# Patient Record
Sex: Male | Born: 1945 | Race: Black or African American | Hispanic: No | Marital: Married
Health system: Southern US, Community
[De-identification: ages and names within clinical notes are randomized; demographics above are authoritative.]

## PROBLEM LIST (undated history)

## (undated) DIAGNOSIS — J9621 Acute and chronic respiratory failure with hypoxia: Secondary | ICD-10-CM

## (undated) DIAGNOSIS — N179 Acute kidney failure, unspecified: Secondary | ICD-10-CM

## (undated) DIAGNOSIS — N183 Chronic kidney disease, stage 3 unspecified: Secondary | ICD-10-CM

## (undated) DIAGNOSIS — I5043 Acute on chronic combined systolic (congestive) and diastolic (congestive) heart failure: Secondary | ICD-10-CM

## (undated) DIAGNOSIS — J189 Pneumonia, unspecified organism: Secondary | ICD-10-CM

## (undated) DIAGNOSIS — J449 Chronic obstructive pulmonary disease, unspecified: Secondary | ICD-10-CM

---

## 2019-02-15 ENCOUNTER — Inpatient Hospital Stay
Admission: RE | Admit: 2019-02-15 | Discharge: 2019-04-21 | Disposition: A | Discharge status: Skilled Nursing Facility | Payer: Medicare Other | Source: Other Acute Inpatient Hospital | Attending: Internal Medicine | Admitting: Internal Medicine

## 2019-02-15 ENCOUNTER — Other Ambulatory Visit (HOSPITAL_COMMUNITY): Payer: Medicare Other

## 2019-02-15 DIAGNOSIS — N179 Acute kidney failure, unspecified: Secondary | ICD-10-CM | POA: Diagnosis present

## 2019-02-15 DIAGNOSIS — J90 Pleural effusion, not elsewhere classified: Secondary | ICD-10-CM

## 2019-02-15 DIAGNOSIS — J189 Pneumonia, unspecified organism: Secondary | ICD-10-CM | POA: Diagnosis present

## 2019-02-15 DIAGNOSIS — I770 Arteriovenous fistula, acquired: Secondary | ICD-10-CM

## 2019-02-15 DIAGNOSIS — J449 Chronic obstructive pulmonary disease, unspecified: Secondary | ICD-10-CM | POA: Diagnosis present

## 2019-02-15 DIAGNOSIS — J811 Chronic pulmonary edema: Secondary | ICD-10-CM

## 2019-02-15 DIAGNOSIS — J939 Pneumothorax, unspecified: Secondary | ICD-10-CM

## 2019-02-15 DIAGNOSIS — Z992 Dependence on renal dialysis: Secondary | ICD-10-CM

## 2019-02-15 DIAGNOSIS — R509 Fever, unspecified: Secondary | ICD-10-CM

## 2019-02-15 DIAGNOSIS — I509 Heart failure, unspecified: Secondary | ICD-10-CM

## 2019-02-15 DIAGNOSIS — I5043 Acute on chronic combined systolic (congestive) and diastolic (congestive) heart failure: Secondary | ICD-10-CM | POA: Diagnosis present

## 2019-02-15 DIAGNOSIS — J9621 Acute and chronic respiratory failure with hypoxia: Secondary | ICD-10-CM | POA: Diagnosis present

## 2019-02-15 DIAGNOSIS — J9690 Respiratory failure, unspecified, unspecified whether with hypoxia or hypercapnia: Secondary | ICD-10-CM

## 2019-02-15 DIAGNOSIS — K269 Duodenal ulcer, unspecified as acute or chronic, without hemorrhage or perforation: Secondary | ICD-10-CM

## 2019-02-15 DIAGNOSIS — Z431 Encounter for attention to gastrostomy: Secondary | ICD-10-CM

## 2019-02-15 DIAGNOSIS — Z9911 Dependence on respirator [ventilator] status: Secondary | ICD-10-CM

## 2019-02-15 DIAGNOSIS — D500 Iron deficiency anemia secondary to blood loss (chronic): Secondary | ICD-10-CM

## 2019-02-15 DIAGNOSIS — Z9889 Other specified postprocedural states: Secondary | ICD-10-CM

## 2019-02-15 HISTORY — DX: Chronic obstructive pulmonary disease, unspecified: J44.9

## 2019-02-15 HISTORY — DX: Acute kidney failure, unspecified: N17.9

## 2019-02-15 HISTORY — DX: Acute and chronic respiratory failure with hypoxia: J96.21

## 2019-02-15 HISTORY — DX: Acute on chronic combined systolic (congestive) and diastolic (congestive) heart failure: I50.43

## 2019-02-15 HISTORY — DX: Chronic kidney disease, stage 3 unspecified: N18.30

## 2019-02-15 HISTORY — DX: Pneumonia, unspecified organism: J18.9

## 2019-02-15 LAB — BLOOD GAS, ARTERIAL
Acid-Base Excess: 3.3 mmol/L — ABNORMAL HIGH (ref 0.0–2.0)
Bicarbonate: 27.8 mmol/L (ref 20.0–28.0)
FIO2: 35
O2 Saturation: 81.7 %
PEEP: 8 cmH2O
Patient temperature: 98.6
RATE: 16 resp/min
pCO2 arterial: 46.3 mmHg (ref 32.0–48.0)
pH, Arterial: 7.396 (ref 7.350–7.450)
pO2, Arterial: 51.8 mmHg — ABNORMAL LOW (ref 83.0–108.0)

## 2019-02-15 MED ORDER — IOHEXOL 300 MG/ML  SOLN: 40.0000 mL | Freq: Once | INTRAMUSCULAR | Status: DC | PRN

## 2019-02-16 ENCOUNTER — Other Ambulatory Visit (HOSPITAL_COMMUNITY): Payer: Medicare Other

## 2019-02-16 DIAGNOSIS — J9621 Acute and chronic respiratory failure with hypoxia: Secondary | ICD-10-CM

## 2019-02-16 DIAGNOSIS — N179 Acute kidney failure, unspecified: Secondary | ICD-10-CM | POA: Diagnosis not present

## 2019-02-16 DIAGNOSIS — J189 Pneumonia, unspecified organism: Secondary | ICD-10-CM

## 2019-02-16 DIAGNOSIS — N183 Chronic kidney disease, stage 3 unspecified: Secondary | ICD-10-CM

## 2019-02-16 DIAGNOSIS — I5043 Acute on chronic combined systolic (congestive) and diastolic (congestive) heart failure: Secondary | ICD-10-CM | POA: Diagnosis not present

## 2019-02-16 DIAGNOSIS — J449 Chronic obstructive pulmonary disease, unspecified: Secondary | ICD-10-CM | POA: Diagnosis not present

## 2019-02-16 LAB — HEMOGLOBIN A1C
Hgb A1c MFr Bld: 6.7 % — ABNORMAL HIGH (ref 4.8–5.6)
Mean Plasma Glucose: 145.59 mg/dL

## 2019-02-16 LAB — COMPREHENSIVE METABOLIC PANEL
ALT: 15 U/L (ref 0–44)
AST: 21 U/L (ref 15–41)
Albumin: 1.9 g/dL — ABNORMAL LOW (ref 3.5–5.0)
Alkaline Phosphatase: 112 U/L (ref 38–126)
Anion gap: 15 (ref 5–15)
BUN: 152 mg/dL — ABNORMAL HIGH (ref 8–23)
CO2: 25 mmol/L (ref 22–32)
Calcium: 9 mg/dL (ref 8.9–10.3)
Chloride: 102 mmol/L (ref 98–111)
Creatinine, Ser: 4.54 mg/dL — ABNORMAL HIGH (ref 0.61–1.24)
GFR calc Af Amer: 14 mL/min — ABNORMAL LOW (ref >60)
GFR calc non Af Amer: 12 mL/min — ABNORMAL LOW (ref >60)
Glucose, Bld: 182 mg/dL — ABNORMAL HIGH (ref 70–99)
Potassium: 4.9 mmol/L (ref 3.5–5.1)
Sodium: 142 mmol/L (ref 135–145)
Total Bilirubin: 1.1 mg/dL (ref 0.3–1.2)
Total Protein: 6 g/dL — ABNORMAL LOW (ref 6.5–8.1)

## 2019-02-16 LAB — CBC WITH DIFFERENTIAL/PLATELET
Abs Immature Granulocytes: 0.04 10*3/uL (ref 0.00–0.07)
Basophils Absolute: 0 10*3/uL (ref 0.0–0.1)
Basophils Relative: 0 %
Eosinophils Absolute: 0 10*3/uL (ref 0.0–0.5)
Eosinophils Relative: 0 %
HCT: 27 % — ABNORMAL LOW (ref 39.0–52.0)
Hemoglobin: 8.6 g/dL — ABNORMAL LOW (ref 13.0–17.0)
Immature Granulocytes: 1 %
Lymphocytes Relative: 8 %
Lymphs Abs: 0.7 10*3/uL (ref 0.7–4.0)
MCH: 27.5 pg (ref 26.0–34.0)
MCHC: 31.9 g/dL (ref 30.0–36.0)
MCV: 86.3 fL (ref 80.0–100.0)
Monocytes Absolute: 0.5 10*3/uL (ref 0.1–1.0)
Monocytes Relative: 6 %
Neutro Abs: 7.4 10*3/uL (ref 1.7–7.7)
Neutrophils Relative %: 85 %
Platelets: 185 10*3/uL (ref 150–400)
RBC: 3.13 MIL/uL — ABNORMAL LOW (ref 4.22–5.81)
RDW: 15.6 % — ABNORMAL HIGH (ref 11.5–15.5)
WBC: 8.6 10*3/uL (ref 4.0–10.5)
nRBC: 0 % (ref 0.0–0.2)

## 2019-02-16 LAB — URINALYSIS, ROUTINE W REFLEX MICROSCOPIC
Bilirubin Urine: NEGATIVE
Glucose, UA: NEGATIVE mg/dL
Ketones, ur: NEGATIVE mg/dL
Leukocytes,Ua: NEGATIVE
Nitrite: NEGATIVE
Protein, ur: 100 mg/dL — AB
Specific Gravity, Urine: 1.014 (ref 1.005–1.030)
pH: 5 (ref 5.0–8.0)

## 2019-02-16 LAB — MAGNESIUM: Magnesium: 3.2 mg/dL — ABNORMAL HIGH (ref 1.7–2.4)

## 2019-02-16 LAB — PROTIME-INR
INR: 1.3 — ABNORMAL HIGH (ref 0.8–1.2)
Prothrombin Time: 15.7 seconds — ABNORMAL HIGH (ref 11.4–15.2)

## 2019-02-16 LAB — PHOSPHORUS: Phosphorus: 5.2 mg/dL — ABNORMAL HIGH (ref 2.5–4.6)

## 2019-02-16 LAB — TSH: TSH: 2.629 u[IU]/mL (ref 0.350–4.500)

## 2019-02-16 LAB — SARS CORONAVIRUS 2 BY RT PCR (HOSPITAL ORDER, PERFORMED IN ~~LOC~~ HOSPITAL LAB): SARS Coronavirus 2: NEGATIVE

## 2019-02-16 MED ORDER — GENERIC EXTERNAL MEDICATION
0.04 | Status: DC
Start: ? — End: 2019-02-16

## 2019-02-16 MED ORDER — ACETAMINOPHEN 325 MG PO TABS
650.00 | ORAL_TABLET | ORAL | Status: DC
Start: ? — End: 2019-02-16

## 2019-02-16 MED ORDER — INSULIN LISPRO 100 UNIT/ML ~~LOC~~ SOLN
1.00 | SUBCUTANEOUS | Status: DC
Start: ? — End: 2019-02-16

## 2019-02-16 MED ORDER — BISACODYL 10 MG RE SUPP
10.00 | RECTAL | Status: DC
Start: 2019-02-15 — End: 2019-02-16

## 2019-02-16 MED ORDER — SODIUM CHLORIDE 0.9 % IV SOLN
10.00 | INTRAVENOUS | Status: DC
Start: ? — End: 2019-02-16

## 2019-02-16 MED ORDER — INSULIN LISPRO 100 UNIT/ML ~~LOC~~ SOLN
1.00 | SUBCUTANEOUS | Status: DC
Start: 2019-02-15 — End: 2019-02-16

## 2019-02-16 MED ORDER — GENERIC EXTERNAL MEDICATION
Status: DC
Start: ? — End: 2019-02-16

## 2019-02-16 MED ORDER — IPRATROPIUM-ALBUTEROL 0.5-2.5 (3) MG/3ML IN SOLN
3.00 | RESPIRATORY_TRACT | Status: DC
Start: ? — End: 2019-02-16

## 2019-02-16 MED ORDER — METOPROLOL TARTRATE 25 MG PO TABS
12.50 | ORAL_TABLET | ORAL | Status: DC
Start: 2019-02-15 — End: 2019-02-16

## 2019-02-16 MED ORDER — HYDROCORTISONE NA SUCCINATE PF 100 MG IJ SOLR
50.00 | INTRAMUSCULAR | Status: DC
Start: 2019-02-16 — End: 2019-02-16

## 2019-02-16 MED ORDER — SODIUM CHLORIDE (PF) 0.9 % IJ SOLN
10.00 | INTRAMUSCULAR | Status: DC
Start: 2019-02-15 — End: 2019-02-16

## 2019-02-16 MED ORDER — FENTANYL CITRATE (PF) 2500 MCG/50ML IJ SOLN
50.00 | INTRAMUSCULAR | Status: DC
Start: ? — End: 2019-02-16

## 2019-02-16 MED ORDER — DONEPEZIL HCL 5 MG PO TABS
10.00 | ORAL_TABLET | ORAL | Status: DC
Start: 2019-02-15 — End: 2019-02-16

## 2019-02-16 MED ORDER — IPRATROPIUM-ALBUTEROL 0.5-2.5 (3) MG/3ML IN SOLN
3.00 | RESPIRATORY_TRACT | Status: DC
Start: 2019-02-15 — End: 2019-02-16

## 2019-02-16 MED ORDER — ACETAMINOPHEN 160 MG/5ML PO SUSP
650.00 | ORAL | Status: DC
Start: ? — End: 2019-02-16

## 2019-02-16 MED ORDER — FENTANYL CITRATE (PF) 2500 MCG/50ML IJ SOLN
100.00 | INTRAMUSCULAR | Status: DC
Start: ? — End: 2019-02-16

## 2019-02-16 MED ORDER — ENOXAPARIN SODIUM 30 MG/0.3ML ~~LOC~~ SOLN
30.00 | SUBCUTANEOUS | Status: DC
Start: 2019-02-15 — End: 2019-02-16

## 2019-02-16 MED ORDER — NYSTATIN 100000 UNIT/ML MT SUSP
500000.00 | OROMUCOSAL | Status: DC
Start: 2019-02-15 — End: 2019-02-16

## 2019-02-16 MED ORDER — ISOSORBIDE DINITRATE 20 MG PO TABS
20.00 | ORAL_TABLET | ORAL | Status: DC
Start: ? — End: 2019-02-16

## 2019-02-16 MED ORDER — DOCUSATE SODIUM 150 MG/15ML PO LIQD
100.00 | ORAL | Status: DC
Start: 2019-02-15 — End: 2019-02-16

## 2019-02-16 MED ORDER — GENERIC EXTERNAL MEDICATION
0.08 | Status: DC
Start: ? — End: 2019-02-16

## 2019-02-16 MED ORDER — PROPOFOL 200 MG/20ML IV EMUL
1.00 | INTRAVENOUS | Status: DC
Start: ? — End: 2019-02-16

## 2019-02-16 MED ORDER — LORAZEPAM 2 MG/ML PO CONC
1.00 | ORAL | Status: DC
Start: ? — End: 2019-02-16

## 2019-02-16 MED ORDER — INSULIN GLARGINE 100 UNIT/ML ~~LOC~~ SOLN
1.00 | SUBCUTANEOUS | Status: DC
Start: ? — End: 2019-02-16

## 2019-02-16 MED ORDER — FENTANYL CITRATE-NACL 2.5-0.9 MG/250ML-% IV SOLN
1.00 | INTRAVENOUS | Status: DC
Start: ? — End: 2019-02-16

## 2019-02-16 MED ORDER — FENTANYL CITRATE (PF) 2500 MCG/50ML IJ SOLN
25.00 | INTRAMUSCULAR | Status: DC
Start: ? — End: 2019-02-16

## 2019-02-16 MED ORDER — INSULIN GLARGINE 100 UNIT/ML ~~LOC~~ SOLN
1.00 | SUBCUTANEOUS | Status: DC
Start: 2019-02-15 — End: 2019-02-16

## 2019-02-16 MED ORDER — ACETAMINOPHEN 650 MG RE SUPP
650.00 | RECTAL | Status: DC
Start: ? — End: 2019-02-16

## 2019-02-16 MED ORDER — LABETALOL HCL 5 MG/ML IV SOLN
10.00 | INTRAVENOUS | Status: DC
Start: ? — End: 2019-02-16

## 2019-02-16 NOTE — Consult Note (Signed)
[facility]  Date of Service: 02/16/2019  PULMONARY CRITICAL CARE CONSULT   Paul Ball  [id number]  DOB: 1945/09/26   DOA: 02/15/2019  Referring Physician: [name], MD  HPI: Krew Amadio is a 73 y.o. male seen for follow up of Acute on Chronic Respiratory Failure.  Patient has multiple medical problems including systolic and diastolic heart failure history of stroke carotid artery stenosis COPD diabetes depression BPH has been having admissions to the hospital was recently admitted for respiratory failure and treated and discharged.  At that time chest x-ray had shown a right lower lobe pneumonia and effusion.  The patient was treated in the ICU for volume overload.  Patient was discharged August 23 at that time also had been found to have a stroke and heart failure.  Patient was admitted this time around into the ICU on the ventilator for respiratory failure.  Started on IV antibiotics steroids and bronchodilators.  Patient had spontaneous breathing trials which the patient failed and increased work of breathing shortness of breath and ended up having to go back on the ventilator.  Subsequently self extubated had increased work of breathing was placed on BiPAP however did not tolerate for prolonged period of time and is ended up back on the ventilator.  Subsequently patient was found to not be able to wean so therefore had a tracheostomy done and was transferred to our facility for further management.  At the time the patient is seen is on full support without any distress  Review of Systems:  ROS performed and is unremarkable other than noted above.  Past Medical History:  Diagnosis Date  . Abnormal EKG 10/26/2012  Questionable old anterioseptal infarct?  . Acid reflux disease 09/29/2005  With history of H. pylori  . Allergic rhinitis 09/30/2011  . Anemia 09/30/2011  . Anemia, unspecified 09/30/2011  . Benign  prostatic hyperplasia with weak urinary stream 09/30/2011  10/1 IMO update  . Benign prostatic hypertrophy (BPH) with weak urinary stream 09/30/2011  . BPH (benign prostatic hypertrophy) 09/30/2011  . Chronic nonseasonal allergic rhinitis due to pollen 09/30/2011  . Cognitive disorder 07/10/2016  . COPD (chronic obstructive pulmonary disease) (*) 09/30/2007  . COPD mixed type (*) 05/07/2018  . Diabetes mellitus (*) 09/30/2011  . 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 Diabetes type 2, controlled (*) 10/26/2012  . Essential hypertension with goal blood pressure less than 130/85 09/30/2011  . Gastroesophageal reflux disease without esophagitis 09/29/2005  With history of H. pylori  . Hemorrhoids 09/30/2011  . History of adenomatous polyp of colon 07/17/2016  Colonoscopy 2018 by Dr Dalbert Garnet with adenomas. Repeat colonoscopy 2023 for surveillance  . Hypertension 09/30/2011  . Hypertension associated with diabetes (*) 09/30/2011  . Medicare annual wellness visit, initial 10/26/2012  10-26-12  . Missing teeth, acquired  . Nicotine use disorder 09/30/2011  . Open-angle glaucoma 09/30/2011  . Seborrheic dermatitis 09/30/2011  . Tobacco use disorder 09/30/2011  . 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 Harl Bowie May 2016  . Type 2 diabetes mellitus with microalbuminuria (*) 04/17/2014  . Type 2 diabetes mellitus with microalbuminuria, with long-term current use of insulin (*) 04/17/2014  . Type 2 diabetes mellitus with mild nonproliferative retinopathy without macular edema, with long-term current use of insulin (*) 10/26/2012  Dr. Harl Bowie May 2016  Past Surgical History:  Procedure Laterality Date  . Colonoscopy  . Eye surgery  left cataract surgery in 2011, right cataract surgery Dr. Harl Bowie   Allergies  Allergen  Reactions  . Shellfish Allergy Anaphylaxis     Medications: Reviewed on Rounds  Physical Exam:  Vitals: Temperature 97.4 pulse 83 respiratory 22 blood pressure 160/55 saturations 95%  Ventilator Settings mode ventilation pressure assist control FiO2 35% tidal volume 314 PEEP 6  . General: Comfortable at this time . Eyes: Grossly normal lids, irises & conjunctiva . ENT: grossly tongue is normal . Neck: no obvious mass . Cardiovascular: S1-S2 normal no gallop or rub . Respiratory: No rhonchi no rales are noted . Abdomen: Soft nontender . Skin: no rash seen on limited exam . Musculoskeletal: not rigid . Psychiatric:unable to assess . Neurologic: no seizure no involuntary movements         Labs on Admission:  Basic Metabolic Panel: Recent Labs  Lab 02/16/19 0658  NA 142  K 4.9  CL 102  CO2 25  GLUCOSE 182*  BUN 152*  CREATININE 4.54*  CALCIUM 9.0  MG 3.2*  PHOS 5.2*    Recent Labs  Lab 02/15/19 1930  PHART 7.396  PCO2ART 46.3  PO2ART 51.8*  HCO3 27.8  O2SAT 81.7    Liver Function Tests: Recent Labs  Lab 02/16/19 0658  AST 21  ALT 15  ALKPHOS 112  BILITOT 1.1  PROT 6.0*  ALBUMIN 1.9*   No results for input(s): LIPASE, AMYLASE in the last 168 hours. No results for input(s): AMMONIA in the last 168 hours.  CBC: Recent Labs  Lab 02/16/19 0658  WBC 8.6  NEUTROABS 7.4  HGB 8.6*  HCT 27.0*  MCV 86.3  PLT 185    Cardiac Enzymes: No results for input(s): CKTOTAL, CKMB, CKMBINDEX, TROPONINI in the last 168 hours.  BNP (last 3 results) No results for input(s): BNP in the last 8760 hours.  ProBNP (last 3 results) No results for input(s): PROBNP in the last 8760 hours.   Radiological Exams on Admission: Dg Abd 1 View  Result Date: 02/15/2019 CLINICAL DATA:  73 year old male with respiratory failure. EXAM: PORTABLE CHEST 1 VIEW COMPARISON:  None. FINDINGS: Tracheostomy with tip approximately 6 cm above the carina. There are small to moderate  bilateral pleural effusions with associated bilateral lower lobe atelectasis versus infiltrate. Mild diffuse interstitial prominence may represent vascular congestion. There is no pneumothorax. There is mild cardiomegaly. Percutaneous gastrostomy with tip over the body of the stomach. Injected contrast is noted in the second portion of the duodenum as well as within the colon. The osseous structures and soft tissues are grossly unremarkable. There is silhouetting of the abdominal structures which may represent ascites. IMPRESSION: 1. Small to moderate bilateral pleural effusions with associated bilateral lower lobe atelectasis versus infiltrate. 2. Mild cardiomegaly with mild vascular congestion. 3. Percutaneous gastrostomy with tip over the body of the stomach. Electronically Signed   By: Anner Crete M.D.   On: 02/15/2019 19:59   Dg Chest Port 1 View  Result Date: 02/16/2019 CLINICAL DATA:  Pleural effusion. EXAM: PORTABLE CHEST 1 VIEW COMPARISON:  February 15, 2019. FINDINGS: Stable cardiomediastinal silhouette. Tracheostomy tube is unchanged in position. Stable bilateral lung opacities are noted concerning for edema or infiltrates with associated pleural effusions, right greater than left. Bony thorax is unremarkable. IMPRESSION: Stable bilateral lung opacities are noted concerning for edema or infiltrates with associated pleural effusions, right greater than left. Electronically Signed   By: Jeneen Rinks  Murlean Caller M.D.   On: 02/16/2019 12:53   Dg Chest Port 1 View  Result Date: 02/15/2019 CLINICAL DATA:  73 year old male with respiratory failure. EXAM: PORTABLE CHEST 1 VIEW COMPARISON:  None. FINDINGS: Tracheostomy with tip approximately 6 cm above the carina. There are small to moderate bilateral pleural effusions with associated bilateral lower lobe atelectasis versus infiltrate. Mild diffuse interstitial prominence may represent vascular congestion. There is no pneumothorax. There is mild cardiomegaly.  Percutaneous gastrostomy with tip over the body of the stomach. Injected contrast is noted in the second portion of the duodenum as well as within the colon. The osseous structures and soft tissues are grossly unremarkable. There is silhouetting of the abdominal structures which may represent ascites. IMPRESSION: 1. Small to moderate bilateral pleural effusions with associated bilateral lower lobe atelectasis versus infiltrate. 2. Mild cardiomegaly with mild vascular congestion. 3. Percutaneous gastrostomy with tip over the body of the stomach. Electronically Signed   By: Anner Crete M.D.   On: 02/15/2019 19:59    Assessment/Plan Active Problems:   Acute on chronic respiratory failure with hypoxia (HCC)   Acute on chronic systolic and diastolic heart failure, NYHA class 3 (HCC)   Acute renal failure superimposed on stage 3 chronic kidney disease (HCC)   Healthcare-associated pneumonia   COPD, severe ([place])   1. Acute on chronic respiratory failure with hypoxia we will continue with full support on pressure control mode patient currently is on 35% FiO2 respiratory therapy will assess the RSB I and mechanics. 2. Acute on chronic systolic and diastolic heart failure.  Need to monitor fluid status very closely.  Most recent chest x-ray showing moderate pleural effusions with some atelectasis.  Diuresis tolerated. 3. Acute on chronic kidney disease stage III we will continue with supportive care monitor fluid status closely as already mentioned. 4. Healthcare associated pneumonia patient was treated with Flagyl Zosyn cefepime and vancomycin follow radiologically. 5. Severe COPD we will continue with supportive care and nebulizers as deemed necessary  I have personally seen and evaluated the patient, evaluated laboratory and imaging results, formulated the assessment and plan and placed orders. The Patient requires high complexity decision making for assessment and support.  Case was discussed on  Rounds with the Respiratory Therapy Staff Time Spent 60minutes  Allyne Gee, MD [facility] Pulmonary Critical Care Medicine Sleep Medicine

## 2019-02-17 ENCOUNTER — Other Ambulatory Visit (HOSPITAL_COMMUNITY): Payer: Medicare Other

## 2019-02-17 ENCOUNTER — Encounter: Payer: Self-pay | Admitting: Internal Medicine

## 2019-02-17 DIAGNOSIS — N179 Acute kidney failure, unspecified: Secondary | ICD-10-CM | POA: Diagnosis present

## 2019-02-17 DIAGNOSIS — J449 Chronic obstructive pulmonary disease, unspecified: Secondary | ICD-10-CM | POA: Diagnosis not present

## 2019-02-17 DIAGNOSIS — J9621 Acute and chronic respiratory failure with hypoxia: Secondary | ICD-10-CM | POA: Diagnosis not present

## 2019-02-17 DIAGNOSIS — I5043 Acute on chronic combined systolic (congestive) and diastolic (congestive) heart failure: Secondary | ICD-10-CM | POA: Diagnosis present

## 2019-02-17 DIAGNOSIS — J189 Pneumonia, unspecified organism: Secondary | ICD-10-CM | POA: Diagnosis present

## 2019-02-17 DIAGNOSIS — N183 Chronic kidney disease, stage 3 unspecified: Secondary | ICD-10-CM | POA: Diagnosis present

## 2019-02-17 LAB — RENAL FUNCTION PANEL
Albumin: 1.8 g/dL — ABNORMAL LOW (ref 3.5–5.0)
Anion gap: 11 (ref 5–15)
BUN: 160 mg/dL — ABNORMAL HIGH (ref 8–23)
CO2: 28 mmol/L (ref 22–32)
Calcium: 9 mg/dL (ref 8.9–10.3)
Chloride: 103 mmol/L (ref 98–111)
Creatinine, Ser: 4.95 mg/dL — ABNORMAL HIGH (ref 0.61–1.24)
GFR calc Af Amer: 13 mL/min — ABNORMAL LOW (ref >60)
GFR calc non Af Amer: 11 mL/min — ABNORMAL LOW (ref >60)
Glucose, Bld: 200 mg/dL — ABNORMAL HIGH (ref 70–99)
Phosphorus: 6.6 mg/dL — ABNORMAL HIGH (ref 2.5–4.6)
Potassium: 5.1 mmol/L (ref 3.5–5.1)
Sodium: 142 mmol/L (ref 135–145)

## 2019-02-17 LAB — URINE CULTURE: Culture: NO GROWTH

## 2019-02-17 LAB — CBC
HCT: 28.4 % — ABNORMAL LOW (ref 39.0–52.0)
Hemoglobin: 8.6 g/dL — ABNORMAL LOW (ref 13.0–17.0)
MCH: 27.6 pg (ref 26.0–34.0)
MCHC: 30.3 g/dL (ref 30.0–36.0)
MCV: 91 fL (ref 80.0–100.0)
Platelets: 224 10*3/uL (ref 150–400)
RBC: 3.12 MIL/uL — ABNORMAL LOW (ref 4.22–5.81)
RDW: 15.4 % (ref 11.5–15.5)
WBC: 9.5 10*3/uL (ref 4.0–10.5)
nRBC: 0 % (ref 0.0–0.2)

## 2019-02-17 LAB — MAGNESIUM: Magnesium: 3.6 mg/dL — ABNORMAL HIGH (ref 1.7–2.4)

## 2019-02-17 MED ORDER — GENERIC EXTERNAL MEDICATION
Status: DC
Start: ? — End: 2019-02-17

## 2019-02-17 NOTE — Progress Notes (Signed)
2D Echocardiogram has been completed.  Arwyn Besaw, RCS 

## 2019-02-17 NOTE — Progress Notes (Signed)
Pulmonary Critical Care Medicine [place]   PULMONARY CRITICAL CARE SERVICE  PROGRESS NOTE  Date of Service: 02/17/2019  Paul Ball  [id number]  DOB: 01-24-1946   DOA: 02/15/2019  Referring Physician: [name], MD  HPI: Paul Ball is a 73 y.o. male seen for follow up of Acute on Chronic Respiratory Failure.  Patient currently is on full support on assist control mode has been on 35% FiO2 completed wean on pressure support this morning with good results  Medications: Reviewed on Rounds  Physical Exam:  Vitals: Temperature 97.3 pulse 66 respiratory 26 blood pressure 142/62 saturations 98%  Ventilator Settings mode ventilation assist control FiO2 35% tidal volume 466 PEEP 5  . General: Comfortable at this time . Eyes: Grossly normal lids, irises & conjunctiva . ENT: grossly tongue is normal . Neck: no obvious mass . Cardiovascular: S1 S2 normal no gallop . Respiratory: No rhonchi coarse breath sounds are noted at this time . Abdomen: soft . Skin: no rash seen on limited exam . Musculoskeletal: not rigid . Psychiatric:unable to assess . Neurologic: no seizure no involuntary movements         Lab Data:   Basic Metabolic Panel: Recent Labs  Lab 02/16/19 0658 02/17/19 0806  NA 142 142  K 4.9 5.1  CL 102 103  CO2 25 28  GLUCOSE 182* 200*  BUN 152* 160*  CREATININE 4.54* 4.95*  CALCIUM 9.0 9.0  MG 3.2* 3.6*  PHOS 5.2* 6.6*    ABG: Recent Labs  Lab 02/15/19 1930  PHART 7.396  PCO2ART 46.3  PO2ART 51.8*  HCO3 27.8  O2SAT 81.7    Liver Function Tests: Recent Labs  Lab 02/16/19 0658 02/17/19 0806  AST 21  --   ALT 15  --   ALKPHOS 112  --   BILITOT 1.1  --   PROT 6.0*  --   ALBUMIN 1.9* 1.8*   No results for input(s): LIPASE, AMYLASE in the last 168 hours. No results for input(s): AMMONIA in the last 168 hours.  CBC: Recent Labs  Lab 02/16/19 0658 02/17/19 0806  WBC 8.6 9.5  NEUTROABS 7.4  --   HGB 8.6* 8.6*   HCT 27.0* 28.4*  MCV 86.3 91.0  PLT 185 224    Cardiac Enzymes: No results for input(s): CKTOTAL, CKMB, CKMBINDEX, TROPONINI in the last 168 hours.  BNP (last 3 results) No results for input(s): BNP in the last 8760 hours.  ProBNP (last 3 results) No results for input(s): PROBNP in the last 8760 hours.  Radiological Exams: Dg Abd 1 View  Result Date: 02/15/2019 CLINICAL DATA:  73 year old male with respiratory failure. EXAM: PORTABLE CHEST 1 VIEW COMPARISON:  None. FINDINGS: Tracheostomy with tip approximately 6 cm above the carina. There are small to moderate bilateral pleural effusions with associated bilateral lower lobe atelectasis versus infiltrate. Mild diffuse interstitial prominence may represent vascular congestion. There is no pneumothorax. There is mild cardiomegaly. Percutaneous gastrostomy with tip over the body of the stomach. Injected contrast is noted in the second portion of the duodenum as well as within the colon. The osseous structures and soft tissues are grossly unremarkable. There is silhouetting of the abdominal structures which may represent ascites. IMPRESSION: 1. Small to moderate bilateral pleural effusions with associated bilateral lower lobe atelectasis versus infiltrate. 2. Mild cardiomegaly with mild vascular congestion. 3. Percutaneous gastrostomy with tip over the body of the stomach. Electronically Signed   By: Anner Crete M.D.   On: 02/15/2019 19:59  US Renal  Result Date: 02/17/2019 CLINICAL DATA:  Acute kidney injury. EXAM: RENAL / URINARY TRACT ULTRASOUND COMPLETE COMPARISON:  None. FINDINGS: Right Kidney: Renal measurements: 9.6 x 4.4 x 3.8 cm = volume: 85 mL. Increased echogenicity of renal parenchyma is noted. No mass or hydronephrosis visualized. Left Kidney: Renal measurements: 10.0 x 5.0 x 4.9 cm = volume: 127 mL. Increased echogenicity of renal parenchyma is noted. No mass or hydronephrosis visualized. Bladder: Not visualized as bladder is  decompressed secondary to catheter. Other: Mild ascites is noted. IMPRESSION: Increased echogenicity of renal parenchyma is noted bilaterally suggesting medical renal disease. No other renal abnormality is noted. Mild ascites. Electronically Signed   By: Marijo Conception M.D.   On: 02/17/2019 08:50   Dg Chest Port 1 View  Result Date: 02/16/2019 CLINICAL DATA:  Pleural effusion. EXAM: PORTABLE CHEST 1 VIEW COMPARISON:  February 15, 2019. FINDINGS: Stable cardiomediastinal silhouette. Tracheostomy tube is unchanged in position. Stable bilateral lung opacities are noted concerning for edema or infiltrates with associated pleural effusions, right greater than left. Bony thorax is unremarkable. IMPRESSION: Stable bilateral lung opacities are noted concerning for edema or infiltrates with associated pleural effusions, right greater than left. Electronically Signed   By: Marijo Conception M.D.   On: 02/16/2019 12:53   Dg Chest Port 1 View  Result Date: 02/15/2019 CLINICAL DATA:  73 year old male with respiratory failure. EXAM: PORTABLE CHEST 1 VIEW COMPARISON:  None. FINDINGS: Tracheostomy with tip approximately 6 cm above the carina. There are small to moderate bilateral pleural effusions with associated bilateral lower lobe atelectasis versus infiltrate. Mild diffuse interstitial prominence may represent vascular congestion. There is no pneumothorax. There is mild cardiomegaly. Percutaneous gastrostomy with tip over the body of the stomach. Injected contrast is noted in the second portion of the duodenum as well as within the colon. The osseous structures and soft tissues are grossly unremarkable. There is silhouetting of the abdominal structures which may represent ascites. IMPRESSION: 1. Small to moderate bilateral pleural effusions with associated bilateral lower lobe atelectasis versus infiltrate. 2. Mild cardiomegaly with mild vascular congestion. 3. Percutaneous gastrostomy with tip over the body of the  stomach. Electronically Signed   By: Anner Crete M.D.   On: 02/15/2019 19:59    Assessment/Plan Active Problems:   Acute on chronic respiratory failure with hypoxia (HCC)   Acute on chronic systolic and diastolic heart failure, NYHA class 3 (HCC)   Acute renal failure superimposed on stage 3 chronic kidney disease (HCC)   Healthcare-associated pneumonia   COPD, severe ([place])   1. Acute on chronic respiratory failure hypoxia continue to advance the wean as tolerated patient was able to complete today's goals without any issue 2. Acute on chronic systolic heart failure continue with supportive care diuretics as necessary 3. Acute on chronic kidney disease stage III we will continue with monitoring labs as well as monitoring fluid status 4. Healthcare associated pneumonia treated extensively as previously noted on the HPI 5. Severe COPD nebulizers as necessary   I have personally seen and evaluated the patient, evaluated laboratory and imaging results, formulated the assessment and plan and placed orders. The Patient requires high complexity decision making for assessment and support.  Case was discussed on Rounds with the Respiratory Therapy Staff  Allyne Gee, MD [facility] Pulmonary Critical Care Medicine Sleep Medicine

## 2019-02-17 NOTE — H&P (Signed)
Referring Physician:  Rakesh Shuford is an 73 y.o. male.                       Chief Complaint: Bradycardia  HPI: 73 years old black male with h/o of acute on chronic respiratory failure, stroke, COPD, CKD, V,  PVD, type 2 DM has episodes of sinus bradycardia without significant drop in blood pressure. Patient is bed ridden and non-communicative hence it is not possible to ascertain if he has any symptoms like dizziness. His echocardiogram done today shows preserved LV systolic function with mild to moderate AS, mild AI, mild MR, TR and trivial pericardial effusion but large bilateral pleural effusions.    Past Medical History:  Diagnosis Date  . Acute on chronic respiratory failure with hypoxia ([place])   . Acute on chronic systolic and diastolic heart failure, NYHA class 3 ([place])   . Acute renal failure superimposed on stage 3 chronic kidney disease ([place])   . COPD, severe ([place])   . Healthcare-associated pneumonia       The histories are not reviewed yet. Please review them in the "History" navigator section and refresh this [place].  No family history on file. Social History:  has history of tobacco use disorder, alcohol use. No drug.  Allergies: Shell fish, Anaphylaxis  No medications prior to admission.  See list on med file.  Results for orders placed or performed during the hospital encounter of 02/15/19 (from the past 48 hour(s))  Blood gas, arterial     Status: Abnormal   Collection Time: 02/15/19  7:30 PM  Result Value Ref Range   FIO2 35.00    Delivery systems VENTILATOR    Mode PRESSURE CONTROL    LHR 16 resp/min   Peep/cpap 8.0 cm H20   pH, Arterial 7.396 7.350 - 7.450   pCO2 arterial 46.3 32.0 - 48.0 mmHg   pO2, Arterial 51.8 (L) 83.0 - 108.0 mmHg   Bicarbonate 27.8 20.0 - 28.0 mmol/L   Acid-Base Excess 3.3 (H) 0.0 - 2.0 mmol/L   O2 Saturation 81.7 %   Patient temperature 98.6    Collection site LEFT RADIAL    Drawn by COLLECTED BY RT    Sample type ARTERIAL DRAW     Allens test (pass/fail) PASS PASS  Culture, respiratory (non-expectorated)     Status: None (Preliminary result)   Collection Time: 02/15/19 10:50 PM   Specimen: Tracheal Aspirate; Respiratory  Result Value Ref Range   Specimen Description TRACHEAL ASPIRATE    Special Requests NONE    Gram Stain NO WBC SEEN RARE GRAM POSITIVE COCCI IN PAIRS     Culture      CULTURE REINCUBATED FOR BETTER GROWTH Performed at [facility], [place] [address] [place], [place] [address]    Report Status PENDING   Magnesium     Status: Abnormal   Collection Time: 02/16/19  6:58 AM  Result Value Ref Range   Magnesium 3.2 (H) 1.7 - 2.4 mg/dL    Comment: Performed at [facility] [address], [place], [place] [address]  Phosphorus     Status: Abnormal   Collection Time: 02/16/19  6:58 AM  Result Value Ref Range   Phosphorus 5.2 (H) 2.5 - 4.6 mg/dL    Comment: Performed at [facility] [address], [place], [place] [address]  Hemoglobin A1c     Status: Abnormal   Collection Time: 02/16/19  6:58 AM  Result Value Ref Range  Hgb A1c MFr Bld 6.7 (H) 4.8 - 5.6 %    Comment: (NOTE) Pre diabetes:          5.7%-6.4% Diabetes:              >6.4% Glycemic control for   <7.0% adults with diabetes    Mean Plasma Glucose 145.59 mg/dL    Comment: Performed at [facility] [address], [place], [place] [address]  TSH     Status: None   Collection Time: 02/16/19  6:58 AM  Result Value Ref Range   TSH 2.629 0.350 - 4.500 uIU/mL    Comment: Performed by a 3rd Generation assay with a functional sensitivity of <=0.01 uIU/mL. Performed at [facility], [place] [address] [place], [place] [address]   Comprehensive metabolic panel     Status: Abnormal   Collection Time: 02/16/19  6:58 AM  Result Value Ref Range   Sodium 142 135 - 145 mmol/L   Potassium 4.9 3.5 - 5.1 mmol/L   Chloride 102 98 - 111 mmol/L   CO2 25 22 - 32 mmol/L   Glucose, Bld 182 (H) 70 - 99 mg/dL   BUN 152  (H) 8 - 23 mg/dL   Creatinine, Ser 4.54 (H) 0.61 - 1.24 mg/dL   Calcium 9.0 8.9 - 10.3 mg/dL   Total Protein 6.0 (L) 6.5 - 8.1 g/dL   Albumin 1.9 (L) 3.5 - 5.0 g/dL   AST 21 15 - 41 U/L   ALT 15 0 - 44 U/L   Alkaline Phosphatase 112 38 - 126 U/L   Total Bilirubin 1.1 0.3 - 1.2 mg/dL   GFR calc non Af Amer 12 (L) >60 mL/min   GFR calc Af Amer 14 (L) >60 mL/min   Anion gap 15 5 - 15    Comment: Performed at [facility], [place] [address], [place], [place] [address]  CBC with Differential/Platelet     Status: Abnormal   Collection Time: 02/16/19  6:58 AM  Result Value Ref Range   WBC 8.6 4.0 - 10.5 K/uL   RBC 3.13 (L) 4.22 - 5.81 MIL/uL   Hemoglobin 8.6 (L) 13.0 - 17.0 g/dL   HCT 27.0 (L) 39.0 - 52.0 %   MCV 86.3 80.0 - 100.0 fL   MCH 27.5 26.0 - 34.0 pg   MCHC 31.9 30.0 - 36.0 g/dL   RDW 15.6 (H) 11.5 - 15.5 %   Platelets 185 150 - 400 K/uL   nRBC 0.0 0.0 - 0.2 %   Neutrophils Relative % 85 %   Neutro Abs 7.4 1.7 - 7.7 K/uL   Lymphocytes Relative 8 %   Lymphs Abs 0.7 0.7 - 4.0 K/uL   Monocytes Relative 6 %   Monocytes Absolute 0.5 0.1 - 1.0 K/uL   Eosinophils Relative 0 %   Eosinophils Absolute 0.0 0.0 - 0.5 K/uL   Basophils Relative 0 %   Basophils Absolute 0.0 0.0 - 0.1 K/uL   Immature Granulocytes 1 %   Abs Immature Granulocytes 0.04 0.00 - 0.07 K/uL    Comment: Performed at [facility] [address], [place], [place] [address]  Protime-INR     Status: Abnormal   Collection Time: 02/16/19  6:58 AM  Result Value Ref Range   Prothrombin Time 15.7 (H) 11.4 - 15.2 seconds   INR 1.3 (H) 0.8 - 1.2    Comment: (NOTE) INR goal varies based on device and disease states. Performed at [facility], [place] [address], [place], [place] [address]   SARS Coronavirus 2 by RT PCR (hospital order, performed in [facility] hospital lab) Nasopharyngeal Nasopharyngeal Swab     Status: None   Collection Time: 02/16/19  2:20 PM   Specimen: Nasopharyngeal Swab  Result  Value Ref Range   SARS Coronavirus 2 NEGATIVE NEGATIVE    Comment: (NOTE) If result is NEGATIVE SARS-CoV-2 target nucleic acids are NOT DETECTED. The SARS-CoV-2 RNA is generally detectable in upper and lower  respiratory specimens during the acute phase of infection. The lowest  concentration of SARS-CoV-2 viral copies this assay can detect is 250  copies / mL. A negative result does not preclude SARS-CoV-2 infection  and should not be used as the sole basis for treatment or other  patient management decisions.  A negative result may occur with  improper specimen collection / handling, submission of specimen other  than nasopharyngeal swab, presence of viral mutation(s) within the  areas targeted by this assay, and inadequate number of viral copies  (<250 copies / mL). A negative result must be combined with clinical  observations, patient history, and epidemiological information. If result is POSITIVE SARS-CoV-2 target nucleic acids are DETECTED. The SARS-CoV-2 RNA is generally detectable in upper and lower  respiratory specimens dur ing the acute phase of infection.  Positive  results are indicative of active infection with SARS-CoV-2.  Clinical  correlation with patient history and other diagnostic information is  necessary to determine patient infection status.  Positive results do  not rule out bacterial infection or co-infection with other viruses. If result is PRESUMPTIVE POSTIVE SARS-CoV-2 nucleic acids MAY BE PRESENT.   A presumptive positive result was obtained on the submitted specimen  and confirmed on repeat testing.  While 2019 novel coronavirus  (SARS-CoV-2) nucleic acids may be present in the submitted sample  additional confirmatory testing may be necessary for epidemiological  and / or clinical management purposes  to differentiate between  SARS-CoV-2 and other Sarbecovirus currently known to infect humans.  If clinically indicated additional testing with an  alternate test  methodology [phone]) is advised. The SARS-CoV-2 RNA is generally  detectable in upper and lower respiratory sp ecimens during the acute  phase of infection. The expected result is Negative. Fact Sheet for Patients:  StrictlyIdeas.no Fact Sheet for Healthcare Providers: BankingDealers.co.za This test is not yet approved or cleared by the [place] FDA and has been authorized for detection and/or diagnosis of SARS-CoV-2 by FDA under an Emergency Use Authorization (EUA).  This EUA will remain in effect (meaning this test can be used) for the duration of the COVID-19 declaration under Section 564(b)(1) of the Act, 21 U.S.C. section 360bbb-3(b)(1), unless the authorization is terminated or revoked sooner. Performed at [facility], [place] [address] [place], [place] [address]   CBC     Status: Abnormal   Collection Time: 02/17/19  8:06 AM  Result Value Ref Range   WBC 9.5 4.0 - 10.5 K/uL   RBC 3.12 (L) 4.22 - 5.81 MIL/uL   Hemoglobin 8.6 (L) 13.0 - 17.0 g/dL   HCT 28.4 (L) 39.0 - 52.0 %   MCV 91.0 80.0 - 100.0 fL   MCH 27.6 26.0 - 34.0 pg   MCHC 30.3 30.0 - 36.0 g/dL   RDW 15.4 11.5 - 15.5 %   Platelets 224 150 - 400 K/uL   nRBC 0.0 0.0 - 0.2 %    Comment: Performed at [facility],  [address] [place], [place] [address]  Renal function panel     Status: Abnormal   Collection Time: 02/17/19  8:06 AM  Result Value Ref Range   Sodium 142 135 - 145 mmol/L   Potassium 5.1 3.5 - 5.1 mmol/L   Chloride 103 98 - 111 mmol/L   CO2 28 22 - 32 mmol/L   Glucose, Bld 200 (H) 70 - 99 mg/dL   BUN 160 (H) 8 - 23 mg/dL   Creatinine, Ser 4.95 (H) 0.61 - 1.24 mg/dL   Calcium 9.0 8.9 - 10.3 mg/dL   Phosphorus 6.6 (H) 2.5 - 4.6 mg/dL   Albumin 1.8 (L) 3.5 - 5.0 g/dL   GFR calc non Af Amer 11 (L) >60 mL/min   GFR calc Af Amer 13 (L) >60 mL/min   Anion gap 11 5 - 15    Comment: Performed at [facility] [address] [place], [place] [address]  Magnesium     Status: Abnormal   Collection Time: 02/17/19  8:06 AM  Result Value Ref Range   Magnesium 3.6 (H) 1.7 - 2.4 mg/dL    Comment: Performed at [facility] [address] [place], [place] [address]   Dg Abd 1 View  Result Date: 02/15/2019 CLINICAL DATA:  73 year old male with respiratory failure. EXAM: PORTABLE CHEST 1 VIEW COMPARISON:  None. FINDINGS: Tracheostomy with tip approximately 6 cm above the carina. There are small to moderate bilateral pleural effusions with associated bilateral lower lobe atelectasis versus infiltrate. Mild diffuse interstitial prominence may represent vascular congestion. There is no pneumothorax. There is mild cardiomegaly. Percutaneous gastrostomy with tip over the body of the stomach. Injected contrast is noted in the second portion of the duodenum as well as within the colon. The osseous structures and soft tissues are grossly unremarkable. There is silhouetting of the abdominal structures which may represent ascites. IMPRESSION: 1. Small to moderate bilateral pleural effusions with associated bilateral lower lobe atelectasis versus infiltrate. 2. Mild cardiomegaly with mild vascular congestion. 3. Percutaneous gastrostomy with tip over the body of the stomach. Electronically Signed   By: Anner Crete M.D.   On: 02/15/2019 19:59   US Renal  Result Date: 02/17/2019 CLINICAL DATA:  Acute kidney injury. EXAM: RENAL / URINARY TRACT ULTRASOUND COMPLETE COMPARISON:  None. FINDINGS: Right Kidney: Renal measurements: 9.6 x 4.4 x 3.8 cm = volume: 85 mL. Increased echogenicity of renal parenchyma is noted. No mass or hydronephrosis visualized. Left Kidney: Renal measurements: 10.0 x 5.0 x 4.9 cm = volume: 127 mL. Increased echogenicity of renal parenchyma is noted. No mass or hydronephrosis visualized. Bladder: Not visualized as bladder is decompressed secondary to catheter. Other: Mild ascites is noted. IMPRESSION:  Increased echogenicity of renal parenchyma is noted bilaterally suggesting medical renal disease. No other renal abnormality is noted. Mild ascites. Electronically Signed   By: Marijo Conception M.D.   On: 02/17/2019 08:50   Dg Chest Port 1 View  Result Date: 02/16/2019 CLINICAL DATA:  Pleural effusion. EXAM: PORTABLE CHEST 1 VIEW COMPARISON:  February 15, 2019. FINDINGS: Stable cardiomediastinal silhouette. Tracheostomy tube is unchanged in position. Stable bilateral lung opacities are noted concerning for edema or infiltrates with associated pleural effusions, right greater than left. Bony thorax is unremarkable. IMPRESSION: Stable bilateral lung opacities are noted concerning for edema or infiltrates with associated pleural effusions, right greater than left. Electronically Signed   By: Marijo Conception M.D.   On: 02/16/2019 12:53   Dg  Chest Port 1 View  Result Date: 02/15/2019 CLINICAL DATA:  73 year old male with respiratory failure. EXAM: PORTABLE CHEST 1 VIEW COMPARISON:  None. FINDINGS: Tracheostomy with tip approximately 6 cm above the carina. There are small to moderate bilateral pleural effusions with associated bilateral lower lobe atelectasis versus infiltrate. Mild diffuse interstitial prominence may represent vascular congestion. There is no pneumothorax. There is mild cardiomegaly. Percutaneous gastrostomy with tip over the body of the stomach. Injected contrast is noted in the second portion of the duodenum as well as within the colon. The osseous structures and soft tissues are grossly unremarkable. There is silhouetting of the abdominal structures which may represent ascites. IMPRESSION: 1. Small to moderate bilateral pleural effusions with associated bilateral lower lobe atelectasis versus infiltrate. 2. Mild cardiomegaly with mild vascular congestion. 3. Percutaneous gastrostomy with tip over the body of the stomach. Electronically Signed   By: Anner Crete M.D.   On: 02/15/2019 19:59     Review Of Systems As per PMH and floor chart.   There were no vitals taken for this visit. There is no height or weight on file to calculate BMI. General appearance: appears stated age and in moderate respiratory distress Head: Normocephalic, atraumatic. Eyes: Brown eyes, pale conjunctiva, corneas clear.  Neck: No adenopathy, no carotid bruit, no JVD, supple, symmetrical, trachea midline and thyroid not enlarged. Trach collar with 30 % FiO2 from ventilator. Resp: Rhonchi to auscultation bilaterally. Cardio: Regular rate and rhythm, S1, S2 normal, II/VI systolic murmur, no click, rub or gallop GI: Soft, non-tender; bowel sounds normal; no organomegaly. Extremities: trace edema, cyanosis or clubbing. Discoloration and Cool[place] feet bilaterally. Skin: Warm and dry.  Neurologic: Alert and oriented X 0.  Assessment/Plan Sinus bradycardia Acute on chronic respiratory failure COPD PVD Hypertension Large bilateral pleural effusion Grade II diastolic dysfunction CKD, V Type 2 DM S/P stroke  Decrease amlodipine to 5 mg. daily. Add hydralazine 10 mg. twice daily. May hold carvedilol as needed.  Time spent: Review of old records, Lab, x-rays, EKG, other cardiac tests, examination, discussion with patient's doctor and PA over 70 minutes.  Birdie Riddle, MD  02/17/2019, 3:59 PM

## 2019-02-18 DIAGNOSIS — I5043 Acute on chronic combined systolic (congestive) and diastolic (congestive) heart failure: Secondary | ICD-10-CM | POA: Diagnosis not present

## 2019-02-18 DIAGNOSIS — N179 Acute kidney failure, unspecified: Secondary | ICD-10-CM | POA: Diagnosis not present

## 2019-02-18 DIAGNOSIS — J9621 Acute and chronic respiratory failure with hypoxia: Secondary | ICD-10-CM | POA: Diagnosis not present

## 2019-02-18 DIAGNOSIS — J449 Chronic obstructive pulmonary disease, unspecified: Secondary | ICD-10-CM | POA: Diagnosis not present

## 2019-02-18 LAB — RENAL FUNCTION PANEL
Albumin: 1.8 g/dL — ABNORMAL LOW (ref 3.5–5.0)
Anion gap: 14 (ref 5–15)
BUN: 170 mg/dL — ABNORMAL HIGH (ref 8–23)
CO2: 24 mmol/L (ref 22–32)
Calcium: 9.3 mg/dL (ref 8.9–10.3)
Chloride: 102 mmol/L (ref 98–111)
Creatinine, Ser: 5.33 mg/dL — ABNORMAL HIGH (ref 0.61–1.24)
GFR calc Af Amer: 11 mL/min — ABNORMAL LOW (ref >60)
GFR calc non Af Amer: 10 mL/min — ABNORMAL LOW (ref >60)
Glucose, Bld: 238 mg/dL — ABNORMAL HIGH (ref 70–99)
Phosphorus: 6.3 mg/dL — ABNORMAL HIGH (ref 2.5–4.6)
Potassium: 5 mmol/L (ref 3.5–5.1)
Sodium: 140 mmol/L (ref 135–145)

## 2019-02-18 LAB — CBC
HCT: 27.3 % — ABNORMAL LOW (ref 39.0–52.0)
Hemoglobin: 8.8 g/dL — ABNORMAL LOW (ref 13.0–17.0)
MCH: 28.2 pg (ref 26.0–34.0)
MCHC: 32.2 g/dL (ref 30.0–36.0)
MCV: 87.5 fL (ref 80.0–100.0)
Platelets: 193 10*3/uL (ref 150–400)
RBC: 3.12 MIL/uL — ABNORMAL LOW (ref 4.22–5.81)
RDW: 15.2 % (ref 11.5–15.5)
WBC: 7.3 10*3/uL (ref 4.0–10.5)
nRBC: 0 % (ref 0.0–0.2)

## 2019-02-18 LAB — MAGNESIUM: Magnesium: 3.6 mg/dL — ABNORMAL HIGH (ref 1.7–2.4)

## 2019-02-18 NOTE — Progress Notes (Addendum)
Pulmonary Critical Care Medicine [place]   PULMONARY CRITICAL CARE SERVICE  PROGRESS NOTE  Date of Service: 02/18/2019  Paul Ball  EUM:[id number]  DOB: 1945/11/02   DOA: 02/15/2019  Referring Physician: [name], MD  HPI: Paul Ball is a 73 y.o. male seen for follow up of Acute on Chronic Respiratory Failure.  Patient was pleasantly periorbital pressure for today however I met 40 minutes correctly placed intervals.  Medications: Reviewed on Rounds  Physical Exam:  Vitals: Pulse 83 respirations 28 BP 170/65 O2 sat 97% 3  Ventilator Settings ventilator AC VC rate of 16 tidal 05/08/2018 PEEP of 5 and FiO2 of 28%  . General: Comfortable at this time . Eyes: Grossly normal lids, irises & conjunctiva . ENT: grossly tongue is normal . Neck: no obvious mass . Cardiovascular: S1 S2 normal no gallop . Respiratory: No rales or rhonchi noted . Abdomen: soft . Skin: no rash seen on limited exam . Musculoskeletal: not rigid . Psychiatric:unable to assess . Neurologic: no seizure no involuntary movements         Lab Data:   Basic Metabolic Panel: Recent Labs  Lab 02/16/19 0658 02/17/19 0806 02/18/19 0524  NA 142 142 140  K 4.9 5.1 5.0  CL 102 103 102  CO2 25 28 24   GLUCOSE 182* 200* 238*  BUN 152* 160* 170*  CREATININE 4.54* 4.95* 5.33*  CALCIUM 9.0 9.0 9.3  MG 3.2* 3.6* 3.6*  PHOS 5.2* 6.6* 6.3*    ABG: Recent Labs  Lab 02/15/19 1930  PHART 7.396  PCO2ART 46.3  PO2ART 51.8*  HCO3 27.8  O2SAT 81.7    Liver Function Tests: Recent Labs  Lab 02/16/19 0658 02/17/19 0806 02/18/19 0524  AST 21  --   --   ALT 15  --   --   ALKPHOS 112  --   --   BILITOT 1.1  --   --   PROT 6.0*  --   --   ALBUMIN 1.9* 1.8* 1.8*   No results for input(s): LIPASE, AMYLASE in the last 168 hours. No results for input(s): AMMONIA in the last 168 hours.  CBC: Recent Labs  Lab 02/16/19 0658 02/17/19 0806 02/18/19 0524  WBC 8.6 9.5 7.3   NEUTROABS 7.4  --   --   HGB 8.6* 8.6* 8.8*  HCT 27.0* 28.4* 27.3*  MCV 86.3 91.0 87.5  PLT 185 224 193    Cardiac Enzymes: No results for input(s): CKTOTAL, CKMB, CKMBINDEX, TROPONINI in the last 168 hours.  BNP (last 3 results) No results for input(s): BNP in the last 8760 hours.  ProBNP (last 3 results) No results for input(s): PROBNP in the last 8760 hours.  Radiological Exams: US Renal  Result Date: 02/17/2019 CLINICAL DATA:  Acute kidney injury. EXAM: RENAL / URINARY TRACT ULTRASOUND COMPLETE COMPARISON:  None. FINDINGS: Right Kidney: Renal measurements: 9.6 x 4.4 x 3.8 cm = volume: 85 mL. Increased echogenicity of renal parenchyma is noted. No mass or hydronephrosis visualized. Left Kidney: Renal measurements: 10.0 x 5.0 x 4.9 cm = volume: 127 mL. Increased echogenicity of renal parenchyma is noted. No mass or hydronephrosis visualized. Bladder: Not visualized as bladder is decompressed secondary to catheter. Other: Mild ascites is noted. IMPRESSION: Increased echogenicity of renal parenchyma is noted bilaterally suggesting medical renal disease. No other renal abnormality is noted. Mild ascites. Electronically Signed   By: Marijo Conception M.D.   On: 02/17/2019 08:50    Assessment/Plan Active Problems:  Acute on chronic respiratory failure with hypoxia (HCC)   Acute on chronic systolic and diastolic heart failure, NYHA class 3 (HCC)   Acute renal failure superimposed on stage 3 chronic kidney disease ([facility])   Healthcare-associated pneumonia   COPD, severe ([place])   1. Acute on chronic respiratory failure hypoxia patient tells me today we will continue to tingling vertigo.  Continue supportive measures. 2. Acute on chronic systolic heart failure continue with supportive care diuretics as necessary 3. Acute on chronic kidney disease stage III we will continue with monitoring labs as well as monitoring fluid status 4. Healthcare associated pneumonia treated extensively as  previously noted on the HPI 5. Severe COPD nebulizers as necessary    I have personally seen and evaluated the patient, evaluated laboratory and imaging results, formulated the assessment and plan and placed orders. The Patient requires high complexity decision making for assessment and support.  Case was discussed on Rounds with the Respiratory Therapy Staff  Allyne Gee, MD [facility] Pulmonary Critical Care Medicine Sleep Medicine

## 2019-02-19 ENCOUNTER — Encounter (HOSPITAL_COMMUNITY): Payer: Self-pay | Admitting: Diagnostic Radiology

## 2019-02-19 ENCOUNTER — Other Ambulatory Visit (HOSPITAL_COMMUNITY): Payer: Medicare Other

## 2019-02-19 DIAGNOSIS — N179 Acute kidney failure, unspecified: Secondary | ICD-10-CM | POA: Diagnosis not present

## 2019-02-19 DIAGNOSIS — J9621 Acute and chronic respiratory failure with hypoxia: Secondary | ICD-10-CM | POA: Diagnosis not present

## 2019-02-19 DIAGNOSIS — I5043 Acute on chronic combined systolic (congestive) and diastolic (congestive) heart failure: Secondary | ICD-10-CM | POA: Diagnosis not present

## 2019-02-19 DIAGNOSIS — J449 Chronic obstructive pulmonary disease, unspecified: Secondary | ICD-10-CM | POA: Diagnosis not present

## 2019-02-19 HISTORY — PX: IR FLUORO GUIDE CV LINE RIGHT: IMG2283

## 2019-02-19 HISTORY — PX: IR US GUIDE VASC ACCESS RIGHT: IMG2390

## 2019-02-19 LAB — CBC
HCT: 27.5 % — ABNORMAL LOW (ref 39.0–52.0)
Hemoglobin: 9 g/dL — ABNORMAL LOW (ref 13.0–17.0)
MCH: 28.1 pg (ref 26.0–34.0)
MCHC: 32.7 g/dL (ref 30.0–36.0)
MCV: 85.9 fL (ref 80.0–100.0)
Platelets: 179 10*3/uL (ref 150–400)
RBC: 3.2 MIL/uL — ABNORMAL LOW (ref 4.22–5.81)
RDW: 15.2 % (ref 11.5–15.5)
WBC: 7.1 10*3/uL (ref 4.0–10.5)
nRBC: 0 % (ref 0.0–0.2)

## 2019-02-19 LAB — MAGNESIUM: Magnesium: 3.6 mg/dL — ABNORMAL HIGH (ref 1.7–2.4)

## 2019-02-19 LAB — RENAL FUNCTION PANEL
Albumin: 1.8 g/dL — ABNORMAL LOW (ref 3.5–5.0)
Anion gap: 13 (ref 5–15)
BUN: 176 mg/dL — ABNORMAL HIGH (ref 8–23)
CO2: 26 mmol/L (ref 22–32)
Calcium: 9.3 mg/dL (ref 8.9–10.3)
Chloride: 103 mmol/L (ref 98–111)
Creatinine, Ser: 5.61 mg/dL — ABNORMAL HIGH (ref 0.61–1.24)
GFR calc Af Amer: 11 mL/min — ABNORMAL LOW (ref >60)
GFR calc non Af Amer: 9 mL/min — ABNORMAL LOW (ref >60)
Glucose, Bld: 165 mg/dL — ABNORMAL HIGH (ref 70–99)
Phosphorus: 6.4 mg/dL — ABNORMAL HIGH (ref 2.5–4.6)
Potassium: 4.8 mmol/L (ref 3.5–5.1)
Sodium: 142 mmol/L (ref 135–145)

## 2019-02-19 LAB — CULTURE, RESPIRATORY W GRAM STAIN: Gram Stain: NONE SEEN

## 2019-02-19 LAB — HEPATITIS B CORE ANTIBODY, TOTAL: Hep B Core Total Ab: NONREACTIVE

## 2019-02-19 LAB — HEPATITIS B SURFACE ANTIGEN: Hepatitis B Surface Ag: NONREACTIVE

## 2019-02-19 MED ORDER — HEPARIN SODIUM (PORCINE) 1000 UNIT/ML IJ SOLN
INTRAMUSCULAR | Status: AC | PRN
  Administered 2019-02-19: 2.8 mL via INTRAVENOUS

## 2019-02-19 MED ORDER — HEPARIN SODIUM (PORCINE) 1000 UNIT/ML IJ SOLN
INTRAMUSCULAR | Status: AC
  Filled 2019-02-19: qty 1

## 2019-02-19 MED ORDER — LIDOCAINE HCL 1 % IJ SOLN
INTRAMUSCULAR | Status: AC | PRN
  Administered 2019-02-19: 5 mL

## 2019-02-19 MED ORDER — GENERIC EXTERNAL MEDICATION
Status: DC
Start: ? — End: 2019-02-19

## 2019-02-19 MED ORDER — LIDOCAINE HCL 1 % IJ SOLN
INTRAMUSCULAR | Status: AC
  Filled 2019-02-19: qty 20

## 2019-02-19 NOTE — Progress Notes (Addendum)
Pulmonary Critical Care Medicine [place]   PULMONARY CRITICAL CARE SERVICE  PROGRESS NOTE  Date of Service: 02/19/2019  Paul Ball  [name]  DOB: 1946-03-04   DOA: 02/15/2019  Referring Physician: [name], MD  HPI: Paul Ball is a 73 y.o. male seen for follow up of Acute on Chronic Respiratory Failure.  Patient remains on full support ventilator, failed SBT today.    Medications: Reviewed on Rounds  Physical Exam:  Vitals: Pulse 73 respiratory 26 BP 150/64 O2 sat 98%, temp 97.2  Ventilator Settings rate of 16 tidal 05/08/2018 PEEP of 5 FiO2 28%  . General: Comfortable at this time . Eyes: Grossly normal lids, irises & conjunctiva . ENT: grossly tongue is normal . Neck: no obvious mass . Cardiovascular: S1 S2 normal no gallop . Respiratory: No rales or rhonchi noted . Abdomen: soft . Skin: no rash seen on limited exam . Musculoskeletal: not rigid . Psychiatric:unable to assess . Neurologic: no seizure no involuntary movements         Lab Data:   Basic Metabolic Panel: Recent Labs  Lab 02/16/19 0658 02/17/19 0806 02/18/19 0524 02/19/19 0724  NA 142 142 140 142  K 4.9 5.1 5.0 4.8  CL 102 103 102 103  CO2 25 28 24 26   GLUCOSE 182* 200* 238* 165*  BUN 152* 160* 170* 176*  CREATININE 4.54* 4.95* 5.33* 5.61*  CALCIUM 9.0 9.0 9.3 9.3  MG 3.2* 3.6* 3.6* 3.6*  PHOS 5.2* 6.6* 6.3* 6.4*    ABG: Recent Labs  Lab 02/15/19 1930  PHART 7.396  PCO2ART 46.3  PO2ART 51.8*  HCO3 27.8  O2SAT 81.7    Liver Function Tests: Recent Labs  Lab 02/16/19 0658 02/17/19 0806 02/18/19 0524 02/19/19 0724  AST 21  --   --   --   ALT 15  --   --   --   ALKPHOS 112  --   --   --   BILITOT 1.1  --   --   --   PROT 6.0*  --   --   --   ALBUMIN 1.9* 1.8* 1.8* 1.8*   No results for input(s): LIPASE, AMYLASE in the last 168 hours. No results for input(s): AMMONIA in the last 168 hours.  CBC: Recent Labs  Lab 02/16/19 0658  02/17/19 0806 02/18/19 0524 02/19/19 0724  WBC 8.6 9.5 7.3 7.1  NEUTROABS 7.4  --   --   --   HGB 8.6* 8.6* 8.8* 9.0*  HCT 27.0* 28.4* 27.3* 27.5*  MCV 86.3 91.0 87.5 85.9  PLT 185 224 193 179    Cardiac Enzymes: No results for input(s): CKTOTAL, CKMB, CKMBINDEX, TROPONINI in the last 168 hours.  BNP (last 3 results) No results for input(s): BNP in the last 8760 hours.  ProBNP (last 3 results) No results for input(s): PROBNP in the last 8760 hours.  Radiological Exams: Ir Cyndy Freeze Guide Cv Line Right  Result Date: 02/19/2019 INDICATION: 73 year old with renal failure and request for a hemodialysis catheter. EXAM: FLUOROSCOPIC AND ULTRASOUND GUIDED PLACEMENT OF A NON-TUNNELED DIALYSIS CATHETER Physician: Stephan Minister. Henn, MD MEDICATIONS: None ANESTHESIA/SEDATION: None FLUOROSCOPY TIME:  Fluoroscopy Time: 30 seconds, 1.7 mGy COMPLICATIONS: None immediate. PROCEDURE: Informed consent was obtained for catheter placement. The patient was placed supine on the interventional table. Ultrasound confirmed a patent right internal jugular vein. Ultrasound images were obtained for documentation. The right side of the neck was prepped and draped in a sterile fashion. The right neck was  anesthetized with 1% lidocaine. Maximal barrier sterile technique was utilized including caps, mask, sterile gowns, sterile gloves, sterile drape, hand hygiene and skin antiseptic. A small incision was made with #11 blade scalpel. A 21 gauge needle directed into the right internal jugular vein with ultrasound guidance. A micropuncture dilator set was placed. A 20 cm Mahurkar catheter was selected. The catheter was advanced over a wire and positioned in the lower SVC. Fluoroscopic images were obtained for documentation. Both dialysis lumens were found to aspirate and flush well. The proper amount of heparin was flushed in both lumens. The central venous lumen was flushed with normal saline. Catheter was sutured to skin. FINDINGS:  Catheter tip in the lower SVC. IMPRESSION: Successful placement of a right jugular non-tunneled dialysis catheter using ultrasound and fluoroscopic guidance. Electronically Signed   By: Markus Daft M.D.   On: 02/19/2019 15:44   Ir US Guide Vasc Access Right  Result Date: 02/19/2019 INDICATION: 73 year old with renal failure and request for a hemodialysis catheter. EXAM: FLUOROSCOPIC AND ULTRASOUND GUIDED PLACEMENT OF A NON-TUNNELED DIALYSIS CATHETER Physician: Stephan Minister. Henn, MD MEDICATIONS: None ANESTHESIA/SEDATION: None FLUOROSCOPY TIME:  Fluoroscopy Time: 30 seconds, 1.7 mGy COMPLICATIONS: None immediate. PROCEDURE: Informed consent was obtained for catheter placement. The patient was placed supine on the interventional table. Ultrasound confirmed a patent right internal jugular vein. Ultrasound images were obtained for documentation. The right side of the neck was prepped and draped in a sterile fashion. The right neck was anesthetized with 1% lidocaine. Maximal barrier sterile technique was utilized including caps, mask, sterile gowns, sterile gloves, sterile drape, hand hygiene and skin antiseptic. A small incision was made with #11 blade scalpel. A 21 gauge needle directed into the right internal jugular vein with ultrasound guidance. A micropuncture dilator set was placed. A 20 cm Mahurkar catheter was selected. The catheter was advanced over a wire and positioned in the lower SVC. Fluoroscopic images were obtained for documentation. Both dialysis lumens were found to aspirate and flush well. The proper amount of heparin was flushed in both lumens. The central venous lumen was flushed with normal saline. Catheter was sutured to skin. FINDINGS: Catheter tip in the lower SVC. IMPRESSION: Successful placement of a right jugular non-tunneled dialysis catheter using ultrasound and fluoroscopic guidance. Electronically Signed   By: Markus Daft M.D.   On: 02/19/2019 15:44    Assessment/Plan Active Problems:    Acute on chronic respiratory failure with hypoxia (HCC)   Acute on chronic systolic and diastolic heart failure, NYHA class 3 (HCC)   Acute renal failure superimposed on stage 3 chronic kidney disease (HCC)   Healthcare-associated pneumonia   COPD, severe ([place])   1. Acute on chronic respiratory failure hypoxia  patient continues continues with full support.  Continue supportive measures and pulmonary toilet 2. Acute on chronic systolic heart failure continue with supportive care diuretics as necessary 3. Acute on chronic kidney disease stage III we will continue with monitoring labs as well as monitoring fluid status 4. Healthcare associated pneumonia treated extensively as previously noted on the HPI 5. Severe COPD nebulizers as necessary   I have personally seen and evaluated the patient, evaluated laboratory and imaging results, formulated the assessment and plan and placed orders. The Patient requires high complexity decision making for assessment and support.  Case was discussed on Rounds with the Respiratory Therapy Staff  Allyne Gee, MD [facility] Pulmonary Critical Care Medicine Sleep Medicine

## 2019-02-19 NOTE — H&P (Signed)
   Patient Status: Paul Ball pt  Assessment and Plan:  Patient in need of venous access for initiation of dialysis - per primary team it is felt that patient will not require HD long term; will place temporary HD catheter today and primary team will contact IR if a conversion to a tunneled catheter is required. I spoke with patient's daughter/POA, Melody, today via phone - all questions addressed and she gives consent for procedure to be performed today.  Risks and benefits discussed with the patient's daughter, Melody, including, but not limited to bleeding, infection, vascular injury, pneumothorax which may require chest tube placement, air embolism or even death.  All of the patient's daughter's questions were answered, patient's daughter is agreeable to proceed.  Consent signed and in IR control room. ______________________________________________________________________   History of Present Illness: Paul Ball is a 72 y.o. male with past medical history significant for PVD, COPD, chronic respiratory failure, CVD, DM and CKD V who currently resides at [facility]. Patient has been noted to have worsening renal function (4.95 on admission to [facility], currently 5.61 today) and requires temporary dialysis. IR has been asked to place a temporary HD catheter to initiate dialysis today per RN.  Allergies and medications reviewed.   Review of Systems: A 12 point ROS discussed and pertinent positives are indicated in the HPI above.  All other systems are negative.  Review of Systems  Unable to perform ROS: Patient nonverbal    Vital Signs: There were no vitals taken for this visit.  Physical Exam Vitals signs reviewed.  Constitutional:      General: He is not in acute distress.    Appearance: He is ill-appearing.  Cardiovascular:     Rate and Rhythm: Normal rate.  Pulmonary:     Comments: Venitlated Abdominal:     Palpations: Abdomen is soft.  Skin:    General: Skin is  warm and dry.  Neurological:     Mental Status: He is alert. Mental status is at baseline.      Imaging reviewed.   Labs:  COAGS: Recent Labs    02/16/19 0658  INR 1.3*    BMP: Recent Labs    02/16/19 0658 02/17/19 0806 02/18/19 0524 02/19/19 0724  NA 142 142 140 142  K 4.9 5.1 5.0 4.8  CL 102 103 102 103  CO2 25 28 24 26   GLUCOSE 182* 200* 238* 165*  BUN 152* 160* 170* 176*  CALCIUM 9.0 9.0 9.3 9.3  CREATININE 4.54* 4.95* 5.33* 5.61*  GFRNONAA 12* 11* 10* 9*  GFRAA 14* 13* 11* 11*       Electronically Signed: Joaquim Nam, PA-C 02/19/2019, 2:30 PM   I spent a total of 15 minutes in face to face in clinical consultation, greater than 50% of which was counseling/coordinating care for venous access.

## 2019-02-19 NOTE — Procedures (Signed)
Interventional Radiology Procedure:   Indications: Needs hemodialysis  Procedure: Placement of non-tunneled HD catheter  Findings: Right IJ catheter, tip in lower SVC region  Complications: None     EBL: less than 10 ml  Plan: Catheter is ready to use.     Kimila Papaleo R. Anselm Pancoast, MD  Pager: [phone]

## 2019-02-20 DIAGNOSIS — J9621 Acute and chronic respiratory failure with hypoxia: Secondary | ICD-10-CM | POA: Diagnosis not present

## 2019-02-20 DIAGNOSIS — N179 Acute kidney failure, unspecified: Secondary | ICD-10-CM | POA: Diagnosis not present

## 2019-02-20 DIAGNOSIS — J449 Chronic obstructive pulmonary disease, unspecified: Secondary | ICD-10-CM | POA: Diagnosis not present

## 2019-02-20 DIAGNOSIS — I5043 Acute on chronic combined systolic (congestive) and diastolic (congestive) heart failure: Secondary | ICD-10-CM | POA: Diagnosis not present

## 2019-02-20 LAB — MAGNESIUM: Magnesium: 3.5 mg/dL — ABNORMAL HIGH (ref 1.7–2.4)

## 2019-02-20 LAB — RENAL FUNCTION PANEL
Albumin: 1.7 g/dL — ABNORMAL LOW (ref 3.5–5.0)
Anion gap: 13 (ref 5–15)
BUN: 175 mg/dL — ABNORMAL HIGH (ref 8–23)
CO2: 26 mmol/L (ref 22–32)
Calcium: 9.3 mg/dL (ref 8.9–10.3)
Chloride: 105 mmol/L (ref 98–111)
Creatinine, Ser: 5.92 mg/dL — ABNORMAL HIGH (ref 0.61–1.24)
GFR calc Af Amer: 10 mL/min — ABNORMAL LOW (ref >60)
GFR calc non Af Amer: 9 mL/min — ABNORMAL LOW (ref >60)
Glucose, Bld: 128 mg/dL — ABNORMAL HIGH (ref 70–99)
Phosphorus: 6.6 mg/dL — ABNORMAL HIGH (ref 2.5–4.6)
Potassium: 4.8 mmol/L (ref 3.5–5.1)
Sodium: 144 mmol/L (ref 135–145)

## 2019-02-20 LAB — HEPATITIS B SURFACE ANTIBODY, QUANTITATIVE: Hep B S AB Quant (Post): 23.8 m[IU]/mL (ref >9.9)

## 2019-02-20 LAB — CBC
HCT: 26.4 % — ABNORMAL LOW (ref 39.0–52.0)
Hemoglobin: 8.6 g/dL — ABNORMAL LOW (ref 13.0–17.0)
MCH: 28.2 pg (ref 26.0–34.0)
MCHC: 32.6 g/dL (ref 30.0–36.0)
MCV: 86.6 fL (ref 80.0–100.0)
Platelets: 171 10*3/uL (ref 150–400)
RBC: 3.05 MIL/uL — ABNORMAL LOW (ref 4.22–5.81)
RDW: 15.5 % (ref 11.5–15.5)
WBC: 7.9 10*3/uL (ref 4.0–10.5)
nRBC: 0 % (ref 0.0–0.2)

## 2019-02-20 LAB — PTH, INTACT AND CALCIUM
Calcium, Total (PTH): 9 mg/dL (ref 8.6–10.2)
PTH: 25 pg/mL (ref 15–65)

## 2019-02-20 NOTE — Progress Notes (Addendum)
Pulmonary Critical Care Medicine [place]   PULMONARY CRITICAL CARE SERVICE  PROGRESS NOTE  Date of Service: 02/20/2019  Mahin [place]  [id number]  DOB: [birth year]   DOA: 02/15/2019  Referring Physician: [name], MD  HPI: Paul Ball is a 73 y.o. male seen for follow up of Acute on Chronic Respiratory Failure.  Patient has a goal today on pressure support 12 FiO2 40%.  Currently satting well no distress.  Medications: Reviewed on Rounds  Physical Exam:  Vitals: Pulse 68 respirations 25 BP 123/48 O2 sat 96% temp 97.1  Ventilator Settings per support 12 FiO2 40%  . General: Comfortable at this time . Eyes: Grossly normal lids, irises & conjunctiva . ENT: grossly tongue is normal . Neck: no obvious mass . Cardiovascular: S1 S2 normal no gallop . Respiratory: No rales or rhonchi noted . Abdomen: soft . Skin: no rash seen on limited exam . Musculoskeletal: not rigid . Psychiatric:unable to assess . Neurologic: no seizure no involuntary movements         Lab Data:   Basic Metabolic Panel: Recent Labs  Lab 02/16/19 0658 02/17/19 0806 02/18/19 0524 02/19/19 0724 02/19/19 1455 02/20/19 0706  NA 142 142 140 142  --  144  K 4.9 5.1 5.0 4.8  --  4.8  CL 102 103 102 103  --  105  CO2 25 28 24 26   --  26  GLUCOSE 182* 200* 238* 165*  --  128*  BUN 152* 160* 170* 176*  --  175*  CREATININE 4.54* 4.95* 5.33* 5.61*  --  5.92*  CALCIUM 9.0 9.0 9.3 9.3 9.0 9.3  MG 3.2* 3.6* 3.6* 3.6*  --  3.5*  PHOS 5.2* 6.6* 6.3* 6.4*  --  6.6*    ABG: Recent Labs  Lab 02/15/19 1930  PHART 7.396  PCO2ART 46.3  PO2ART 51.8*  HCO3 27.8  O2SAT 81.7    Liver Function Tests: Recent Labs  Lab 02/16/19 0658 02/17/19 0806 02/18/19 0524 02/19/19 0724 02/20/19 0706  AST 21  --   --   --   --   ALT 15  --   --   --   --   ALKPHOS 112  --   --   --   --   BILITOT 1.1  --   --   --   --   PROT 6.0*  --   --   --   --   ALBUMIN 1.9* 1.8* 1.8* 1.8* 1.7*    No results for input(s): LIPASE, AMYLASE in the last 168 hours. No results for input(s): AMMONIA in the last 168 hours.  CBC: Recent Labs  Lab 02/16/19 0658 02/17/19 0806 02/18/19 0524 02/19/19 0724 02/20/19 0706  WBC 8.6 9.5 7.3 7.1 7.9  NEUTROABS 7.4  --   --   --   --   HGB 8.6* 8.6* 8.8* 9.0* 8.6*  HCT 27.0* 28.4* 27.3* 27.5* 26.4*  MCV 86.3 91.0 87.5 85.9 86.6  PLT 185 224 193 179 171    Cardiac Enzymes: No results for input(s): CKTOTAL, CKMB, CKMBINDEX, TROPONINI in the last 168 hours.  BNP (last 3 results) No results for input(s): BNP in the last 8760 hours.  ProBNP (last 3 results) No results for input(s): PROBNP in the last 8760 hours.  Radiological Exams: Ir Cyndy Freeze Guide Cv Line Right  Result Date: 02/19/2019 INDICATION: 73 year old with renal failure and request for a hemodialysis catheter. EXAM: FLUOROSCOPIC AND ULTRASOUND GUIDED PLACEMENT OF A NON-TUNNELED DIALYSIS  CATHETER Physician: Stephan Minister. Henn, MD MEDICATIONS: None ANESTHESIA/SEDATION: None FLUOROSCOPY TIME:  Fluoroscopy Time: 30 seconds, 1.7 mGy COMPLICATIONS: None immediate. PROCEDURE: Informed consent was obtained for catheter placement. The patient was placed supine on the interventional table. Ultrasound confirmed a patent right internal jugular vein. Ultrasound images were obtained for documentation. The right side of the neck was prepped and draped in a sterile fashion. The right neck was anesthetized with 1% lidocaine. Maximal barrier sterile technique was utilized including caps, mask, sterile gowns, sterile gloves, sterile drape, hand hygiene and skin antiseptic. A small incision was made with #11 blade scalpel. A 21 gauge needle directed into the right internal jugular vein with ultrasound guidance. A micropuncture dilator set was placed. A 20 cm Mahurkar catheter was selected. The catheter was advanced over a wire and positioned in the lower SVC. Fluoroscopic images were obtained for documentation.  Both dialysis lumens were found to aspirate and flush well. The proper amount of heparin was flushed in both lumens. The central venous lumen was flushed with normal saline. Catheter was sutured to skin. FINDINGS: Catheter tip in the lower SVC. IMPRESSION: Successful placement of a right jugular non-tunneled dialysis catheter using ultrasound and fluoroscopic guidance. Electronically Signed   By: Markus Daft M.D.   On: 02/19/2019 15:44   Ir US Guide Vasc Access Right  Result Date: 02/19/2019 INDICATION: 73 year old with renal failure and request for a hemodialysis catheter. EXAM: FLUOROSCOPIC AND ULTRASOUND GUIDED PLACEMENT OF A NON-TUNNELED DIALYSIS CATHETER Physician: Stephan Minister. Henn, MD MEDICATIONS: None ANESTHESIA/SEDATION: None FLUOROSCOPY TIME:  Fluoroscopy Time: 30 seconds, 1.7 mGy COMPLICATIONS: None immediate. PROCEDURE: Informed consent was obtained for catheter placement. The patient was placed supine on the interventional table. Ultrasound confirmed a patent right internal jugular vein. Ultrasound images were obtained for documentation. The right side of the neck was prepped and draped in a sterile fashion. The right neck was anesthetized with 1% lidocaine. Maximal barrier sterile technique was utilized including caps, mask, sterile gowns, sterile gloves, sterile drape, hand hygiene and skin antiseptic. A small incision was made with #11 blade scalpel. A 21 gauge needle directed into the right internal jugular vein with ultrasound guidance. A micropuncture dilator set was placed. A 20 cm Mahurkar catheter was selected. The catheter was advanced over a wire and positioned in the lower SVC. Fluoroscopic images were obtained for documentation. Both dialysis lumens were found to aspirate and flush well. The proper amount of heparin was flushed in both lumens. The central venous lumen was flushed with normal saline. Catheter was sutured to skin. FINDINGS: Catheter tip in the lower SVC. IMPRESSION: Successful  placement of a right jugular non-tunneled dialysis catheter using ultrasound and fluoroscopic guidance. Electronically Signed   By: Markus Daft M.D.   On: 02/19/2019 15:44    Assessment/Plan Active Problems:   Acute on chronic respiratory failure with hypoxia (HCC)   Acute on chronic systolic and diastolic heart failure, NYHA class 3 (HCC)   Acute renal failure superimposed on stage 3 chronic kidney disease (HCC)   Healthcare-associated pneumonia   COPD, severe ([place])   1. Acute on chronic respiratory failure hypoxia  patient will continue to wean pressure support as time.  Continue aggressive pulmonary toilet and supportive measures. 2. Acute on chronic systolic heart failure continue with supportive care diuretics as necessary 3. Acute on chronic kidney disease stage III we will continue with monitoring labs as well as monitoring fluid status 4. Healthcare associated pneumonia treated extensively as previously noted on the HPI  5. Severe COPD nebulizers as necessary    I have personally seen and evaluated the patient, evaluated laboratory and imaging results, formulated the assessment and plan and placed orders. The Patient requires high complexity decision making for assessment and support.  Case was discussed on Rounds with the Respiratory Therapy Staff  Allyne Gee, MD [facility] Pulmonary Critical Care Medicine Sleep Medicine

## 2019-02-21 ENCOUNTER — Other Ambulatory Visit (HOSPITAL_COMMUNITY): Payer: Medicare Other

## 2019-02-21 DIAGNOSIS — J9621 Acute and chronic respiratory failure with hypoxia: Secondary | ICD-10-CM | POA: Diagnosis not present

## 2019-02-21 DIAGNOSIS — I5043 Acute on chronic combined systolic (congestive) and diastolic (congestive) heart failure: Secondary | ICD-10-CM | POA: Diagnosis not present

## 2019-02-21 DIAGNOSIS — N179 Acute kidney failure, unspecified: Secondary | ICD-10-CM | POA: Diagnosis not present

## 2019-02-21 DIAGNOSIS — J449 Chronic obstructive pulmonary disease, unspecified: Secondary | ICD-10-CM | POA: Diagnosis not present

## 2019-02-21 LAB — CBC
HCT: 24.3 % — ABNORMAL LOW (ref 39.0–52.0)
Hemoglobin: 7.5 g/dL — ABNORMAL LOW (ref 13.0–17.0)
MCH: 27.8 pg (ref 26.0–34.0)
MCHC: 30.9 g/dL (ref 30.0–36.0)
MCV: 90 fL (ref 80.0–100.0)
Platelets: 181 10*3/uL (ref 150–400)
RBC: 2.7 MIL/uL — ABNORMAL LOW (ref 4.22–5.81)
RDW: 15.5 % (ref 11.5–15.5)
WBC: 7 10*3/uL (ref 4.0–10.5)
nRBC: 0 % (ref 0.0–0.2)

## 2019-02-21 LAB — RENAL FUNCTION PANEL
Albumin: 1.7 g/dL — ABNORMAL LOW (ref 3.5–5.0)
Anion gap: 15 (ref 5–15)
BUN: 148 mg/dL — ABNORMAL HIGH (ref 8–23)
CO2: 21 mmol/L — ABNORMAL LOW (ref 22–32)
Calcium: 8.5 mg/dL — ABNORMAL LOW (ref 8.9–10.3)
Chloride: 104 mmol/L (ref 98–111)
Creatinine, Ser: 5.47 mg/dL — ABNORMAL HIGH (ref 0.61–1.24)
GFR calc Af Amer: 11 mL/min — ABNORMAL LOW (ref >60)
GFR calc non Af Amer: 10 mL/min — ABNORMAL LOW (ref >60)
Glucose, Bld: 163 mg/dL — ABNORMAL HIGH (ref 70–99)
Phosphorus: 6.3 mg/dL — ABNORMAL HIGH (ref 2.5–4.6)
Potassium: 4.5 mmol/L (ref 3.5–5.1)
Sodium: 140 mmol/L (ref 135–145)

## 2019-02-21 LAB — MAGNESIUM: Magnesium: 3 mg/dL — ABNORMAL HIGH (ref 1.7–2.4)

## 2019-02-21 MED ORDER — GENERIC EXTERNAL MEDICATION
Status: DC
Start: ? — End: 2019-02-21

## 2019-02-21 NOTE — Progress Notes (Signed)
Pulmonary Critical Care Medicine [place]   PULMONARY CRITICAL CARE SERVICE  PROGRESS NOTE  Date of Service: 02/21/2019  Paul Ball  [id number]  DOB: Mar 19, 1946   DOA: 02/15/2019  Referring Physician: [name], MD  HPI: Paul Ball is a 73 y.o. male seen for follow up of Acute on Chronic Respiratory Failure.  Patient currently is on full support on the ventilator today supposed to wean on pressure support for 12 hours  Medications: Reviewed on Rounds  Physical Exam:  Vitals: Temperature 97.4 pulse 71 respiratory rate 22 blood pressure 154/76 saturations 99%  Ventilator Settings mode ventilation assist control FiO2 40% tidal volume 445 PEEP 5  . General: Comfortable at this time . Eyes: Grossly normal lids, irises & conjunctiva . ENT: grossly tongue is normal . Neck: no obvious mass . Cardiovascular: S1 S2 normal no gallop . Respiratory: No rhonchi no rales are noted at this time . Abdomen: soft . Skin: no rash seen on limited exam . Musculoskeletal: not rigid . Psychiatric:unable to assess . Neurologic: no seizure no involuntary movements         Lab Data:   Basic Metabolic Panel: Recent Labs  Lab 02/17/19 0806 02/18/19 0524 02/19/19 0724 02/19/19 1455 02/20/19 0706 02/21/19 0728  NA 142 140 142  --  144 140  K 5.1 5.0 4.8  --  4.8 4.5  CL 103 102 103  --  105 104  CO2 28 24 26   --  26 21*  GLUCOSE 200* 238* 165*  --  128* 163*  BUN 160* 170* 176*  --  175* 148*  CREATININE 4.95* 5.33* 5.61*  --  5.92* 5.47*  CALCIUM 9.0 9.3 9.3 9.0 9.3 8.5*  MG 3.6* 3.6* 3.6*  --  3.5* 3.0*  PHOS 6.6* 6.3* 6.4*  --  6.6* 6.3*    ABG: Recent Labs  Lab 02/15/19 1930  PHART 7.396  PCO2ART 46.3  PO2ART 51.8*  HCO3 27.8  O2SAT 81.7    Liver Function Tests: Recent Labs  Lab 02/16/19 0658 02/17/19 0806 02/18/19 0524 02/19/19 0724 02/20/19 0706 02/21/19 0728  AST 21  --   --   --   --   --   ALT 15  --   --   --   --   --    ALKPHOS 112  --   --   --   --   --   BILITOT 1.1  --   --   --   --   --   PROT 6.0*  --   --   --   --   --   ALBUMIN 1.9* 1.8* 1.8* 1.8* 1.7* 1.7*   No results for input(s): LIPASE, AMYLASE in the last 168 hours. No results for input(s): AMMONIA in the last 168 hours.  CBC: Recent Labs  Lab 02/16/19 0658 02/17/19 0806 02/18/19 0524 02/19/19 0724 02/20/19 0706 02/21/19 0728  WBC 8.6 9.5 7.3 7.1 7.9 7.0  NEUTROABS 7.4  --   --   --   --   --   HGB 8.6* 8.6* 8.8* 9.0* 8.6* 7.5*  HCT 27.0* 28.4* 27.3* 27.5* 26.4* 24.3*  MCV 86.3 91.0 87.5 85.9 86.6 90.0  PLT 185 224 193 179 171 181    Cardiac Enzymes: No results for input(s): CKTOTAL, CKMB, CKMBINDEX, TROPONINI in the last 168 hours.  BNP (last 3 results) No results for input(s): BNP in the last 8760 hours.  ProBNP (last 3 results) No results for  input(s): PROBNP in the last 8760 hours.  Radiological Exams: Ir Cyndy Freeze Guide Cv Line Right  Result Date: 02/19/2019 INDICATION: 73 year old with renal failure and request for a hemodialysis catheter. EXAM: FLUOROSCOPIC AND ULTRASOUND GUIDED PLACEMENT OF A NON-TUNNELED DIALYSIS CATHETER Physician: Stephan Minister. Henn, MD MEDICATIONS: None ANESTHESIA/SEDATION: None FLUOROSCOPY TIME:  Fluoroscopy Time: 30 seconds, 1.7 mGy COMPLICATIONS: None immediate. PROCEDURE: Informed consent was obtained for catheter placement. The patient was placed supine on the interventional table. Ultrasound confirmed a patent right internal jugular vein. Ultrasound images were obtained for documentation. The right side of the neck was prepped and draped in a sterile fashion. The right neck was anesthetized with 1% lidocaine. Maximal barrier sterile technique was utilized including caps, mask, sterile gowns, sterile gloves, sterile drape, hand hygiene and skin antiseptic. A small incision was made with #11 blade scalpel. A 21 gauge needle directed into the right internal jugular vein with ultrasound guidance. A  micropuncture dilator set was placed. A 20 cm Mahurkar catheter was selected. The catheter was advanced over a wire and positioned in the lower SVC. Fluoroscopic images were obtained for documentation. Both dialysis lumens were found to aspirate and flush well. The proper amount of heparin was flushed in both lumens. The central venous lumen was flushed with normal saline. Catheter was sutured to skin. FINDINGS: Catheter tip in the lower SVC. IMPRESSION: Successful placement of a right jugular non-tunneled dialysis catheter using ultrasound and fluoroscopic guidance. Electronically Signed   By: Markus Daft M.D.   On: 02/19/2019 15:44   Ir US Guide Vasc Access Right  Result Date: 02/19/2019 INDICATION: 73 year old with renal failure and request for a hemodialysis catheter. EXAM: FLUOROSCOPIC AND ULTRASOUND GUIDED PLACEMENT OF A NON-TUNNELED DIALYSIS CATHETER Physician: Stephan Minister. Henn, MD MEDICATIONS: None ANESTHESIA/SEDATION: None FLUOROSCOPY TIME:  Fluoroscopy Time: 30 seconds, 1.7 mGy COMPLICATIONS: None immediate. PROCEDURE: Informed consent was obtained for catheter placement. The patient was placed supine on the interventional table. Ultrasound confirmed a patent right internal jugular vein. Ultrasound images were obtained for documentation. The right side of the neck was prepped and draped in a sterile fashion. The right neck was anesthetized with 1% lidocaine. Maximal barrier sterile technique was utilized including caps, mask, sterile gowns, sterile gloves, sterile drape, hand hygiene and skin antiseptic. A small incision was made with #11 blade scalpel. A 21 gauge needle directed into the right internal jugular vein with ultrasound guidance. A micropuncture dilator set was placed. A 20 cm Mahurkar catheter was selected. The catheter was advanced over a wire and positioned in the lower SVC. Fluoroscopic images were obtained for documentation. Both dialysis lumens were found to aspirate and flush well. The  proper amount of heparin was flushed in both lumens. The central venous lumen was flushed with normal saline. Catheter was sutured to skin. FINDINGS: Catheter tip in the lower SVC. IMPRESSION: Successful placement of a right jugular non-tunneled dialysis catheter using ultrasound and fluoroscopic guidance. Electronically Signed   By: Markus Daft M.D.   On: 02/19/2019 15:44   Dg Chest Port 1 View  Result Date: 02/21/2019 CLINICAL DATA:  Pneumonia. Ventilator dependent. Respiratory failure. EXAM: PORTABLE CHEST 1 VIEW COMPARISON:  02/16/2019 FINDINGS: Tracheostomy tube tip at the thoracic inlet. Right internal jugular dialysis catheter tip at the atrial caval junction. Presumed overlying artifact projects over the lower chest in the dialysis catheter tip. Unchanged cardiomegaly and mediastinal contours. Heterogeneous bilateral lung opacities with bilateral pleural effusions, not significantly changed. No visualized pneumothorax. IMPRESSION: 1. Unchanged appearance of the  chest at the past 3 days. 2. Unchanged bilateral lung opacities and pleural effusions. Electronically Signed   By: Keith Rake M.D.   On: 02/21/2019 06:43    Assessment/Plan Active Problems:   Acute on chronic respiratory failure with hypoxia (HCC)   Acute on chronic systolic and diastolic heart failure, NYHA class 3 (HCC)   Acute renal failure superimposed on stage 3 chronic kidney disease (HCC)   Healthcare-associated pneumonia   COPD, severe ([place])   1. Acute on chronic respiratory failure with hypoxia we will continue with wean protocol today the goal is for 12 hours 2. Acute on chronic diastolic heart failure monitor fluid status 3. Acute renal failure we will follow-up on nephrology recommendations. 4. Healthcare associated pneumonia treated 5. Severe COPD continue with present management nebulizers as necessary   I have personally seen and evaluated the patient, evaluated laboratory and imaging results, formulated the  assessment and plan and placed orders. The Patient requires high complexity decision making for assessment and support.  Case was discussed on Rounds with the Respiratory Therapy Staff  Allyne Gee, MD [facility] Pulmonary Critical Care Medicine Sleep Medicine

## 2019-02-21 NOTE — Consult Note (Signed)
[facility] CONSULT NOTE    Date: 02/21/2019                  Patient Name:  Paul Ball  MRN: [id number]  DOB: [birth year]  Age / Sex: 73 y.o., male         PCP: System, Pcp Not In                 Service Requesting Consult:  Hospitalist                 Reason for Consult:  Acute kidney injury            History of Present Illness: Patient is a 73 y.o. male with a PMHx of BPH, allergic rhinitis, COPD, diabetes mellitus type 2, hypertension, GERD, acute respiratory failure, congestive heart failure who was admitted to [facility] on 02/15/2019 for ongoing management of acute respiratory failure.  He was admitted to [facility] from January 20, 2019 to February 15, 2019.  When he presented to [facility] he presented with acute respiratory failure.  He initially required ventilatory support and was provided with broad-spectrum IV antibiotics as well as IV steroids.  Later in the course of his hospitalization he worsened from a respiratory perspective.  Subsequently tracheostomy and PEG tube were placed.  Patient has known chronic kidney disease stage IIIb with baseline EGFR of 43.  Renal function significantly deteriorated now as BUN currently 152 with a creatinine of 4.54.  He also became oliguric.  Therefore hemodialysis was initiated yesterday and patient underwent short treatment.   Medications: Ampicillin 2 g every 12 hours, calcium gluconate 4.65 mEq x 1 week, Humalog sliding scale insulin, hydralazine 20 mg 3 times daily, amlodipine 5 mg daily, atorvastatin 20 mg nightly, Brovana 15 mcg inhaled twice daily, budesonide 0.5 mg inhaled twice daily, carvedilol 3.125 mg twice daily, donepezil 10 mg nightly, famotidine 10 mg daily, fish oil 4 g daily, heparin 5000 units every 8 hours, Lantus 10 units daily, modafinil 100 mg every morning, Renvela 0.8 g 3 times daily, sertraline 25 mg daily  Allergies: Shellfish   Past Medical  History: Past Medical History:  Diagnosis Date  . Acute on chronic respiratory failure with hypoxia ([place])   . Acute on chronic systolic and diastolic heart failure, NYHA class 3 ([place])   . Acute renal failure superimposed on stage 3 chronic kidney disease ([place])   . COPD, severe ([place])   . Healthcare-associated pneumonia   Congestive heart failure Hypertension Diabetes mellitus type 2    Past Surgical History: PEG tube placement Tracheostomy placement Temporary dialysis catheter right internal jugular vein  Family History: Unable to obtain from the patient as he is currently on the ventilator.  Social History: Unable to obtain from the patient as he is currently on the ventilator.  Review of Systems: Unable to obtain from the patient as he is currently on the ventilator.  Vital Signs: Temperature 97.4 pulse 71 respirations 22 blood pressure 154/76 Weight trends: There were no vitals filed for this visit.  Physical Exam: General: Critically ill-appearing  Head: Normocephalic, atraumatic.  Eyes: Anicteric, EOMI  Nose: Mucous membranes moist, not inflammed, nonerythematous.  Throat: Oropharynx nonerythematous, no exudate appreciated.   Neck: Tracheostomy in place  Lungs:  Bilateral rales and rhonchi, vent assisted  Heart: S1S2 no rubs  Abdomen:  Soft, NTND, PEG in place  Extremities: 1+ B/L LE edema  Neurologic: Arousable but not following commands  Skin:  No visible rashes, scars.    Lab results: Basic Metabolic Panel: Recent Labs  Lab 02/16/19 0658 02/17/19 0806 02/18/19 0524 02/19/19 0724 02/19/19 1455 02/20/19 0706  NA 142 142 140 142  --  144  K 4.9 5.1 5.0 4.8  --  4.8  CL 102 103 102 103  --  105  CO2 _0 --  26  GLUCOSE 182* 200* 238* 165*  --  128*  BUN 152* 160* 170* 176*  --  175*  CREATININE 4.54* 4.95* 5.33* 5.61*  --  5.92*  CALCIUM 9.0 9.0 9.3 9.3 9.0 9.3  MG 3.2* 3.6* 3.6* 3.6*  --  3.5*  PHOS 5.2* 6.6* 6.3* 6.4*  --  6.6*     Liver Function Tests: Recent Labs  Lab 02/16/19 0658  02/18/19 0524 02/19/19 0724 02/20/19 0706  AST 21  --   --   --   --   ALT 15  --   --   --   --   ALKPHOS 112  --   --   --   --   BILITOT 1.1  --   --   --   --   PROT 6.0*  --   --   --   --   ALBUMIN 1.9*   < > 1.8* 1.8* 1.7*   < > = values in this interval not displayed.   No results for input(s): LIPASE, AMYLASE in the last 168 hours. No results for input(s): AMMONIA in the last 168 hours.  CBC: Recent Labs  Lab 02/16/19 0658 02/17/19 0806 02/18/19 0524 02/19/19 0724 02/20/19 0706  WBC 8.6 9.5 7.3 7.1 7.9  NEUTROABS 7.4  --   --   --   --   HGB 8.6* 8.6* 8.8* 9.0* 8.6*  HCT 27.0* 28.4* 27.3* 27.5* 26.4*  MCV 86.3 91.0 87.5 85.9 86.6  PLT 185 224 193 179 171    Cardiac Enzymes: No results for input(s): CKTOTAL, CKMB, CKMBINDEX, TROPONINI in the last 168 hours.  BNP: Invalid input(s): POCBNP  CBG: No results for input(s): GLUCAP in the last 168 hours.  Microbiology: Results for orders placed or performed during the hospital encounter of 02/15/19  Culture, Urine     Status: None   Collection Time: 02/15/19 12:52 AM   Specimen: Urine, Random  Result Value Ref Range Status   Specimen Description URINE, RANDOM  Final   Special Requests NONE  Final   Culture   Final    NO GROWTH Performed at [facility], [place] [address] [place], [place] [address]    Report Status 02/17/2019 FINAL  Final  Culture, respiratory (non-expectorated)     Status: None   Collection Time: 02/15/19 10:50 PM   Specimen: Tracheal Aspirate; Respiratory  Result Value Ref Range Status   Specimen Description TRACHEAL ASPIRATE  Final   Special Requests NONE  Final   Gram Stain   Final    NO WBC SEEN RARE GRAM POSITIVE COCCI IN PAIRS Performed at [facility], [address], [place],  [address]    Culture MODERATE ENTEROCOCCUS FAECALIS  Final   Report Status 02/19/2019 FINAL  Final   Organism ID, Bacteria  ENTEROCOCCUS FAECALIS  Final      Susceptibility   Enterococcus faecalis - MIC*    AMPICILLIN <=2 SENSITIVE Sensitive     VANCOMYCIN 1 SENSITIVE Sensitive     GENTAMICIN SYNERGY RESISTANT Resistant     * MODERATE ENTEROCOCCUS  FAECALIS  SARS Coronavirus 2 by RT PCR (hospital order, performed in [facility] hospital lab) Nasopharyngeal Nasopharyngeal Swab     Status: None   Collection Time: 02/16/19  2:20 PM   Specimen: Nasopharyngeal Swab  Result Value Ref Range Status   SARS Coronavirus 2 NEGATIVE NEGATIVE Final    Comment: (NOTE) If result is NEGATIVE SARS-CoV-2 target nucleic acids are NOT DETECTED. The SARS-CoV-2 RNA is generally detectable in upper and lower  respiratory specimens during the acute phase of infection. The lowest  concentration of SARS-CoV-2 viral copies this assay can detect is 250  copies / mL. A negative result does not preclude SARS-CoV-2 infection  and should not be used as the sole basis for treatment or other  patient management decisions.  A negative result may occur with  improper specimen collection / handling, submission of specimen other  than nasopharyngeal swab, presence of viral mutation(s) within the  areas targeted by this assay, and inadequate number of viral copies  (<250 copies / mL). A negative result must be combined with clinical  observations, patient history, and epidemiological information. If result is POSITIVE SARS-CoV-2 target nucleic acids are DETECTED. The SARS-CoV-2 RNA is generally detectable in upper and lower  respiratory specimens dur ing the acute phase of infection.  Positive  results are indicative of active infection with SARS-CoV-2.  Clinical  correlation with patient history and other diagnostic information is  necessary to determine patient infection status.  Positive results do  not rule out bacterial infection or co-infection with other viruses. If result is PRESUMPTIVE POSTIVE SARS-CoV-2 nucleic acids MAY BE PRESENT.    A presumptive positive result was obtained on the submitted specimen  and confirmed on repeat testing.  While 2019 novel coronavirus  (SARS-CoV-2) nucleic acids may be present in the submitted sample  additional confirmatory testing may be necessary for epidemiological  and / or clinical management purposes  to differentiate between  SARS-CoV-2 and other Sarbecovirus currently known to infect humans.  If clinically indicated additional testing with an alternate test  methodology [phone]) is advised. The SARS-CoV-2 RNA is generally  detectable in upper and lower respiratory sp ecimens during the acute  phase of infection. The expected result is Negative. Fact Sheet for Patients:  StrictlyIdeas.no Fact Sheet for Healthcare Providers: BankingDealers.co.za This test is not yet approved or cleared by the [place] FDA and has been authorized for detection and/or diagnosis of SARS-CoV-2 by FDA under an Emergency Use Authorization (EUA).  This EUA will remain in effect (meaning this test can be used) for the duration of the COVID-19 declaration under Section 564(b)(1) of the Act, 21 U.S.C. section 360bbb-3(b)(1), unless the authorization is terminated or revoked sooner. Performed at [facility], [place] [address], [place], [place] [address]     Coagulation Studies: No results for input(s): LABPROT, INR in the last 72 hours.  Urinalysis: No results for input(s): COLORURINE, LABSPEC, PHURINE, GLUCOSEU, HGBUR, BILIRUBINUR, KETONESUR, PROTEINUR, UROBILINOGEN, NITRITE, LEUKOCYTESUR in the last 72 hours.  Invalid input(s): APPERANCEUR    Imaging: Ir Fluoro Guide Cv Line Right  Result Date: 02/19/2019 INDICATION: 73 year old with renal failure and request for a hemodialysis catheter. EXAM: FLUOROSCOPIC AND ULTRASOUND GUIDED PLACEMENT OF A NON-TUNNELED DIALYSIS CATHETER Physician: Stephan Minister. Henn, MD MEDICATIONS: None  ANESTHESIA/SEDATION: None FLUOROSCOPY TIME:  Fluoroscopy Time: 30 seconds, 1.7 mGy COMPLICATIONS: None immediate. PROCEDURE: Informed consent was obtained for catheter placement. The patient was placed supine on the interventional table. Ultrasound confirmed a patent right internal  jugular vein. Ultrasound images were obtained for documentation. The right side of the neck was prepped and draped in a sterile fashion. The right neck was anesthetized with 1% lidocaine. Maximal barrier sterile technique was utilized including caps, mask, sterile gowns, sterile gloves, sterile drape, hand hygiene and skin antiseptic. A small incision was made with #11 blade scalpel. A 21 gauge needle directed into the right internal jugular vein with ultrasound guidance. A micropuncture dilator set was placed. A 20 cm Mahurkar catheter was selected. The catheter was advanced over a wire and positioned in the lower SVC. Fluoroscopic images were obtained for documentation. Both dialysis lumens were found to aspirate and flush well. The proper amount of heparin was flushed in both lumens. The central venous lumen was flushed with normal saline. Catheter was sutured to skin. FINDINGS: Catheter tip in the lower SVC. IMPRESSION: Successful placement of a right jugular non-tunneled dialysis catheter using ultrasound and fluoroscopic guidance. Electronically Signed   By: Markus Daft M.D.   On: 02/19/2019 15:44   Ir US Guide Vasc Access Right  Result Date: 02/19/2019 INDICATION: 73 year old with renal failure and request for a hemodialysis catheter. EXAM: FLUOROSCOPIC AND ULTRASOUND GUIDED PLACEMENT OF A NON-TUNNELED DIALYSIS CATHETER Physician: Stephan Minister. Henn, MD MEDICATIONS: None ANESTHESIA/SEDATION: None FLUOROSCOPY TIME:  Fluoroscopy Time: 30 seconds, 1.7 mGy COMPLICATIONS: None immediate. PROCEDURE: Informed consent was obtained for catheter placement. The patient was placed supine on the interventional table. Ultrasound confirmed a patent  right internal jugular vein. Ultrasound images were obtained for documentation. The right side of the neck was prepped and draped in a sterile fashion. The right neck was anesthetized with 1% lidocaine. Maximal barrier sterile technique was utilized including caps, mask, sterile gowns, sterile gloves, sterile drape, hand hygiene and skin antiseptic. A small incision was made with #11 blade scalpel. A 21 gauge needle directed into the right internal jugular vein with ultrasound guidance. A micropuncture dilator set was placed. A 20 cm Mahurkar catheter was selected. The catheter was advanced over a wire and positioned in the lower SVC. Fluoroscopic images were obtained for documentation. Both dialysis lumens were found to aspirate and flush well. The proper amount of heparin was flushed in both lumens. The central venous lumen was flushed with normal saline. Catheter was sutured to skin. FINDINGS: Catheter tip in the lower SVC. IMPRESSION: Successful placement of a right jugular non-tunneled dialysis catheter using ultrasound and fluoroscopic guidance. Electronically Signed   By: Markus Daft M.D.   On: 02/19/2019 15:44   Dg Chest Port 1 View  Result Date: 02/21/2019 CLINICAL DATA:  Pneumonia. Ventilator dependent. Respiratory failure. EXAM: PORTABLE CHEST 1 VIEW COMPARISON:  02/16/2019 FINDINGS: Tracheostomy tube tip at the thoracic inlet. Right internal jugular dialysis catheter tip at the atrial caval junction. Presumed overlying artifact projects over the lower chest in the dialysis catheter tip. Unchanged cardiomegaly and mediastinal contours. Heterogeneous bilateral lung opacities with bilateral pleural effusions, not significantly changed. No visualized pneumothorax. IMPRESSION: 1. Unchanged appearance of the chest at the past 3 days. 2. Unchanged bilateral lung opacities and pleural effusions. Electronically Signed   By: Keith Rake M.D.   On: 02/21/2019 06:43      Assessment & Plan: Pt is a 73  y.o. male with a PMHx of BPH, allergic rhinitis, COPD, diabetes mellitus type 2, hypertension, GERD, acute respiratory failure, congestive heart failure, chronic kidney disease stage IIIb EGFR 43 who was admitted to [facility] on 02/15/2019 for ongoing management of acute respiratory failure.   1.  Acute kidney injury/chronic kidney disease stage IIIb baseline GFR 43.  Acute kidney injury likely related to concomitant illness.  Patient had severe azotemia.  Patient was initiated on dialysis yesterday.  We will plan for second dialysis treatment today with blood flow rate of 250, dialysate flow rate of 500, no ultrafiltration at the moment.  2.  Acute respiratory failure.  Patient remains on the ventilator at this point time.  Continue current ventilatory support.  3.  Anemia of chronic kidney disease.  Hemoglobin currently 8.6.  Consider Epogen over the course of the hospitalization.  4.  Thanks for consultation.

## 2019-02-22 DIAGNOSIS — J449 Chronic obstructive pulmonary disease, unspecified: Secondary | ICD-10-CM | POA: Diagnosis not present

## 2019-02-22 DIAGNOSIS — N179 Acute kidney failure, unspecified: Secondary | ICD-10-CM | POA: Diagnosis not present

## 2019-02-22 DIAGNOSIS — I5043 Acute on chronic combined systolic (congestive) and diastolic (congestive) heart failure: Secondary | ICD-10-CM | POA: Diagnosis not present

## 2019-02-22 DIAGNOSIS — J9621 Acute and chronic respiratory failure with hypoxia: Secondary | ICD-10-CM | POA: Diagnosis not present

## 2019-02-22 LAB — CBC
HCT: 25.1 % — ABNORMAL LOW (ref 39.0–52.0)
Hemoglobin: 7.8 g/dL — ABNORMAL LOW (ref 13.0–17.0)
MCH: 27.7 pg (ref 26.0–34.0)
MCHC: 31.1 g/dL (ref 30.0–36.0)
MCV: 89 fL (ref 80.0–100.0)
Platelets: 166 10*3/uL (ref 150–400)
RBC: 2.82 MIL/uL — ABNORMAL LOW (ref 4.22–5.81)
RDW: 15.2 % (ref 11.5–15.5)
WBC: 6.6 10*3/uL (ref 4.0–10.5)
nRBC: 0 % (ref 0.0–0.2)

## 2019-02-22 LAB — BASIC METABOLIC PANEL
Anion gap: 12 (ref 5–15)
BUN: 136 mg/dL — ABNORMAL HIGH (ref 8–23)
CO2: 25 mmol/L (ref 22–32)
Calcium: 8.8 mg/dL — ABNORMAL LOW (ref 8.9–10.3)
Chloride: 102 mmol/L (ref 98–111)
Creatinine, Ser: 5.36 mg/dL — ABNORMAL HIGH (ref 0.61–1.24)
GFR calc Af Amer: 11 mL/min — ABNORMAL LOW (ref >60)
GFR calc non Af Amer: 10 mL/min — ABNORMAL LOW (ref >60)
Glucose, Bld: 113 mg/dL — ABNORMAL HIGH (ref 70–99)
Potassium: 4.6 mmol/L (ref 3.5–5.1)
Sodium: 139 mmol/L (ref 135–145)

## 2019-02-22 LAB — MAGNESIUM: Magnesium: 3.2 mg/dL — ABNORMAL HIGH (ref 1.7–2.4)

## 2019-02-22 NOTE — Progress Notes (Signed)
Pulmonary Critical Care Medicine [place]   PULMONARY CRITICAL CARE SERVICE  PROGRESS NOTE  Date of Service: 02/22/2019  Paul Ball  [name]  DOB: Nov 23, 1945   DOA: 02/15/2019  Referring Physician: [name], MD  HPI: Paul Ball is a 73 y.o. male seen for follow up of Acute on Chronic Respiratory Failure.  Patient is on full support did not tolerate weaning yesterday still in fluid overload and nephrology is working on getting him a dialyzed hopefully  Medications: Reviewed on Rounds  Physical Exam:  Vitals: Temperature 97.0 pulse 50 respiratory rate 17 blood pressure 153/54 saturations 95%  Ventilator Settings mode ventilation assist control FiO2 40% tidal volume 420 PEEP 5  . General: Comfortable at this time . Eyes: Grossly normal lids, irises & conjunctiva . ENT: grossly tongue is normal . Neck: no obvious mass . Cardiovascular: S1 S2 normal no gallop . Respiratory: No rhonchi no rales are noted at this time . Abdomen: soft . Skin: no rash seen on limited exam . Musculoskeletal: not rigid . Psychiatric:unable to assess . Neurologic: no seizure no involuntary movements         Lab Data:   Basic Metabolic Panel: Recent Labs  Lab 02/17/19 0806 02/18/19 0524 02/19/19 0724 02/19/19 1455 02/20/19 0706 02/21/19 0728  NA 142 140 142  --  144 140  K 5.1 5.0 4.8  --  4.8 4.5  CL 103 102 103  --  105 104  CO2 28 24 26   --  26 21*  GLUCOSE 200* 238* 165*  --  128* 163*  BUN 160* 170* 176*  --  175* 148*  CREATININE 4.95* 5.33* 5.61*  --  5.92* 5.47*  CALCIUM 9.0 9.3 9.3 9.0 9.3 8.5*  MG 3.6* 3.6* 3.6*  --  3.5* 3.0*  PHOS 6.6* 6.3* 6.4*  --  6.6* 6.3*    ABG: Recent Labs  Lab 02/15/19 1930  PHART 7.396  PCO2ART 46.3  PO2ART 51.8*  HCO3 27.8  O2SAT 81.7    Liver Function Tests: Recent Labs  Lab 02/16/19 0658 02/17/19 0806 02/18/19 0524 02/19/19 0724 02/20/19 0706 02/21/19 0728  AST 21  --   --   --   --   --    ALT 15  --   --   --   --   --   ALKPHOS 112  --   --   --   --   --   BILITOT 1.1  --   --   --   --   --   PROT 6.0*  --   --   --   --   --   ALBUMIN 1.9* 1.8* 1.8* 1.8* 1.7* 1.7*   No results for input(s): LIPASE, AMYLASE in the last 168 hours. No results for input(s): AMMONIA in the last 168 hours.  CBC: Recent Labs  Lab 02/16/19 0658  02/18/19 0524 02/19/19 0724 02/20/19 0706 02/21/19 0728 02/22/19 0525  WBC 8.6   < > 7.3 7.1 7.9 7.0 6.6  NEUTROABS 7.4  --   --   --   --   --   --   HGB 8.6*   < > 8.8* 9.0* 8.6* 7.5* 7.8*  HCT 27.0*   < > 27.3* 27.5* 26.4* 24.3* 25.1*  MCV 86.3   < > 87.5 85.9 86.6 90.0 89.0  PLT 185   < > 193 179 171 181 166   < > = values in this interval not displayed.  Cardiac Enzymes: No results for input(s): CKTOTAL, CKMB, CKMBINDEX, TROPONINI in the last 168 hours.  BNP (last 3 results) No results for input(s): BNP in the last 8760 hours.  ProBNP (last 3 results) No results for input(s): PROBNP in the last 8760 hours.  Radiological Exams: Dg Chest Port 1 View  Result Date: 02/21/2019 CLINICAL DATA:  Pneumonia. Ventilator dependent. Respiratory failure. EXAM: PORTABLE CHEST 1 VIEW COMPARISON:  02/16/2019 FINDINGS: Tracheostomy tube tip at the thoracic inlet. Right internal jugular dialysis catheter tip at the atrial caval junction. Presumed overlying artifact projects over the lower chest in the dialysis catheter tip. Unchanged cardiomegaly and mediastinal contours. Heterogeneous bilateral lung opacities with bilateral pleural effusions, not significantly changed. No visualized pneumothorax. IMPRESSION: 1. Unchanged appearance of the chest at the past 3 days. 2. Unchanged bilateral lung opacities and pleural effusions. Electronically Signed   By: Keith Rake M.D.   On: 02/21/2019 06:43    Assessment/Plan Active Problems:   Acute on chronic respiratory failure with hypoxia (HCC)   Acute on chronic systolic and diastolic heart failure,  NYHA class 3 (HCC)   Acute renal failure superimposed on stage 3 chronic kidney disease (HCC)   Healthcare-associated pneumonia   COPD, severe ([place])   1. Acute on chronic respiratory failure hypoxia plan is to continue with full support on the ventilator check the RSB I mechanics once the patient's fluid status is improved hopefully we can resume the weaning 2. Acute on chronic systolic heart failure still with significant fluid overload based on last chest x-ray 3. Acute renal failure following with nephrology recommendations 4. Healthcare associated pneumonia treated 5. Severe COPD at baseline we will continue present management   I have personally seen and evaluated the patient, evaluated laboratory and imaging results, formulated the assessment and plan and placed orders. The Patient requires high complexity decision making for assessment and support.  Case was discussed on Rounds with the Respiratory Therapy Staff  Allyne Gee, MD [facility] Pulmonary Critical Care Medicine Sleep Medicine

## 2019-02-23 DIAGNOSIS — N179 Acute kidney failure, unspecified: Secondary | ICD-10-CM | POA: Diagnosis not present

## 2019-02-23 DIAGNOSIS — J9621 Acute and chronic respiratory failure with hypoxia: Secondary | ICD-10-CM | POA: Diagnosis not present

## 2019-02-23 DIAGNOSIS — I5043 Acute on chronic combined systolic (congestive) and diastolic (congestive) heart failure: Secondary | ICD-10-CM | POA: Diagnosis not present

## 2019-02-23 DIAGNOSIS — J449 Chronic obstructive pulmonary disease, unspecified: Secondary | ICD-10-CM | POA: Diagnosis not present

## 2019-02-23 LAB — CBC
HCT: 22.4 % — ABNORMAL LOW (ref 39.0–52.0)
Hemoglobin: 7.2 g/dL — ABNORMAL LOW (ref 13.0–17.0)
MCH: 27.9 pg (ref 26.0–34.0)
MCHC: 32.1 g/dL (ref 30.0–36.0)
MCV: 86.8 fL (ref 80.0–100.0)
Platelets: 168 10*3/uL (ref 150–400)
RBC: 2.58 MIL/uL — ABNORMAL LOW (ref 4.22–5.81)
RDW: 15 % (ref 11.5–15.5)
WBC: 5.7 10*3/uL (ref 4.0–10.5)
nRBC: 0 % (ref 0.0–0.2)

## 2019-02-23 LAB — RENAL FUNCTION PANEL
Albumin: 1.7 g/dL — ABNORMAL LOW (ref 3.5–5.0)
Anion gap: 14 (ref 5–15)
BUN: 141 mg/dL — ABNORMAL HIGH (ref 8–23)
CO2: 23 mmol/L (ref 22–32)
Calcium: 8.8 mg/dL — ABNORMAL LOW (ref 8.9–10.3)
Chloride: 102 mmol/L (ref 98–111)
Creatinine, Ser: 5.87 mg/dL — ABNORMAL HIGH (ref 0.61–1.24)
GFR calc Af Amer: 10 mL/min — ABNORMAL LOW (ref >60)
GFR calc non Af Amer: 9 mL/min — ABNORMAL LOW (ref >60)
Glucose, Bld: 127 mg/dL — ABNORMAL HIGH (ref 70–99)
Phosphorus: 6.8 mg/dL — ABNORMAL HIGH (ref 2.5–4.6)
Potassium: 4.5 mmol/L (ref 3.5–5.1)
Sodium: 139 mmol/L (ref 135–145)

## 2019-02-23 NOTE — Progress Notes (Signed)
[facility]  ROUNDING NOTE   Subjective:  Patient due for another dialysis session today. Remains critically ill. Still on the ventilator.    Objective:  Vital signs in last 24 hours:  Temperature 96.4 pulse 68 respirations 22 blood pressure 154/80  Physical Exam: General: Critically ill-appearing  Head: Normocephalic, atraumatic. Moist oral mucosal membranes  Eyes: Anicteric  Neck: Tracheostomy in place  Lungs:  Scattered rhonchi, vent assisted  Heart: S1S2 no rubs  Abdomen:  Soft, nontender, bowel sounds present, PEG in place  Extremities: 2+ peripheral edema.  Neurologic: Not following commands  Skin: No rash  Access: Right internal jugular temporary dialysis catheter    Basic Metabolic Panel: Recent Labs  Lab 02/18/19 0524 02/19/19 0724  02/20/19 0706 02/21/19 0728 02/22/19 1259 02/23/19 0554  NA 140 142  --  144 140 139 139  K 5.0 4.8  --  4.8 4.5 4.6 4.5  CL 102 103  --  105 104 102 102  CO2 24 26  --  26 21* 25 23  GLUCOSE 238* 165*  --  128* 163* 113* 127*  BUN 170* 176*  --  175* 148* 136* 141*  CREATININE 5.33* 5.61*  --  5.92* 5.47* 5.36* 5.87*  CALCIUM 9.3 9.3   < > 9.3 8.5* 8.8* 8.8*  MG 3.6* 3.6*  --  3.5* 3.0* 3.2*  --   PHOS 6.3* 6.4*  --  6.6* 6.3*  --  6.8*   < > = values in this interval not displayed.    Liver Function Tests: Recent Labs  Lab 02/18/19 0524 02/19/19 0724 02/20/19 0706 02/21/19 0728 02/23/19 0554  ALBUMIN 1.8* 1.8* 1.7* 1.7* 1.7*   No results for input(s): LIPASE, AMYLASE in the last 168 hours. No results for input(s): AMMONIA in the last 168 hours.  CBC: Recent Labs  Lab 02/19/19 0724 02/20/19 0706 02/21/19 0728 02/22/19 0525 02/23/19 0554  WBC 7.1 7.9 7.0 6.6 5.7  HGB 9.0* 8.6* 7.5* 7.8* 7.2*  HCT 27.5* 26.4* 24.3* 25.1* 22.4*  MCV 85.9 86.6 90.0 89.0 86.8  PLT 179 171 181 166 168    Cardiac Enzymes: No results for input(s): CKTOTAL, CKMB, CKMBINDEX, TROPONINI in the last 168  hours.  BNP: Invalid input(s): POCBNP  CBG: No results for input(s): GLUCAP in the last 168 hours.  Microbiology: Results for orders placed or performed during the hospital encounter of 02/15/19  Culture, Urine     Status: None   Collection Time: 02/15/19 12:52 AM   Specimen: Urine, Random  Result Value Ref Range Status   Specimen Description URINE, RANDOM  Final   Special Requests NONE  Final   Culture   Final    NO GROWTH Performed at [facility], [place] [address] [place], [place] [address]    Report Status 02/17/2019 FINAL  Final  Culture, respiratory (non-expectorated)     Status: None   Collection Time: 02/15/19 10:50 PM   Specimen: Tracheal Aspirate; Respiratory  Result Value Ref Range Status   Specimen Description TRACHEAL ASPIRATE  Final   Special Requests NONE  Final   Gram Stain   Final    NO WBC SEEN RARE GRAM POSITIVE COCCI IN PAIRS Performed at [facility], [address], [place], [place] [address]    Culture MODERATE ENTEROCOCCUS FAECALIS  Final   Report Status 02/19/2019 FINAL  Final   Organism ID, Bacteria ENTEROCOCCUS FAECALIS  Final      Susceptibility   Enterococcus faecalis -  MIC*    AMPICILLIN <=2 SENSITIVE Sensitive     VANCOMYCIN 1 SENSITIVE Sensitive     GENTAMICIN SYNERGY RESISTANT Resistant     * MODERATE ENTEROCOCCUS FAECALIS  SARS Coronavirus 2 by RT PCR (hospital order, performed in [facility] hospital lab) Nasopharyngeal Nasopharyngeal Swab     Status: None   Collection Time: 02/16/19  2:20 PM   Specimen: Nasopharyngeal Swab  Result Value Ref Range Status   SARS Coronavirus 2 NEGATIVE NEGATIVE Final    Comment: (NOTE) If result is NEGATIVE SARS-CoV-2 target nucleic acids are NOT DETECTED. The SARS-CoV-2 RNA is generally detectable in upper and lower  respiratory specimens during the acute phase of infection. The lowest  concentration of SARS-CoV-2 viral copies this assay can detect is 250  copies / mL. A negative result  does not preclude SARS-CoV-2 infection  and should not be used as the sole basis for treatment or other  patient management decisions.  A negative result may occur with  improper specimen collection / handling, submission of specimen other  than nasopharyngeal swab, presence of viral mutation(s) within the  areas targeted by this assay, and inadequate number of viral copies  (<250 copies / mL). A negative result must be combined with clinical  observations, patient history, and epidemiological information. If result is POSITIVE SARS-CoV-2 target nucleic acids are DETECTED. The SARS-CoV-2 RNA is generally detectable in upper and lower  respiratory specimens dur ing the acute phase of infection.  Positive  results are indicative of active infection with SARS-CoV-2.  Clinical  correlation with patient history and other diagnostic information is  necessary to determine patient infection status.  Positive results do  not rule out bacterial infection or co-infection with other viruses. If result is PRESUMPTIVE POSTIVE SARS-CoV-2 nucleic acids MAY BE PRESENT.   A presumptive positive result was obtained on the submitted specimen  and confirmed on repeat testing.  While 2019 novel coronavirus  (SARS-CoV-2) nucleic acids may be present in the submitted sample  additional confirmatory testing may be necessary for epidemiological  and / or clinical management purposes  to differentiate between  SARS-CoV-2 and other Sarbecovirus currently known to infect humans.  If clinically indicated additional testing with an alternate test  methodology [phone]) is advised. The SARS-CoV-2 RNA is generally  detectable in upper and lower respiratory sp ecimens during the acute  phase of infection. The expected result is Negative. Fact Sheet for Patients:  StrictlyIdeas.no Fact Sheet for Healthcare Providers: BankingDealers.co.za This test is not yet approved or  cleared by the [place] FDA and has been authorized for detection and/or diagnosis of SARS-CoV-2 by FDA under an Emergency Use Authorization (EUA).  This EUA will remain in effect (meaning this test can be used) for the duration of the COVID-19 declaration under Section 564(b)(1) of the Act, 21 U.S.C. section 360bbb-3(b)(1), unless the authorization is terminated or revoked sooner. Performed at [facility], [place] [address] [place], [place] [address]     Coagulation Studies: No results for input(s): LABPROT, INR in the last 72 hours.  Urinalysis: No results for input(s): COLORURINE, LABSPEC, PHURINE, GLUCOSEU, HGBUR, BILIRUBINUR, KETONESUR, PROTEINUR, UROBILINOGEN, NITRITE, LEUKOCYTESUR in the last 72 hours.  Invalid input(s): APPERANCEUR    Imaging: No results found.   Medications:     heparin, iohexol, lidocaine  Assessment/ Plan:  73 y.o. male with a PMHx of BPH, allergic rhinitis, COPD, diabetes mellitus type 2, hypertension, GERD, acute respiratory failure, congestive heart failure, chronic kidney disease stage IIIb  EGFR 43 who was admitted to [facility] on 02/15/2019 for ongoing management of acute respiratory failure.   1.  Acute kidney injury/chronic kidney disease stage IIIb baseline GFR 43.    We will plan for additional dialysis treatment today.  Continues to have significant azotemia though this has improved a bit.  2.  Acute respiratory failure.  Maintained on ventilatory support with FiO2 of 50% with a PEEP of 5.  Hopefully this can be weaned down with ultrafiltration.  3.  Anemia of chronic kidney disease.  Hemoglobin drifting down and currently 7.2.  Consider blood transfusion for hemoglobin of 7 or less.  4.  Secondary hyperparathyroidism.  Phosphorus up to 6.8.  This should begin to come down with dialysis treatments.   LOS: 0 Gennifer Potenza 10/21/20208:31 AM

## 2019-02-23 NOTE — Progress Notes (Signed)
Pulmonary Critical Care Medicine [place]   PULMONARY CRITICAL CARE SERVICE  PROGRESS NOTE  Date of Service: 02/23/2019  Paul Ball  [id number]  DOB: 12/18/1945   DOA: 02/15/2019  Referring Physician: [name], MD  HPI: Paul Ball is a 73 y.o. male seen for follow up of Acute on Chronic Respiratory Failure.  Patient currently is on full support has been on 50% FiO2 with assist control mode  Medications: Reviewed on Rounds  Physical Exam:  Vitals: Temperature 96.8 pulse 68 respiratory rate 22 blood pressure 153/80 saturations 97%  Ventilator Settings mode ventilation assist control FiO2 50% tidal line 500 PEEP 5  . General: Comfortable at this time . Eyes: Grossly normal lids, irises & conjunctiva . ENT: grossly tongue is normal . Neck: no obvious mass . Cardiovascular: S1 S2 normal no gallop . Respiratory: No rhonchi no rales are noted at this time . Abdomen: soft . Skin: no rash seen on limited exam . Musculoskeletal: not rigid . Psychiatric:unable to assess . Neurologic: no seizure no involuntary movements         Lab Data:   Basic Metabolic Panel: Recent Labs  Lab 02/18/19 0524 02/19/19 0724 02/19/19 1455 02/20/19 0706 02/21/19 0728 02/22/19 1259 02/23/19 0554  NA 140 142  --  144 140 139 139  K 5.0 4.8  --  4.8 4.5 4.6 4.5  CL 102 103  --  105 104 102 102  CO2 24 26  --  26 21* 25 23  GLUCOSE 238* 165*  --  128* 163* 113* 127*  BUN 170* 176*  --  175* 148* 136* 141*  CREATININE 5.33* 5.61*  --  5.92* 5.47* 5.36* 5.87*  CALCIUM 9.3 9.3 9.0 9.3 8.5* 8.8* 8.8*  MG 3.6* 3.6*  --  3.5* 3.0* 3.2*  --   PHOS 6.3* 6.4*  --  6.6* 6.3*  --  6.8*    ABG: No results for input(s): PHART, PCO2ART, PO2ART, HCO3, O2SAT in the last 168 hours.  Liver Function Tests: Recent Labs  Lab 02/18/19 0524 02/19/19 0724 02/20/19 0706 02/21/19 0728 02/23/19 0554  ALBUMIN 1.8* 1.8* 1.7* 1.7* 1.7*   No results for input(s): LIPASE,  AMYLASE in the last 168 hours. No results for input(s): AMMONIA in the last 168 hours.  CBC: Recent Labs  Lab 02/19/19 0724 02/20/19 0706 02/21/19 0728 02/22/19 0525 02/23/19 0554  WBC 7.1 7.9 7.0 6.6 5.7  HGB 9.0* 8.6* 7.5* 7.8* 7.2*  HCT 27.5* 26.4* 24.3* 25.1* 22.4*  MCV 85.9 86.6 90.0 89.0 86.8  PLT 179 171 181 166 168    Cardiac Enzymes: No results for input(s): CKTOTAL, CKMB, CKMBINDEX, TROPONINI in the last 168 hours.  BNP (last 3 results) No results for input(s): BNP in the last 8760 hours.  ProBNP (last 3 results) No results for input(s): PROBNP in the last 8760 hours.  Radiological Exams: No results found.  Assessment/Plan Active Problems:   Acute on chronic respiratory failure with hypoxia (HCC)   Acute on chronic systolic and diastolic heart failure, NYHA class 3 (HCC)   Acute renal failure superimposed on stage 3 chronic kidney disease (HCC)   Healthcare-associated pneumonia   COPD, severe ([place])   1. Acute on chronic respiratory failure with hypoxia continue with full support on assist control currently on 50% FiO2 with a PEEP of 5 patient is being dialyzed by nephrology will await stabilization and then reassess weaning 2. Acute on chronic diastolic heart failure cardiac input appreciated continue with present  management 3. Acute renal failure as per nephrology recommendations 4. Severe COPD at baseline 5. Healthcare associated pneumonia treated we will continue to follow   I have personally seen and evaluated the patient, evaluated laboratory and imaging results, formulated the assessment and plan and placed orders. The Patient requires high complexity decision making for assessment and support.  Case was discussed on Rounds with the Respiratory Therapy Staff  Allyne Gee, MD [facility] [place] Pulmonary Critical Care Medicine Sleep Medicine

## 2019-02-23 NOTE — Progress Notes (Signed)
Ref: System, Pcp Not In   Subjective:  Episodes of bradycardia and frequent wide complex rhythm. BP low with amlodipine use.  Objective:  Vital Signs in the last 24 hours:    Physical Exam: BP Readings from Last 1 Encounters:  No data found for BP     Wt Readings from Last 1 Encounters:  No data found for Wt    Weight change:  There is no height or weight on file to calculate BMI. HEENT: [place]/AT, Eyes-Brown, Conjunctiva-Pale, Sclera-Non-icteric Neck: No JVD, No bruit, Trachea midline. Trach collar, Lungs:  Clear, Bilateral. Cardiac:  Regular rhythm, normal S1 and S2, no S3. II/VI systolic murmur. Abdomen:  Soft, non-tender. BS present. Extremities:  No edema present. No cyanosis. No clubbing. Cooler feet bilaterally with discoloration. CNS: AxOx0..  Skin: dry.   Intake/Output from previous day: No intake/output data recorded.    Lab Results: BMET    Component Value Date/Time   NA 139 02/23/2019 0554   NA 139 02/22/2019 1259   NA 140 02/21/2019 0728   K 4.5 02/23/2019 0554   K 4.6 02/22/2019 1259   K 4.5 02/21/2019 0728   CL 102 02/23/2019 0554   CL 102 02/22/2019 1259   CL 104 02/21/2019 0728   CO2 23 02/23/2019 0554   CO2 25 02/22/2019 1259   CO2 21 (L) 02/21/2019 0728   GLUCOSE 127 (H) 02/23/2019 0554   GLUCOSE 113 (H) 02/22/2019 1259   GLUCOSE 163 (H) 02/21/2019 0728   BUN 141 (H) 02/23/2019 0554   BUN 136 (H) 02/22/2019 1259   BUN 148 (H) 02/21/2019 0728   CREATININE 5.87 (H) 02/23/2019 0554   CREATININE 5.36 (H) 02/22/2019 1259   CREATININE 5.47 (H) 02/21/2019 0728   CALCIUM 8.8 (L) 02/23/2019 0554   CALCIUM 8.8 (L) 02/22/2019 1259   CALCIUM 8.5 (L) 02/21/2019 0728   CALCIUM 9.0 02/19/2019 1455   GFRNONAA 9 (L) 02/23/2019 0554   GFRNONAA 10 (L) 02/22/2019 1259   GFRNONAA 10 (L) 02/21/2019 0728   GFRAA 10 (L) 02/23/2019 0554   GFRAA 11 (L) 02/22/2019 1259   GFRAA 11 (L) 02/21/2019 0728   CBC    Component Value Date/Time   WBC 5.7 02/23/2019  0554   RBC 2.58 (L) 02/23/2019 0554   HGB 7.2 (L) 02/23/2019 0554   HCT 22.4 (L) 02/23/2019 0554   PLT 168 02/23/2019 0554   MCV 86.8 02/23/2019 0554   MCH 27.9 02/23/2019 0554   MCHC 32.1 02/23/2019 0554   RDW 15.0 02/23/2019 0554   LYMPHSABS 0.7 02/16/2019 0658   MONOABS 0.5 02/16/2019 0658   EOSABS 0.0 02/16/2019 0658   BASOSABS 0.0 02/16/2019 0658   HEPATIC Function Panel Recent Labs    02/16/19 0658  PROT 6.0*   HEMOGLOBIN A1C No components found for: HGA1C,  MPG CARDIAC ENZYMES No results found for: CKTOTAL, CKMB, CKMBINDEX, TROPONINI BNP No results for input(s): PROBNP in the last 8760 hours. TSH Recent Labs    02/16/19 0658  TSH 2.629   CHOLESTEROL No results for input(s): CHOL in the last 8760 hours.  Scheduled Meds: Continuous Infusions: PRN Meds:.heparin, iohexol, lidocaine  Assessment/Plan: Sinus bradycardia with frequent VPCs. Acute on chronic respiratory failure COPD PVD HTN Large bilateral pleural effusion CKD V Type 2 DM S/P stroke  DC amlodipine Add amiodarone. Hold Beta-blocker for now.   LOS: 0 days   Time spent including chart review, lab review, examination, discussion with patient's doctor : 30 min   Dixie Dials  MD  02/23/2019, 10:18 AM

## 2019-02-24 ENCOUNTER — Other Ambulatory Visit (HOSPITAL_COMMUNITY): Payer: Medicare Other

## 2019-02-24 DIAGNOSIS — J9621 Acute and chronic respiratory failure with hypoxia: Secondary | ICD-10-CM | POA: Diagnosis not present

## 2019-02-24 DIAGNOSIS — I5043 Acute on chronic combined systolic (congestive) and diastolic (congestive) heart failure: Secondary | ICD-10-CM | POA: Diagnosis not present

## 2019-02-24 DIAGNOSIS — N179 Acute kidney failure, unspecified: Secondary | ICD-10-CM | POA: Diagnosis not present

## 2019-02-24 DIAGNOSIS — J449 Chronic obstructive pulmonary disease, unspecified: Secondary | ICD-10-CM | POA: Diagnosis not present

## 2019-02-24 MED ORDER — GENERIC EXTERNAL MEDICATION
Status: DC
Start: ? — End: 2019-02-24

## 2019-02-24 NOTE — Progress Notes (Addendum)
Pulmonary Critical Care Medicine [place]   PULMONARY CRITICAL CARE SERVICE  PROGRESS NOTE  Date of Service: 02/24/2019  Paul Ball  [name]  DOB: 06-May-1945   DOA: 02/15/2019  Referring Physician: [name], MD  HPI: Paul Ball is a 73 y.o. male seen for follow up of Acute on Chronic Respiratory Failure.  Patient has significant increase in the amount of secretions noted.  Patient supposed to get dialyzed today.  Medications: Reviewed on Rounds  Physical Exam:  Vitals: Temperature 96.9 pulse 58 respiratory 24 blood pressure 159/64 saturations 99%  Ventilator Settings right now is on full support on assist control mode on 50% FiO2  . General: Comfortable at this time . Eyes: Grossly normal lids, irises & conjunctiva . ENT: grossly tongue is normal . Neck: no obvious mass . Cardiovascular: S1 S2 normal no gallop . Respiratory: No rhonchi no rales are noted at this time . Abdomen: soft . Skin: no rash seen on limited exam . Musculoskeletal: not rigid . Psychiatric:unable to assess . Neurologic: no seizure no involuntary movements         Lab Data:   Basic Metabolic Panel: Recent Labs  Lab 02/18/19 0524 02/19/19 0724 02/19/19 1455 02/20/19 0706 02/21/19 0728 02/22/19 1259 02/23/19 0554  NA 140 142  --  144 140 139 139  K 5.0 4.8  --  4.8 4.5 4.6 4.5  CL 102 103  --  105 104 102 102  CO2 24 26  --  26 21* 25 23  GLUCOSE 238* 165*  --  128* 163* 113* 127*  BUN 170* 176*  --  175* 148* 136* 141*  CREATININE 5.33* 5.61*  --  5.92* 5.47* 5.36* 5.87*  CALCIUM 9.3 9.3 9.0 9.3 8.5* 8.8* 8.8*  MG 3.6* 3.6*  --  3.5* 3.0* 3.2*  --   PHOS 6.3* 6.4*  --  6.6* 6.3*  --  6.8*    ABG: No results for input(s): PHART, PCO2ART, PO2ART, HCO3, O2SAT in the last 168 hours.  Liver Function Tests: Recent Labs  Lab 02/18/19 0524 02/19/19 0724 02/20/19 0706 02/21/19 0728 02/23/19 0554  ALBUMIN 1.8* 1.8* 1.7* 1.7* 1.7*   No results for  input(s): LIPASE, AMYLASE in the last 168 hours. No results for input(s): AMMONIA in the last 168 hours.  CBC: Recent Labs  Lab 02/19/19 0724 02/20/19 0706 02/21/19 0728 02/22/19 0525 02/23/19 0554  WBC 7.1 7.9 7.0 6.6 5.7  HGB 9.0* 8.6* 7.5* 7.8* 7.2*  HCT 27.5* 26.4* 24.3* 25.1* 22.4*  MCV 85.9 86.6 90.0 89.0 86.8  PLT 179 171 181 166 168    Cardiac Enzymes: No results for input(s): CKTOTAL, CKMB, CKMBINDEX, TROPONINI in the last 168 hours.  BNP (last 3 results) No results for input(s): BNP in the last 8760 hours.  ProBNP (last 3 results) No results for input(s): PROBNP in the last 8760 hours.  Radiological Exams: Dg Chest Port 1 View  Result Date: 02/24/2019 CLINICAL DATA:  Pneumonia. EXAM: PORTABLE CHEST 1 VIEW COMPARISON:  February 21, 2019. FINDINGS: Stable cardiomediastinal silhouette. Tracheostomy tube is unchanged position. Right internal jugular catheter is unchanged in position. No pneumothorax is noted. Stable bilateral lung opacities are noted concerning for edema or pneumonia with associated pleural effusions. Bony thorax is unremarkable. IMPRESSION: Stable support apparatus. Stable bilateral lung opacities and pleural effusions as described above. Electronically Signed   By: Marijo Conception M.D.   On: 02/24/2019 07:41    Assessment/Plan Active Problems:   Acute on  chronic respiratory failure with hypoxia (HCC)   Acute on chronic systolic and diastolic heart failure, NYHA class 3 (HCC)   Acute renal failure superimposed on stage 3 chronic kidney disease (HCC)   Healthcare-associated pneumonia   COPD, severe ([place])   1. Acute on chronic respiratory failure with hypoxia we will continue with full support on assist control mode patient currently is on 50% FiO2.  Secretions still copious 2. Acute on chronic systolic heart failure we will continue to monitor the fluids closely.  Patient being seen by nephrology for fluid status 3. Acute renal failure started on  dialysis 4. Healthcare associated pneumonia treated with still with significant infiltrates could represent fluid rather than infection at this point 5. Severe COPD patient has been tapering slowly on steroids   I have personally seen and evaluated the patient, evaluated laboratory and imaging results, formulated the assessment and plan and placed orders. The Patient requires high complexity decision making for assessment and support.  Case was discussed on Rounds with the Respiratory Therapy Staff  Allyne Gee, MD [facility] Pulmonary Critical Care Medicine Sleep Medicine

## 2019-02-25 DIAGNOSIS — I5043 Acute on chronic combined systolic (congestive) and diastolic (congestive) heart failure: Secondary | ICD-10-CM | POA: Diagnosis not present

## 2019-02-25 DIAGNOSIS — N179 Acute kidney failure, unspecified: Secondary | ICD-10-CM | POA: Diagnosis not present

## 2019-02-25 DIAGNOSIS — J9621 Acute and chronic respiratory failure with hypoxia: Secondary | ICD-10-CM | POA: Diagnosis not present

## 2019-02-25 DIAGNOSIS — J449 Chronic obstructive pulmonary disease, unspecified: Secondary | ICD-10-CM | POA: Diagnosis not present

## 2019-02-25 LAB — CBC
HCT: 22.1 % — ABNORMAL LOW (ref 39.0–52.0)
Hemoglobin: 7.1 g/dL — ABNORMAL LOW (ref 13.0–17.0)
MCH: 27.8 pg (ref 26.0–34.0)
MCHC: 32.1 g/dL (ref 30.0–36.0)
MCV: 86.7 fL (ref 80.0–100.0)
Platelets: 181 10*3/uL (ref 150–400)
RBC: 2.55 MIL/uL — ABNORMAL LOW (ref 4.22–5.81)
RDW: 14.8 % (ref 11.5–15.5)
WBC: 6.5 10*3/uL (ref 4.0–10.5)
nRBC: 0 % (ref 0.0–0.2)

## 2019-02-25 LAB — RENAL FUNCTION PANEL
Albumin: 1.7 g/dL — ABNORMAL LOW (ref 3.5–5.0)
Anion gap: 13 (ref 5–15)
BUN: 97 mg/dL — ABNORMAL HIGH (ref 8–23)
CO2: 24 mmol/L (ref 22–32)
Calcium: 8.6 mg/dL — ABNORMAL LOW (ref 8.9–10.3)
Chloride: 101 mmol/L (ref 98–111)
Creatinine, Ser: 4.9 mg/dL — ABNORMAL HIGH (ref 0.61–1.24)
GFR calc Af Amer: 13 mL/min — ABNORMAL LOW (ref >60)
GFR calc non Af Amer: 11 mL/min — ABNORMAL LOW (ref >60)
Glucose, Bld: 91 mg/dL (ref 70–99)
Phosphorus: 5.3 mg/dL — ABNORMAL HIGH (ref 2.5–4.6)
Potassium: 4.1 mmol/L (ref 3.5–5.1)
Sodium: 138 mmol/L (ref 135–145)

## 2019-02-25 NOTE — Progress Notes (Addendum)
Pulmonary Critical Care Medicine [place]   PULMONARY CRITICAL CARE SERVICE  PROGRESS NOTE  Date of Service: 02/25/2019  Paul Ball  [name]  DOB: [birth year]   DOA: 02/15/2019  Referring Physician: [name], MD  HPI: Paul Ball is a 73 y.o. male seen for follow up of Acute on Chronic Respiratory Failure.  Patient on the ventilator and full support right now S attempted weaning did not tolerate will have respiratory therapy reassess  Medications: Reviewed on Rounds  Physical Exam:  Vitals: Temperature 97.8 pulse 72 respiratory rate 23 blood pressure 126/71 saturations 94%  Ventilator Settings mode ventilation assist control FiO2 35% tidal volume 405 PEEP 7  . General: Comfortable at this time . Eyes: Grossly normal lids, irises & conjunctiva . ENT: grossly tongue is normal . Neck: no obvious mass . Cardiovascular: S1 S2 normal no gallop . Respiratory: No rhonchi coarse breath sounds . Abdomen: soft . Skin: no rash seen on limited exam . Musculoskeletal: not rigid . Psychiatric:unable to assess . Neurologic: no seizure no involuntary movements         Lab Data:   Basic Metabolic Panel: Recent Labs  Lab 02/19/19 0724  02/20/19 0706 02/21/19 0728 02/22/19 1259 02/23/19 0554 02/25/19 0624  NA 142  --  144 140 139 139 138  K 4.8  --  4.8 4.5 4.6 4.5 4.1  CL 103  --  105 104 102 102 101  CO2 26  --  26 21* 25 23 24   GLUCOSE 165*  --  128* 163* 113* 127* 91  BUN 176*  --  175* 148* 136* 141* 97*  CREATININE 5.61*  --  5.92* 5.47* 5.36* 5.87* 4.90*  CALCIUM 9.3   < > 9.3 8.5* 8.8* 8.8* 8.6*  MG 3.6*  --  3.5* 3.0* 3.2*  --   --   PHOS 6.4*  --  6.6* 6.3*  --  6.8* 5.3*   < > = values in this interval not displayed.    ABG: No results for input(s): PHART, PCO2ART, PO2ART, HCO3, O2SAT in the last 168 hours.  Liver Function Tests: Recent Labs  Lab 02/19/19 0724 02/20/19 0706 02/21/19 0728 02/23/19 0554 02/25/19 0624   ALBUMIN 1.8* 1.7* 1.7* 1.7* 1.7*   No results for input(s): LIPASE, AMYLASE in the last 168 hours. No results for input(s): AMMONIA in the last 168 hours.  CBC: Recent Labs  Lab 02/20/19 0706 02/21/19 0728 02/22/19 0525 02/23/19 0554 02/25/19 0624  WBC 7.9 7.0 6.6 5.7 6.5  HGB 8.6* 7.5* 7.8* 7.2* 7.1*  HCT 26.4* 24.3* 25.1* 22.4* 22.1*  MCV 86.6 90.0 89.0 86.8 86.7  PLT 171 181 166 168 181    Cardiac Enzymes: No results for input(s): CKTOTAL, CKMB, CKMBINDEX, TROPONINI in the last 168 hours.  BNP (last 3 results) No results for input(s): BNP in the last 8760 hours.  ProBNP (last 3 results) No results for input(s): PROBNP in the last 8760 hours.  Radiological Exams: Dg Chest Port 1 View  Result Date: 02/24/2019 CLINICAL DATA:  Pneumonia. EXAM: PORTABLE CHEST 1 VIEW COMPARISON:  February 21, 2019. FINDINGS: Stable cardiomediastinal silhouette. Tracheostomy tube is unchanged position. Right internal jugular catheter is unchanged in position. No pneumothorax is noted. Stable bilateral lung opacities are noted concerning for edema or pneumonia with associated pleural effusions. Bony thorax is unremarkable. IMPRESSION: Stable support apparatus. Stable bilateral lung opacities and pleural effusions as described above. Electronically Signed   By: Bobbe Medico.D.  On: 02/24/2019 07:41    Assessment/Plan Active Problems:   Acute on chronic respiratory failure with hypoxia (HCC)   Acute on chronic systolic and diastolic heart failure, NYHA class 3 (HCC)   Acute renal failure superimposed on stage 3 chronic kidney disease (HCC)   Healthcare-associated pneumonia   COPD, severe ([place])   1. Acute on chronic respiratory failure hypoxia plan is to continue with full support on the ventilator patient was attempted at weaning today however did not tolerate it. 2. Acute on chronic systolic heart failure we will continue with present management 3. Healthcare associated pneumonia still  has significant infiltrates noted on the last chest x-ray 4. Acute renal failure followed by nephrology for dialysis 5. Severe COPD continue with nebulizer as needed   I have personally seen and evaluated the patient, evaluated laboratory and imaging results, formulated the assessment and plan and placed orders. The Patient requires high complexity decision making for assessment and support.  Case was discussed on Rounds with the Respiratory Therapy Staff  Allyne Gee, MD [facility] Pulmonary Critical Care Medicine Sleep Medicine

## 2019-02-25 NOTE — Progress Notes (Signed)
[facility]  ROUNDING NOTE   Subjective:  Patient due for hemodialysis again later today. Remains critically ill. Azotemia has improved.     Objective:  Vital signs in last 24 hours:  Temperature 97.8 pulse 72 respirations 23 blood pressure 126/71  Physical Exam: General: Critically ill-appearing  Head: Normocephalic, atraumatic. Moist oral mucosal membranes  Eyes: Anicteric  Neck: Tracheostomy in place  Lungs:  Scattered rhonchi, vent assisted  Heart: S1S2 no rubs  Abdomen:  Soft, nontender, bowel sounds present, PEG in place  Extremities: 2+ peripheral edema.  Neurologic: Not following commands  Skin: No rash  Access: Right internal jugular temporary dialysis catheter    Basic Metabolic Panel: Recent Labs  Lab 02/19/19 0724  02/20/19 0706 02/21/19 0728 02/22/19 1259 02/23/19 0554 02/25/19 0624  NA 142  --  144 140 139 139 138  K 4.8  --  4.8 4.5 4.6 4.5 4.1  CL 103  --  105 104 102 102 101  CO2 26  --  26 21* _0 GLUCOSE 165*  --  128* 163* 113* 127* 91  BUN 176*  --  175* 148* 136* 141* 97*  CREATININE 5.61*  --  5.92* 5.47* 5.36* 5.87* 4.90*  CALCIUM 9.3   < > 9.3 8.5* 8.8* 8.8* 8.6*  MG 3.6*  --  3.5* 3.0* 3.2*  --   --   PHOS 6.4*  --  6.6* 6.3*  --  6.8* 5.3*   < > = values in this interval not displayed.    Liver Function Tests: Recent Labs  Lab 02/19/19 0724 02/20/19 0706 02/21/19 0728 02/23/19 0554 02/25/19 0624  ALBUMIN 1.8* 1.7* 1.7* 1.7* 1.7*   No results for input(s): LIPASE, AMYLASE in the last 168 hours. No results for input(s): AMMONIA in the last 168 hours.  CBC: Recent Labs  Lab 02/20/19 0706 02/21/19 0728 02/22/19 0525 02/23/19 0554 02/25/19 0624  WBC 7.9 7.0 6.6 5.7 6.5  HGB 8.6* 7.5* 7.8* 7.2* 7.1*  HCT 26.4* 24.3* 25.1* 22.4* 22.1*  MCV 86.6 90.0 89.0 86.8 86.7  PLT 171 181 166 168 181    Cardiac Enzymes: No results for input(s): CKTOTAL, CKMB, CKMBINDEX, TROPONINI in the last 168  hours.  BNP: Invalid input(s): POCBNP  CBG: No results for input(s): GLUCAP in the last 168 hours.  Microbiology: Results for orders placed or performed during the hospital encounter of 02/15/19  Culture, Urine     Status: None   Collection Time: 02/15/19 12:52 AM   Specimen: Urine, Random  Result Value Ref Range Status   Specimen Description URINE, RANDOM  Final   Special Requests NONE  Final   Culture   Final    NO GROWTH Performed at [facility], [place] [address], [place], [place] [address]    Report Status 02/17/2019 FINAL  Final  Culture, respiratory (non-expectorated)     Status: None   Collection Time: 02/15/19 10:50 PM   Specimen: Tracheal Aspirate; Respiratory  Result Value Ref Range Status   Specimen Description TRACHEAL ASPIRATE  Final   Special Requests NONE  Final   Gram Stain   Final    NO WBC SEEN RARE GRAM POSITIVE COCCI IN PAIRS Performed at [facility], [address], [place], [place] [address]    Culture MODERATE ENTEROCOCCUS FAECALIS  Final   Report Status 02/19/2019 FINAL  Final   Organism ID, Bacteria ENTEROCOCCUS FAECALIS  Final      Susceptibility   Enterococcus  faecalis - MIC*    AMPICILLIN <=2 SENSITIVE Sensitive     VANCOMYCIN 1 SENSITIVE Sensitive     GENTAMICIN SYNERGY RESISTANT Resistant     * MODERATE ENTEROCOCCUS FAECALIS  SARS Coronavirus 2 by RT PCR (hospital order, performed in [facility] hospital lab) Nasopharyngeal Nasopharyngeal Swab     Status: None   Collection Time: 02/16/19  2:20 PM   Specimen: Nasopharyngeal Swab  Result Value Ref Range Status   SARS Coronavirus 2 NEGATIVE NEGATIVE Final    Comment: (NOTE) If result is NEGATIVE SARS-CoV-2 target nucleic acids are NOT DETECTED. The SARS-CoV-2 RNA is generally detectable in upper and lower  respiratory specimens during the acute phase of infection. The lowest  concentration of SARS-CoV-2 viral copies this assay can detect is 250  copies / mL. A negative result  does not preclude SARS-CoV-2 infection  and should not be used as the sole basis for treatment or other  patient management decisions.  A negative result may occur with  improper specimen collection / handling, submission of specimen other  than nasopharyngeal swab, presence of viral mutation(s) within the  areas targeted by this assay, and inadequate number of viral copies  (<250 copies / mL). A negative result must be combined with clinical  observations, patient history, and epidemiological information. If result is POSITIVE SARS-CoV-2 target nucleic acids are DETECTED. The SARS-CoV-2 RNA is generally detectable in upper and lower  respiratory specimens dur ing the acute phase of infection.  Positive  results are indicative of active infection with SARS-CoV-2.  Clinical  correlation with patient history and other diagnostic information is  necessary to determine patient infection status.  Positive results do  not rule out bacterial infection or co-infection with other viruses. If result is PRESUMPTIVE POSTIVE SARS-CoV-2 nucleic acids MAY BE PRESENT.   A presumptive positive result was obtained on the submitted specimen  and confirmed on repeat testing.  While 2019 novel coronavirus  (SARS-CoV-2) nucleic acids may be present in the submitted sample  additional confirmatory testing may be necessary for epidemiological  and / or clinical management purposes  to differentiate between  SARS-CoV-2 and other Sarbecovirus currently known to infect humans.  If clinically indicated additional testing with an alternate test  methodology [phone]) is advised. The SARS-CoV-2 RNA is generally  detectable in upper and lower respiratory sp ecimens during the acute  phase of infection. The expected result is Negative. Fact Sheet for Patients:  StrictlyIdeas.no Fact Sheet for Healthcare Providers: BankingDealers.co.za This test is not yet approved or  cleared by the [place] FDA and has been authorized for detection and/or diagnosis of SARS-CoV-2 by FDA under an Emergency Use Authorization (EUA).  This EUA will remain in effect (meaning this test can be used) for the duration of the COVID-19 declaration under Section 564(b)(1) of the Act, 21 U.S.C. section 360bbb-3(b)(1), unless the authorization is terminated or revoked sooner. Performed at [facility], [place] [address] [place], [place] [address]     Coagulation Studies: No results for input(s): LABPROT, INR in the last 72 hours.  Urinalysis: No results for input(s): COLORURINE, LABSPEC, PHURINE, GLUCOSEU, HGBUR, BILIRUBINUR, KETONESUR, PROTEINUR, UROBILINOGEN, NITRITE, LEUKOCYTESUR in the last 72 hours.  Invalid input(s): APPERANCEUR    Imaging: Dg Chest Port 1 View  Result Date: 02/24/2019 CLINICAL DATA:  Pneumonia. EXAM: PORTABLE CHEST 1 VIEW COMPARISON:  February 21, 2019. FINDINGS: Stable cardiomediastinal silhouette. Tracheostomy tube is unchanged position. Right internal jugular catheter is unchanged in position. No pneumothorax is  noted. Stable bilateral lung opacities are noted concerning for edema or pneumonia with associated pleural effusions. Bony thorax is unremarkable. IMPRESSION: Stable support apparatus. Stable bilateral lung opacities and pleural effusions as described above. Electronically Signed   By: Marijo Conception M.D.   On: 02/24/2019 07:41     Medications:     heparin, iohexol, lidocaine  Assessment/ Plan:  73 y.o. male with a PMHx of BPH, allergic rhinitis, COPD, diabetes mellitus type 2, hypertension, GERD, acute respiratory failure, congestive heart failure, chronic kidney disease stage IIIb EGFR 43 who was admitted to [facility] on 02/15/2019 for ongoing management of acute respiratory failure.   1.  Acute kidney injury/chronic kidney disease stage IIIb baseline GFR 43.    Patient seen and evaluated during rounds today.  Azotemia has  improved.  We plan for another dialysis session today.  2.  Acute respiratory failure.  Continue current ventilatory support.  Hopefully can be weaned with ongoing ultrafiltration.  3.  Anemia of chronic kidney disease.  Hemoglobin down a bit to 7.1.  Consider blood transfusion for hemoglobin of 7 or less but defer to primary team.  4.  Secondary hyperparathyroidism.  Phosphorus now down to 5.3.  This should continue to come down.   LOS: 0 Trinidad Ingle 10/23/20208:25 AM

## 2019-02-25 NOTE — Consult Note (Signed)
Ref: System, Pcp Not In   Subjective:  Episodes of bradycardia. Off amlodipine use.  Objective:  Vital Signs in the last 24 hours:    Physical Exam: BP Readings from Last 1 Encounters:  No data found for BP     Wt Readings from Last 1 Encounters:  No data found for Wt    Weight change:  There is no height or weight on file to calculate BMI. HEENT: [place]/AT, Eyes-Brown, Conjunctiva-Pale, Sclera-Non-icteric Neck: No JVD, No bruit, Trachea midline. Trach collar. Lungs:  Clear, Bilateral. Cardiac:  Irregular rhythm, normal S1 and S2, no S3. II/VI systolic murmur. Abdomen:  Soft, non-tender. BS present. Extremities:  No edema present. No cyanosis. No clubbing. Cooler lower extremites. CNS: AxOx0.  Skin: dry.   Intake/Output from previous day: No intake/output data recorded.    Lab Results: BMET    Component Value Date/Time   NA 138 02/25/2019 0624   NA 139 02/23/2019 0554   NA 139 02/22/2019 1259   K 4.1 02/25/2019 0624   K 4.5 02/23/2019 0554   K 4.6 02/22/2019 1259   CL 101 02/25/2019 0624   CL 102 02/23/2019 0554   CL 102 02/22/2019 1259   CO2 24 02/25/2019 0624   CO2 23 02/23/2019 0554   CO2 25 02/22/2019 1259   GLUCOSE 91 02/25/2019 0624   GLUCOSE 127 (H) 02/23/2019 0554   GLUCOSE 113 (H) 02/22/2019 1259   BUN 97 (H) 02/25/2019 0624   BUN 141 (H) 02/23/2019 0554   BUN 136 (H) 02/22/2019 1259   CREATININE 4.90 (H) 02/25/2019 0624   CREATININE 5.87 (H) 02/23/2019 0554   CREATININE 5.36 (H) 02/22/2019 1259   CALCIUM 8.6 (L) 02/25/2019 0624   CALCIUM 8.8 (L) 02/23/2019 0554   CALCIUM 8.8 (L) 02/22/2019 1259   CALCIUM 9.0 02/19/2019 1455   GFRNONAA 11 (L) 02/25/2019 0624   GFRNONAA 9 (L) 02/23/2019 0554   GFRNONAA 10 (L) 02/22/2019 1259   GFRAA 13 (L) 02/25/2019 0624   GFRAA 10 (L) 02/23/2019 0554   GFRAA 11 (L) 02/22/2019 1259   CBC    Component Value Date/Time   WBC 6.5 02/25/2019 0624   RBC 2.55 (L) 02/25/2019 0624   HGB 7.1 (L) 02/25/2019 0624   HCT 22.1 (L) 02/25/2019 0624   PLT 181 02/25/2019 0624   MCV 86.7 02/25/2019 0624   MCH 27.8 02/25/2019 0624   MCHC 32.1 02/25/2019 0624   RDW 14.8 02/25/2019 0624   LYMPHSABS 0.7 02/16/2019 0658   MONOABS 0.5 02/16/2019 0658   EOSABS 0.0 02/16/2019 0658   BASOSABS 0.0 02/16/2019 0658   HEPATIC Function Panel Recent Labs    02/16/19 0658  PROT 6.0*   HEMOGLOBIN A1C No components found for: HGA1C,  MPG CARDIAC ENZYMES No results found for: CKTOTAL, CKMB, CKMBINDEX, TROPONINI BNP No results for input(s): PROBNP in the last 8760 hours. TSH Recent Labs    02/16/19 0658  TSH 2.629   CHOLESTEROL No results for input(s): CHOL in the last 8760 hours.  Scheduled Meds: Continuous Infusions: PRN Meds:.heparin, iohexol, lidocaine  Assessment/Plan: Sinus bradycardia Acute on chronic respiratory failure COPD PVD HTN Large bilateral pleural effusions CKD, V Type 2 Dm S/P stroke  DC carvedilol. Decrease amiodarone to 100 mg. daily.   LOS: 0 days   Time spent including chart review, lab review, examination, discussion with patient's doctor, nurse : 30 min   Dixie Dials  MD  02/25/2019, 1:48 PM

## 2019-02-26 DIAGNOSIS — N179 Acute kidney failure, unspecified: Secondary | ICD-10-CM | POA: Diagnosis not present

## 2019-02-26 DIAGNOSIS — I5043 Acute on chronic combined systolic (congestive) and diastolic (congestive) heart failure: Secondary | ICD-10-CM | POA: Diagnosis not present

## 2019-02-26 DIAGNOSIS — J449 Chronic obstructive pulmonary disease, unspecified: Secondary | ICD-10-CM | POA: Diagnosis not present

## 2019-02-26 DIAGNOSIS — J9621 Acute and chronic respiratory failure with hypoxia: Secondary | ICD-10-CM | POA: Diagnosis not present

## 2019-02-26 LAB — OCCULT BLOOD X 1 CARD TO LAB, STOOL: Fecal Occult Bld: POSITIVE — AB

## 2019-02-26 LAB — CBC
HCT: 21.4 % — ABNORMAL LOW (ref 39.0–52.0)
Hemoglobin: 7.2 g/dL — ABNORMAL LOW (ref 13.0–17.0)
MCH: 27.8 pg (ref 26.0–34.0)
MCHC: 33.6 g/dL (ref 30.0–36.0)
MCV: 82.6 fL (ref 80.0–100.0)
Platelets: 205 10*3/uL (ref 150–400)
RBC: 2.59 MIL/uL — ABNORMAL LOW (ref 4.22–5.81)
RDW: 14.7 % (ref 11.5–15.5)
WBC: 8 10*3/uL (ref 4.0–10.5)
nRBC: 0 % (ref 0.0–0.2)

## 2019-02-26 NOTE — Progress Notes (Signed)
Pulmonary Critical Care Medicine [place]   PULMONARY CRITICAL CARE SERVICE  PROGRESS NOTE  Date of Service: 02/26/2019  Paul Ball  [id number]  DOB: [birth year]   DOA: 02/15/2019  Referring Physician: [name], MD  HPI: Paul Ball is a 73 y.o. male seen for follow up of Acute on Chronic Respiratory Failure.  Patient currently is on assist control mode on 45% FiO2 full support.  Attempted on RSB I however patient did not tolerate and failed  Medications: Reviewed on Rounds  Physical Exam:  Vitals: Temperature 98.2 pulse 73 respiratory 21 blood pressures 130/70 saturations 92%  Ventilator Settings mode ventilation assist control FiO2 45% tidal volume 328 PEEP 7  . General: Comfortable at this time . Eyes: Grossly normal lids, irises & conjunctiva . ENT: grossly tongue is normal . Neck: no obvious mass . Cardiovascular: S1 S2 normal no gallop . Respiratory: No rhonchi no rales are noted at this time . Abdomen: soft . Skin: no rash seen on limited exam . Musculoskeletal: not rigid . Psychiatric:unable to assess . Neurologic: no seizure no involuntary movements         Lab Data:   Basic Metabolic Panel: Recent Labs  Lab 02/20/19 0706 02/21/19 0728 02/22/19 1259 02/23/19 0554 02/25/19 0624  NA 144 140 139 139 138  K 4.8 4.5 4.6 4.5 4.1  CL 105 104 102 102 101  CO2 26 21* 25 23 24   GLUCOSE 128* 163* 113* 127* 91  BUN 175* 148* 136* 141* 97*  CREATININE 5.92* 5.47* 5.36* 5.87* 4.90*  CALCIUM 9.3 8.5* 8.8* 8.8* 8.6*  MG 3.5* 3.0* 3.2*  --   --   PHOS 6.6* 6.3*  --  6.8* 5.3*    ABG: No results for input(s): PHART, PCO2ART, PO2ART, HCO3, O2SAT in the last 168 hours.  Liver Function Tests: Recent Labs  Lab 02/20/19 0706 02/21/19 0728 02/23/19 0554 02/25/19 0624  ALBUMIN 1.7* 1.7* 1.7* 1.7*   No results for input(s): LIPASE, AMYLASE in the last 168 hours. No results for input(s): AMMONIA in the last 168  hours.  CBC: Recent Labs  Lab 02/21/19 0728 02/22/19 0525 02/23/19 0554 02/25/19 0624 02/26/19 1139  WBC 7.0 6.6 5.7 6.5 8.0  HGB 7.5* 7.8* 7.2* 7.1* 7.2*  HCT 24.3* 25.1* 22.4* 22.1* 21.4*  MCV 90.0 89.0 86.8 86.7 82.6  PLT 181 166 168 181 205    Cardiac Enzymes: No results for input(s): CKTOTAL, CKMB, CKMBINDEX, TROPONINI in the last 168 hours.  BNP (last 3 results) No results for input(s): BNP in the last 8760 hours.  ProBNP (last 3 results) No results for input(s): PROBNP in the last 8760 hours.  Radiological Exams: No results found.  Assessment/Plan Active Problems:   Acute on chronic respiratory failure with hypoxia (HCC)   Acute on chronic systolic and diastolic heart failure, NYHA class 3 (HCC)   Acute renal failure superimposed on stage 3 chronic kidney disease (HCC)   Healthcare-associated pneumonia   COPD, severe ([place])   1. Acute on chronic respiratory failure with hypoxia we will continue with full support on the ventilator currently on assist control mode patient was attempted at weaning however did not tolerate 2. Acute on chronic systolic and diastolic heart failure followed by cardiology supportive care 3. Acute renal failure followed by nephrology 4. Healthcare associated pneumonia treated 5. Severe COPD at baseline continue present management   I have personally seen and evaluated the patient, evaluated laboratory and imaging results, formulated the assessment and plan  and placed orders. The Patient requires high complexity decision making for assessment and support.  Case was discussed on Rounds with the Respiratory Therapy Staff  Allyne Gee, MD [facility] Pulmonary Critical Care Medicine Sleep Medicine

## 2019-02-27 DIAGNOSIS — I5043 Acute on chronic combined systolic (congestive) and diastolic (congestive) heart failure: Secondary | ICD-10-CM | POA: Diagnosis not present

## 2019-02-27 DIAGNOSIS — J449 Chronic obstructive pulmonary disease, unspecified: Secondary | ICD-10-CM | POA: Diagnosis not present

## 2019-02-27 DIAGNOSIS — N179 Acute kidney failure, unspecified: Secondary | ICD-10-CM | POA: Diagnosis not present

## 2019-02-27 DIAGNOSIS — J9621 Acute and chronic respiratory failure with hypoxia: Secondary | ICD-10-CM | POA: Diagnosis not present

## 2019-02-27 LAB — BASIC METABOLIC PANEL
Anion gap: 15 (ref 5–15)
BUN: 83 mg/dL — ABNORMAL HIGH (ref 8–23)
CO2: 23 mmol/L (ref 22–32)
Calcium: 8.4 mg/dL — ABNORMAL LOW (ref 8.9–10.3)
Chloride: 100 mmol/L (ref 98–111)
Creatinine, Ser: 4.74 mg/dL — ABNORMAL HIGH (ref 0.61–1.24)
GFR calc Af Amer: 13 mL/min — ABNORMAL LOW (ref >60)
GFR calc non Af Amer: 11 mL/min — ABNORMAL LOW (ref >60)
Glucose, Bld: 145 mg/dL — ABNORMAL HIGH (ref 70–99)
Potassium: 4 mmol/L (ref 3.5–5.1)
Sodium: 138 mmol/L (ref 135–145)

## 2019-02-27 NOTE — Progress Notes (Addendum)
Pulmonary Critical Care Medicine [place]   PULMONARY CRITICAL CARE SERVICE  PROGRESS NOTE  Date of Service: 02/27/2019  Paul Ball  [id number]  DOB: 1945/10/04   DOA: 02/15/2019  Referring Physician: [name], MD  HPI: Paul Ball is a 73 y.o. male seen for follow up of Acute on Chronic Respiratory Failure.  Patient currently is on full support on assist control mode not tolerating the RSB I this morning  Medications: Reviewed on Rounds  Physical Exam:  Vitals: Temperature 96.9 pulse 79 respiratory rate 21 blood pressure 132/84 saturations 97%  Ventilator Settings mode ventilation assist control FiO2 45% tidal volume 416 PEEP 7  . General: Comfortable at this time . Eyes: Grossly normal lids, irises & conjunctiva . ENT: grossly tongue is normal . Neck: no obvious mass . Cardiovascular: S1 S2 normal no gallop . Respiratory: No rhonchi no rales are noted at this time . Abdomen: soft . Skin: no rash seen on limited exam . Musculoskeletal: not rigid . Psychiatric:unable to assess . Neurologic: no seizure no involuntary movements         Lab Data:   Basic Metabolic Panel: Recent Labs  Lab 02/21/19 0728 02/22/19 1259 02/23/19 0554 02/25/19 0624  NA 140 139 139 138  K 4.5 4.6 4.5 4.1  CL 104 102 102 101  CO2 21* 25 23 24   GLUCOSE 163* 113* 127* 91  BUN 148* 136* 141* 97*  CREATININE 5.47* 5.36* 5.87* 4.90*  CALCIUM 8.5* 8.8* 8.8* 8.6*  MG 3.0* 3.2*  --   --   PHOS 6.3*  --  6.8* 5.3*    ABG: No results for input(s): PHART, PCO2ART, PO2ART, HCO3, O2SAT in the last 168 hours.  Liver Function Tests: Recent Labs  Lab 02/21/19 0728 02/23/19 0554 02/25/19 0624  ALBUMIN 1.7* 1.7* 1.7*   No results for input(s): LIPASE, AMYLASE in the last 168 hours. No results for input(s): AMMONIA in the last 168 hours.  CBC: Recent Labs  Lab 02/21/19 0728 02/22/19 0525 02/23/19 0554 02/25/19 0624 02/26/19 1139  WBC 7.0 6.6 5.7 6.5  8.0  HGB 7.5* 7.8* 7.2* 7.1* 7.2*  HCT 24.3* 25.1* 22.4* 22.1* 21.4*  MCV 90.0 89.0 86.8 86.7 82.6  PLT 181 166 168 181 205    Cardiac Enzymes: No results for input(s): CKTOTAL, CKMB, CKMBINDEX, TROPONINI in the last 168 hours.  BNP (last 3 results) No results for input(s): BNP in the last 8760 hours.  ProBNP (last 3 results) No results for input(s): PROBNP in the last 8760 hours.  Radiological Exams: No results found.  Assessment/Plan Active Problems:   Acute on chronic respiratory failure with hypoxia (HCC)   Acute on chronic systolic and diastolic heart failure, NYHA class 3 (HCC)   Acute renal failure superimposed on stage 3 chronic kidney disease (HCC)   Healthcare-associated pneumonia   COPD, severe ([place])   1. Acute on chronic respiratory failure with hypoxia continue with full support on assist control mode titrate oxygen down as tolerated patient is also on a PEEP of 7 try to titrate this down 2. Acute on chronic systolic heart failure continue with supportive care monitor fluid status and cardiology recommendations 3. Acute renal failure followed by nephrology will continue with supportive care 4. -Healthcare associated pneumonia treated 5. Severe COPD at baseline   I have personally seen and evaluated the patient, evaluated laboratory and imaging results, formulated the assessment and plan and placed orders. The Patient requires high complexity decision making for assessment and  support.  Case was discussed on Rounds with the Respiratory Therapy Staff  Allyne Gee, MD [facility] Pulmonary Critical Care Medicine Sleep Medicine

## 2019-02-28 DIAGNOSIS — I5043 Acute on chronic combined systolic (congestive) and diastolic (congestive) heart failure: Secondary | ICD-10-CM | POA: Diagnosis not present

## 2019-02-28 DIAGNOSIS — J9621 Acute and chronic respiratory failure with hypoxia: Secondary | ICD-10-CM | POA: Diagnosis not present

## 2019-02-28 DIAGNOSIS — J449 Chronic obstructive pulmonary disease, unspecified: Secondary | ICD-10-CM | POA: Diagnosis not present

## 2019-02-28 DIAGNOSIS — N179 Acute kidney failure, unspecified: Secondary | ICD-10-CM | POA: Diagnosis not present

## 2019-02-28 LAB — MAGNESIUM: Magnesium: 2.7 mg/dL — ABNORMAL HIGH (ref 1.7–2.4)

## 2019-02-28 LAB — CBC
HCT: 17.3 % — ABNORMAL LOW (ref 39.0–52.0)
HCT: 18.4 % — ABNORMAL LOW (ref 39.0–52.0)
Hemoglobin: 5.6 g/dL — CL (ref 13.0–17.0)
Hemoglobin: 5.9 g/dL — CL (ref 13.0–17.0)
MCH: 27.7 pg (ref 26.0–34.0)
MCH: 27.8 pg (ref 26.0–34.0)
MCHC: 32.4 g/dL (ref 30.0–36.0)
MCHC: 32.1 g/dL (ref 30.0–36.0)
MCV: 85.6 fL (ref 80.0–100.0)
MCV: 86.8 fL (ref 80.0–100.0)
Platelets: 209 10*3/uL (ref 150–400)
Platelets: 221 10*3/uL (ref 150–400)
RBC: 2.02 MIL/uL — ABNORMAL LOW (ref 4.22–5.81)
RBC: 2.12 MIL/uL — ABNORMAL LOW (ref 4.22–5.81)
RDW: 15.1 % (ref 11.5–15.5)
RDW: 15.3 % (ref 11.5–15.5)
WBC: 6.7 10*3/uL (ref 4.0–10.5)
WBC: 6.5 10*3/uL (ref 4.0–10.5)
nRBC: 0.3 % — ABNORMAL HIGH (ref 0.0–0.2)
nRBC: 0.3 % — ABNORMAL HIGH (ref 0.0–0.2)

## 2019-02-28 LAB — BASIC METABOLIC PANEL
Anion gap: 13 (ref 5–15)
BUN: 93 mg/dL — ABNORMAL HIGH (ref 8–23)
CO2: 24 mmol/L (ref 22–32)
Calcium: 8.5 mg/dL — ABNORMAL LOW (ref 8.9–10.3)
Chloride: 101 mmol/L (ref 98–111)
Creatinine, Ser: 5.32 mg/dL — ABNORMAL HIGH (ref 0.61–1.24)
GFR calc Af Amer: 12 mL/min — ABNORMAL LOW (ref >60)
GFR calc non Af Amer: 10 mL/min — ABNORMAL LOW (ref >60)
Glucose, Bld: 88 mg/dL (ref 70–99)
Potassium: 4.8 mmol/L (ref 3.5–5.1)
Sodium: 138 mmol/L (ref 135–145)

## 2019-02-28 LAB — PREPARE RBC (CROSSMATCH)

## 2019-02-28 LAB — ABO/RH: ABO/RH(D): B POS

## 2019-02-28 LAB — PHOSPHORUS: Phosphorus: 4.6 mg/dL (ref 2.5–4.6)

## 2019-02-28 NOTE — Progress Notes (Signed)
Pulmonary Critical Care Medicine [place]   PULMONARY CRITICAL CARE SERVICE  PROGRESS NOTE  Date of Service: 02/28/2019  Paul Ball  [id number]  DOB: 09-28-1945   DOA: 02/15/2019  Referring Physician: [name], MD  HPI: Paul Ball is a 73 y.o. male seen for follow up of Acute on Chronic Respiratory Failure.  Patient currently is on pressure support mode for weaning the goal of up to 4 hours  Medications: Reviewed on Rounds  Physical Exam:  Vitals: Temperature 97.0 pulse 57 respiratory rate 20 blood pressure 153/62 saturations 100%  Ventilator Settings mode of ventilation pressure support FiO2 40% pressure support 12 PEEP 7  . General: Comfortable at this time . Eyes: Grossly normal lids, irises & conjunctiva . ENT: grossly tongue is normal . Neck: no obvious mass . Cardiovascular: S1 S2 normal no gallop . Respiratory: No rhonchi coarse breath sounds are noted . Abdomen: soft . Skin: no rash seen on limited exam . Musculoskeletal: not rigid . Psychiatric:unable to assess . Neurologic: no seizure no involuntary movements         Lab Data:   Basic Metabolic Panel: Recent Labs  Lab 02/22/19 1259 02/23/19 0554 02/25/19 0624 02/27/19 1430 02/28/19 1048  NA 139 139 138 138 138  K 4.6 4.5 4.1 4.0 4.8  CL 102 102 101 100 101  CO2 25 23 24 23 24   GLUCOSE 113* 127* 91 145* 88  BUN 136* 141* 97* 83* 93*  CREATININE 5.36* 5.87* 4.90* 4.74* 5.32*  CALCIUM 8.8* 8.8* 8.6* 8.4* 8.5*  MG 3.2*  --   --   --  2.7*  PHOS  --  6.8* 5.3*  --  4.6    ABG: No results for input(s): PHART, PCO2ART, PO2ART, HCO3, O2SAT in the last 168 hours.  Liver Function Tests: Recent Labs  Lab 02/23/19 0554 02/25/19 0624  ALBUMIN 1.7* 1.7*   No results for input(s): LIPASE, AMYLASE in the last 168 hours. No results for input(s): AMMONIA in the last 168 hours.  CBC: Recent Labs  Lab 02/22/19 0525 02/23/19 0554 02/25/19 0624 02/26/19 1139  02/28/19 1304  WBC 6.6 5.7 6.5 8.0 6.7  HGB 7.8* 7.2* 7.1* 7.2* 5.6*  HCT 25.1* 22.4* 22.1* 21.4* 17.3*  MCV 89.0 86.8 86.7 82.6 85.6  PLT 166 168 181 205 209    Cardiac Enzymes: No results for input(s): CKTOTAL, CKMB, CKMBINDEX, TROPONINI in the last 168 hours.  BNP (last 3 results) No results for input(s): BNP in the last 8760 hours.  ProBNP (last 3 results) No results for input(s): PROBNP in the last 8760 hours.  Radiological Exams: No results found.  Assessment/Plan Active Problems:   Acute on chronic respiratory failure with hypoxia (HCC)   Acute on chronic systolic and diastolic heart failure, NYHA class 3 (HCC)   Acute renal failure superimposed on stage 3 chronic kidney disease (HCC)   Healthcare-associated pneumonia   COPD, severe ([place])   1. Acute on chronic respiratory failure with hypoxia we will continue with pressure support mode on 40% FiO2 titrate oxygen continue pulmonary toilet 2. Acute on chronic diastolic heart failure we will continue with supportive care 3. Acute renal failure follow-up labs patient is being followed by nephrology for dialysis 4. Healthcare associated pneumonia treated 5. Severe COPD at baseline we will continue with present management   I have personally seen and evaluated the patient, evaluated laboratory and imaging results, formulated the assessment and plan and placed orders. The Patient requires high complexity decision  making for assessment and support.  Case was discussed on Rounds with the Respiratory Therapy Staff  Allyne Gee, MD [facility] Pulmonary Critical Care Medicine Sleep Medicine

## 2019-02-28 NOTE — Progress Notes (Signed)
[facility]  ROUNDING NOTE   Subjective:  Patient remains critically ill at the moment. Still on the ventilator. Due for dialysis later today.     Objective:  Vital signs in last 24 hours:  Temperature 97 pulse 81 respirations 20 blood pressure 160/78  Physical Exam: General: Critically ill-appearing  Head: Normocephalic, atraumatic. Moist oral mucosal membranes  Eyes: Anicteric  Neck: Tracheostomy in place  Lungs:  Scattered rhonchi, vent assisted  Heart: S1S2 no rubs  Abdomen:  Soft, nontender, bowel sounds present, PEG in place  Extremities: 2+ peripheral edema.  Neurologic: Not following commands  Skin: No rash  Access: Right internal jugular temporary dialysis catheter    Basic Metabolic Panel: Recent Labs  Lab 02/22/19 1259 02/23/19 0554 02/25/19 0624 02/27/19 1430  NA 139 139 138 138  K 4.6 4.5 4.1 4.0  CL 102 102 101 100  CO2 _0 GLUCOSE 113* 127* 91 145*  BUN 136* 141* 97* 83*  CREATININE 5.36* 5.87* 4.90* 4.74*  CALCIUM 8.8* 8.8* 8.6* 8.4*  MG 3.2*  --   --   --   PHOS  --  6.8* 5.3*  --     Liver Function Tests: Recent Labs  Lab 02/23/19 0554 02/25/19 0624  ALBUMIN 1.7* 1.7*   No results for input(s): LIPASE, AMYLASE in the last 168 hours. No results for input(s): AMMONIA in the last 168 hours.  CBC: Recent Labs  Lab 02/22/19 0525 02/23/19 0554 02/25/19 0624 02/26/19 1139  WBC 6.6 5.7 6.5 8.0  HGB 7.8* 7.2* 7.1* 7.2*  HCT 25.1* 22.4* 22.1* 21.4*  MCV 89.0 86.8 86.7 82.6  PLT 166 168 181 205    Cardiac Enzymes: No results for input(s): CKTOTAL, CKMB, CKMBINDEX, TROPONINI in the last 168 hours.  BNP: Invalid input(s): POCBNP  CBG: No results for input(s): GLUCAP in the last 168 hours.  Microbiology: Results for orders placed or performed during the hospital encounter of 02/15/19  Culture, Urine     Status: None   Collection Time: 02/15/19 12:52 AM   Specimen: Urine, Random  Result Value Ref Range  Status   Specimen Description URINE, RANDOM  Final   Special Requests NONE  Final   Culture   Final    NO GROWTH Performed at [facility], [place] [address], [place], [place] [address]    Report Status 02/17/2019 FINAL  Final  Culture, respiratory (non-expectorated)     Status: None   Collection Time: 02/15/19 10:50 PM   Specimen: Tracheal Aspirate; Respiratory  Result Value Ref Range Status   Specimen Description TRACHEAL ASPIRATE  Final   Special Requests NONE  Final   Gram Stain   Final    NO WBC SEEN RARE GRAM POSITIVE COCCI IN PAIRS Performed at [facility], [address] [place], [place] [address]    Culture MODERATE ENTEROCOCCUS FAECALIS  Final   Report Status 02/19/2019 FINAL  Final   Organism ID, Bacteria ENTEROCOCCUS FAECALIS  Final      Susceptibility   Enterococcus faecalis - MIC*    AMPICILLIN <=2 SENSITIVE Sensitive     VANCOMYCIN 1 SENSITIVE Sensitive     GENTAMICIN SYNERGY RESISTANT Resistant     * MODERATE ENTEROCOCCUS FAECALIS  SARS Coronavirus 2 by RT PCR (hospital order, performed in [place] hospital lab) Nasopharyngeal Nasopharyngeal Swab     Status: None   Collection Time: 02/16/19  2:20 PM   Specimen: Nasopharyngeal Swab  Result Value Ref Range  Status   SARS Coronavirus 2 NEGATIVE NEGATIVE Final    Comment: (NOTE) If result is NEGATIVE SARS-CoV-2 target nucleic acids are NOT DETECTED. The SARS-CoV-2 RNA is generally detectable in upper and lower  respiratory specimens during the acute phase of infection. The lowest  concentration of SARS-CoV-2 viral copies this assay can detect is 250  copies / mL. A negative result does not preclude SARS-CoV-2 infection  and should not be used as the sole basis for treatment or other  patient management decisions.  A negative result may occur with  improper specimen collection / handling, submission of specimen other  than nasopharyngeal swab, presence of viral mutation(s) within the  areas targeted  by this assay, and inadequate number of viral copies  (<250 copies / mL). A negative result must be combined with clinical  observations, patient history, and epidemiological information. If result is POSITIVE SARS-CoV-2 target nucleic acids are DETECTED. The SARS-CoV-2 RNA is generally detectable in upper and lower  respiratory specimens dur ing the acute phase of infection.  Positive  results are indicative of active infection with SARS-CoV-2.  Clinical  correlation with patient history and other diagnostic information is  necessary to determine patient infection status.  Positive results do  not rule out bacterial infection or co-infection with other viruses. If result is PRESUMPTIVE POSTIVE SARS-CoV-2 nucleic acids MAY BE PRESENT.   A presumptive positive result was obtained on the submitted specimen  and confirmed on repeat testing.  While 2019 novel coronavirus  (SARS-CoV-2) nucleic acids may be present in the submitted sample  additional confirmatory testing may be necessary for epidemiological  and / or clinical management purposes  to differentiate between  SARS-CoV-2 and other Sarbecovirus currently known to infect humans.  If clinically indicated additional testing with an alternate test  methodology [phone]) is advised. The SARS-CoV-2 RNA is generally  detectable in upper and lower respiratory sp ecimens during the acute  phase of infection. The expected result is Negative. Fact Sheet for Patients:  StrictlyIdeas.no Fact Sheet for Healthcare Providers: BankingDealers.co.za This test is not yet approved or cleared by the [place] FDA and has been authorized for detection and/or diagnosis of SARS-CoV-2 by FDA under an Emergency Use Authorization (EUA).  This EUA will remain in effect (meaning this test can be used) for the duration of the COVID-19 declaration under Section 564(b)(1) of the Act, 21 U.S.C. section  360bbb-3(b)(1), unless the authorization is terminated or revoked sooner. Performed at [facility], [place] [address] [place], [place] [address]     Coagulation Studies: No results for input(s): LABPROT, INR in the last 72 hours.  Urinalysis: No results for input(s): COLORURINE, LABSPEC, PHURINE, GLUCOSEU, HGBUR, BILIRUBINUR, KETONESUR, PROTEINUR, UROBILINOGEN, NITRITE, LEUKOCYTESUR in the last 72 hours.  Invalid input(s): APPERANCEUR    Imaging: No results found.   Medications:     heparin, iohexol, lidocaine  Assessment/ Plan:  73 y.o. male with a PMHx of BPH, allergic rhinitis, COPD, diabetes mellitus type 2, hypertension, GERD, acute respiratory failure, congestive heart failure, chronic kidney disease stage IIIb EGFR 43 who was admitted to [facility] on 02/15/2019 for ongoing management of acute respiratory failure.   1.  Acute kidney injury/chronic kidney disease stage IIIb baseline GFR 43.    Patient due for hemodialysis today.  Orders have been prepared.  Continue dialysis on MWF schedule.  2.  Acute respiratory failure.  Patient maintained on the ventilator.  FiO2 currently 40% with a PEEP of 7.  Weaning as per  pulmonary/critical care.  3.  Anemia of chronic kidney disease.  Hemoglobin currently 7.2.  As before consider transfusion for hemoglobin of 7 or less.  4.  Secondary hyperparathyroidism.  Repeat serum phosphorus today.   LOS: 0 Deija Buhrman 10/26/20208:52 AM

## 2019-03-01 DIAGNOSIS — J449 Chronic obstructive pulmonary disease, unspecified: Secondary | ICD-10-CM | POA: Diagnosis not present

## 2019-03-01 DIAGNOSIS — N179 Acute kidney failure, unspecified: Secondary | ICD-10-CM | POA: Diagnosis not present

## 2019-03-01 DIAGNOSIS — I5043 Acute on chronic combined systolic (congestive) and diastolic (congestive) heart failure: Secondary | ICD-10-CM | POA: Diagnosis not present

## 2019-03-01 DIAGNOSIS — J9621 Acute and chronic respiratory failure with hypoxia: Secondary | ICD-10-CM | POA: Diagnosis not present

## 2019-03-01 LAB — RENAL FUNCTION PANEL
Albumin: 1.6 g/dL — ABNORMAL LOW (ref 3.5–5.0)
Anion gap: 10 (ref 5–15)
BUN: 59 mg/dL — ABNORMAL HIGH (ref 8–23)
CO2: 26 mmol/L (ref 22–32)
Calcium: 8.2 mg/dL — ABNORMAL LOW (ref 8.9–10.3)
Chloride: 99 mmol/L (ref 98–111)
Creatinine, Ser: 3.78 mg/dL — ABNORMAL HIGH (ref 0.61–1.24)
GFR calc Af Amer: 17 mL/min — ABNORMAL LOW (ref >60)
GFR calc non Af Amer: 15 mL/min — ABNORMAL LOW (ref >60)
Glucose, Bld: 77 mg/dL (ref 70–99)
Phosphorus: 3.5 mg/dL (ref 2.5–4.6)
Potassium: 3.7 mmol/L (ref 3.5–5.1)
Sodium: 135 mmol/L (ref 135–145)

## 2019-03-01 LAB — CBC
HCT: 21.8 % — ABNORMAL LOW (ref 39.0–52.0)
Hemoglobin: 7.1 g/dL — ABNORMAL LOW (ref 13.0–17.0)
MCH: 28.1 pg (ref 26.0–34.0)
MCHC: 32.6 g/dL (ref 30.0–36.0)
MCV: 86.2 fL (ref 80.0–100.0)
Platelets: 207 10*3/uL (ref 150–400)
RBC: 2.53 MIL/uL — ABNORMAL LOW (ref 4.22–5.81)
RDW: 15 % (ref 11.5–15.5)
WBC: 8.7 10*3/uL (ref 4.0–10.5)
nRBC: 0 % (ref 0.0–0.2)

## 2019-03-01 LAB — TYPE AND SCREEN
ABO/RH(D): B POS
Antibody Screen: NEGATIVE
Unit division: 0

## 2019-03-01 LAB — BPAM RBC
Blood Product Expiration Date: 202011222359
ISSUE DATE / TIME: 202010261822
Unit Type and Rh: 7300

## 2019-03-01 LAB — OCCULT BLOOD X 1 CARD TO LAB, STOOL: Fecal Occult Bld: POSITIVE — AB

## 2019-03-01 NOTE — Progress Notes (Signed)
Pulmonary Critical Care Medicine [place]   PULMONARY CRITICAL CARE SERVICE  PROGRESS NOTE  Date of Service: 03/01/2019  Paul Ball  [id number]  DOB: Apr 23, 1946   DOA: 02/15/2019  Referring Physician: [name], MD  HPI: Paul Ball is a 73 y.o. male seen for follow up of Acute on Chronic Respiratory Failure.  Patient currently is on full support on assist control mode was attempted on pressure support but failed  Medications: Reviewed on Rounds  Physical Exam:  Vitals: Temperature 97.0 pulse 55 respiratory rate 22 blood pressure 144/61 saturations 99%  Ventilator Settings mode ventilation assist control FiO2 40% tidal volume 505 PEEP 7  . General: Comfortable at this time . Eyes: Grossly normal lids, irises & conjunctiva . ENT: grossly tongue is normal . Neck: no obvious mass . Cardiovascular: S1 S2 normal no gallop . Respiratory: No rhonchi coarse breath sounds are noted . Abdomen: soft . Skin: no rash seen on limited exam . Musculoskeletal: not rigid . Psychiatric:unable to assess . Neurologic: no seizure no involuntary movements         Lab Data:   Basic Metabolic Panel: Recent Labs  Lab 02/23/19 0554 02/25/19 0624 02/27/19 1430 02/28/19 1048 03/01/19 0530  NA 139 138 138 138 135  K 4.5 4.1 4.0 4.8 3.7  CL 102 101 100 101 99  CO2 23 24 23 24 26   GLUCOSE 127* 91 145* 88 77  BUN 141* 97* 83* 93* 59*  CREATININE 5.87* 4.90* 4.74* 5.32* 3.78*  CALCIUM 8.8* 8.6* 8.4* 8.5* 8.2*  MG  --   --   --  2.7*  --   PHOS 6.8* 5.3*  --  4.6 3.5    ABG: No results for input(s): PHART, PCO2ART, PO2ART, HCO3, O2SAT in the last 168 hours.  Liver Function Tests: Recent Labs  Lab 02/23/19 0554 02/25/19 0624 03/01/19 0530  ALBUMIN 1.7* 1.7* 1.6*   No results for input(s): LIPASE, AMYLASE in the last 168 hours. No results for input(s): AMMONIA in the last 168 hours.  CBC: Recent Labs  Lab 02/25/19 0624 02/26/19 1139  02/28/19 1304 02/28/19 2004 03/01/19 0530  WBC 6.5 8.0 6.7 6.5 8.7  HGB 7.1* 7.2* 5.6* 5.9* 7.1*  HCT 22.1* 21.4* 17.3* 18.4* 21.8*  MCV 86.7 82.6 85.6 86.8 86.2  PLT 181 205 209 221 207    Cardiac Enzymes: No results for input(s): CKTOTAL, CKMB, CKMBINDEX, TROPONINI in the last 168 hours.  BNP (last 3 results) No results for input(s): BNP in the last 8760 hours.  ProBNP (last 3 results) No results for input(s): PROBNP in the last 8760 hours.  Radiological Exams: No results found.  Assessment/Plan Active Problems:   Acute on chronic respiratory failure with hypoxia (HCC)   Acute on chronic systolic and diastolic heart failure, NYHA class 3 (HCC)   Acute renal failure superimposed on stage 3 chronic kidney disease (HCC)   Healthcare-associated pneumonia   COPD, severe ([place])   1. Acute on chronic respiratory failure with hypoxia continue with full support on the ventilator was attempted on pressure support did not tolerate we will reassess again tomorrow 2. Acute on chronic systolic heart failure right now compensated monitor fluid status 3. Acute renal failure followed by nephrology for dialysis 4. Healthcare associated pneumonia treated 5. Severe COPD patient is at baseline nebulizers as necessary   I have personally seen and evaluated the patient, evaluated laboratory and imaging results, formulated the assessment and plan and placed orders. The Patient requires high  complexity decision making for assessment and support.  Case was discussed on Rounds with the Respiratory Therapy Staff  Allyne Gee, MD [facility] Pulmonary Critical Care Medicine Sleep Medicine

## 2019-03-02 ENCOUNTER — Other Ambulatory Visit (HOSPITAL_COMMUNITY): Payer: Medicare Other

## 2019-03-02 DIAGNOSIS — I5043 Acute on chronic combined systolic (congestive) and diastolic (congestive) heart failure: Secondary | ICD-10-CM | POA: Diagnosis not present

## 2019-03-02 DIAGNOSIS — J9621 Acute and chronic respiratory failure with hypoxia: Secondary | ICD-10-CM | POA: Diagnosis not present

## 2019-03-02 DIAGNOSIS — N179 Acute kidney failure, unspecified: Secondary | ICD-10-CM | POA: Diagnosis not present

## 2019-03-02 DIAGNOSIS — J449 Chronic obstructive pulmonary disease, unspecified: Secondary | ICD-10-CM | POA: Diagnosis not present

## 2019-03-02 LAB — CBC
HCT: 22.7 % — ABNORMAL LOW (ref 39.0–52.0)
Hemoglobin: 7.4 g/dL — ABNORMAL LOW (ref 13.0–17.0)
MCH: 27.9 pg (ref 26.0–34.0)
MCHC: 32.6 g/dL (ref 30.0–36.0)
MCV: 85.7 fL (ref 80.0–100.0)
Platelets: 214 10*3/uL (ref 150–400)
RBC: 2.65 MIL/uL — ABNORMAL LOW (ref 4.22–5.81)
RDW: 15.4 % (ref 11.5–15.5)
WBC: 8.6 10*3/uL (ref 4.0–10.5)
nRBC: 0.2 % (ref 0.0–0.2)

## 2019-03-02 LAB — RENAL FUNCTION PANEL
Albumin: 1.5 g/dL — ABNORMAL LOW (ref 3.5–5.0)
Anion gap: 10 (ref 5–15)
BUN: 69 mg/dL — ABNORMAL HIGH (ref 8–23)
CO2: 26 mmol/L (ref 22–32)
Calcium: 8.3 mg/dL — ABNORMAL LOW (ref 8.9–10.3)
Chloride: 99 mmol/L (ref 98–111)
Creatinine, Ser: 4.24 mg/dL — ABNORMAL HIGH (ref 0.61–1.24)
GFR calc Af Amer: 15 mL/min — ABNORMAL LOW (ref >60)
GFR calc non Af Amer: 13 mL/min — ABNORMAL LOW (ref >60)
Glucose, Bld: 48 mg/dL — ABNORMAL LOW (ref 70–99)
Phosphorus: 3.9 mg/dL (ref 2.5–4.6)
Potassium: 3.7 mmol/L (ref 3.5–5.1)
Sodium: 135 mmol/L (ref 135–145)

## 2019-03-02 MED ORDER — GENERIC EXTERNAL MEDICATION
Status: DC
Start: ? — End: 2019-03-02

## 2019-03-02 NOTE — Progress Notes (Signed)
[facility]  ROUNDING NOTE   Subjective:  Patient remains on the ventilator at the moment. FiO2 currently 40% with a PEEP of 7. Remains critically ill overall. Due for dialysis treatment today.   Objective:  Vital signs in last 24 hours:  Temperature 98.1 pulse 97 respirations 28 blood pressure 122/58  Physical Exam: General: Critically ill-appearing  Head: Normocephalic, atraumatic. Moist oral mucosal membranes  Eyes: Anicteric  Neck: Tracheostomy in place  Lungs:  Scattered rhonchi, vent assisted  Heart: S1S2 no rubs  Abdomen:  Soft, nontender, bowel sounds present, PEG in place  Extremities: 2+ peripheral edema.  Neurologic: Not following commands  Skin: No rash  Access: Right internal jugular temporary dialysis catheter    Basic Metabolic Panel: Recent Labs  Lab 02/25/19 0624 02/27/19 1430 02/28/19 1048 03/01/19 0530 03/02/19 0654  NA 138 138 138 135 135  K 4.1 4.0 4.8 3.7 3.7  CL 101 100 101 99 99  CO2 _0 GLUCOSE 91 145* 88 77 48*  BUN 97* 83* 93* 59* 69*  CREATININE 4.90* 4.74* 5.32* 3.78* 4.24*  CALCIUM 8.6* 8.4* 8.5* 8.2* 8.3*  MG  --   --  2.7*  --   --   PHOS 5.3*  --  4.6 3.5 3.9    Liver Function Tests: Recent Labs  Lab 02/25/19 0624 03/01/19 0530 03/02/19 0654  ALBUMIN 1.7* 1.6* 1.5*   No results for input(s): LIPASE, AMYLASE in the last 168 hours. No results for input(s): AMMONIA in the last 168 hours.  CBC: Recent Labs  Lab 02/26/19 1139 02/28/19 1304 02/28/19 2004 03/01/19 0530 03/02/19 0654  WBC 8.0 6.7 6.5 8.7 8.6  HGB 7.2* 5.6* 5.9* 7.1* 7.4*  HCT 21.4* 17.3* 18.4* 21.8* 22.7*  MCV 82.6 85.6 86.8 86.2 85.7  PLT 205 209 221 207 214    Cardiac Enzymes: No results for input(s): CKTOTAL, CKMB, CKMBINDEX, TROPONINI in the last 168 hours.  BNP: Invalid input(s): POCBNP  CBG: No results for input(s): GLUCAP in the last 168 hours.  Microbiology: Results for orders placed or performed during the  hospital encounter of 02/15/19  Culture, Urine     Status: None   Collection Time: 02/15/19 12:52 AM   Specimen: Urine, Random  Result Value Ref Range Status   Specimen Description URINE, RANDOM  Final   Special Requests NONE  Final   Culture   Final    NO GROWTH Performed at [facility], [place] [address], [place], [place] [address]    Report Status 02/17/2019 FINAL  Final  Culture, respiratory (non-expectorated)     Status: None   Collection Time: 02/15/19 10:50 PM   Specimen: Tracheal Aspirate; Respiratory  Result Value Ref Range Status   Specimen Description TRACHEAL ASPIRATE  Final   Special Requests NONE  Final   Gram Stain   Final    NO WBC SEEN RARE GRAM POSITIVE COCCI IN PAIRS Performed at [facility], [address] [place], [place] [address]    Culture MODERATE ENTEROCOCCUS FAECALIS  Final   Report Status 02/19/2019 FINAL  Final   Organism ID, Bacteria ENTEROCOCCUS FAECALIS  Final      Susceptibility   Enterococcus faecalis - MIC*    AMPICILLIN <=2 SENSITIVE Sensitive     VANCOMYCIN 1 SENSITIVE Sensitive     GENTAMICIN SYNERGY RESISTANT Resistant     * MODERATE ENTEROCOCCUS FAECALIS  SARS Coronavirus 2 by RT PCR (hospital order, performed in [place]  hospital lab) Nasopharyngeal Nasopharyngeal Swab     Status: None   Collection Time: 02/16/19  2:20 PM   Specimen: Nasopharyngeal Swab  Result Value Ref Range Status   SARS Coronavirus 2 NEGATIVE NEGATIVE Final    Comment: (NOTE) If result is NEGATIVE SARS-CoV-2 target nucleic acids are NOT DETECTED. The SARS-CoV-2 RNA is generally detectable in upper and lower  respiratory specimens during the acute phase of infection. The lowest  concentration of SARS-CoV-2 viral copies this assay can detect is 250  copies / mL. A negative result does not preclude SARS-CoV-2 infection  and should not be used as the sole basis for treatment or other  patient management decisions.  A negative result may occur with   improper specimen collection / handling, submission of specimen other  than nasopharyngeal swab, presence of viral mutation(s) within the  areas targeted by this assay, and inadequate number of viral copies  (<250 copies / mL). A negative result must be combined with clinical  observations, patient history, and epidemiological information. If result is POSITIVE SARS-CoV-2 target nucleic acids are DETECTED. The SARS-CoV-2 RNA is generally detectable in upper and lower  respiratory specimens dur ing the acute phase of infection.  Positive  results are indicative of active infection with SARS-CoV-2.  Clinical  correlation with patient history and other diagnostic information is  necessary to determine patient infection status.  Positive results do  not rule out bacterial infection or co-infection with other viruses. If result is PRESUMPTIVE POSTIVE SARS-CoV-2 nucleic acids MAY BE PRESENT.   A presumptive positive result was obtained on the submitted specimen  and confirmed on repeat testing.  While 2019 novel coronavirus  (SARS-CoV-2) nucleic acids may be present in the submitted sample  additional confirmatory testing may be necessary for epidemiological  and / or clinical management purposes  to differentiate between  SARS-CoV-2 and other Sarbecovirus currently known to infect humans.  If clinically indicated additional testing with an alternate test  methodology [phone]) is advised. The SARS-CoV-2 RNA is generally  detectable in upper and lower respiratory sp ecimens during the acute  phase of infection. The expected result is Negative. Fact Sheet for Patients:  StrictlyIdeas.no Fact Sheet for Healthcare Providers: BankingDealers.co.za This test is not yet approved or cleared by the [place] FDA and has been authorized for detection and/or diagnosis of SARS-CoV-2 by FDA under an Emergency Use Authorization (EUA).  This EUA will  remain in effect (meaning this test can be used) for the duration of the COVID-19 declaration under Section 564(b)(1) of the Act, 21 U.S.C. section 360bbb-3(b)(1), unless the authorization is terminated or revoked sooner. Performed at [facility], [place] [address] [place], [place] [address]     Coagulation Studies: No results for input(s): LABPROT, INR in the last 72 hours.  Urinalysis: No results for input(s): COLORURINE, LABSPEC, PHURINE, GLUCOSEU, HGBUR, BILIRUBINUR, KETONESUR, PROTEINUR, UROBILINOGEN, NITRITE, LEUKOCYTESUR in the last 72 hours.  Invalid input(s): APPERANCEUR    Imaging: Dg Chest Port 1 View  Result Date: 03/02/2019 CLINICAL DATA:  Shortness of breath EXAM: PORTABLE CHEST 1 VIEW COMPARISON:  02/24/2019 FINDINGS: Diffuse bilateral airspace disease and moderate layering effusions, similar to prior study. Tracheostomy and right Vas-Cath unchanged. IMPRESSION: No significant change diffuse bilateral airspace disease and effusions. Electronically Signed   By: Rolm Baptise M.D.   On: 03/02/2019 08:54     Medications:     heparin, iohexol, lidocaine  Assessment/ Plan:  73 y.o. male with a PMHx of BPH, allergic  rhinitis, COPD, diabetes mellitus type 2, hypertension, GERD, acute respiratory failure, congestive heart failure, chronic kidney disease stage IIIb EGFR 43 who was admitted to [facility] on 02/15/2019 for ongoing management of acute respiratory failure.   1.  Acute kidney injury/chronic kidney disease stage IIIb baseline GFR 43.    Overall patient remains critically ill.  He remains dialysis dependent at this time.  We will plan for hemodialysis treatment today.  2.  Acute respiratory failure.  Patient has been difficult to wean from the ventilator.  His FiO2 remains stable at 40% with a PEEP of 7.  Pulmonary/critical care following.  3.  Anemia of chronic kidney disease.  Hemoglobin slightly higher at 7.4.  No urgent indication for transfusion.   Continue to monitor CBC.  4.  Secondary hyperparathyroidism.  Serum phosphorus was at target at 3.9 at last check.  Continue to monitor bone mineral metabolism parameters.   LOS: 0 Tariq Pernell 10/28/202010:49 AM

## 2019-03-02 NOTE — Progress Notes (Signed)
Pulmonary Critical Care Medicine [place]   PULMONARY CRITICAL CARE SERVICE  PROGRESS NOTE  Date of Service: 03/02/2019  Paul Ball  [name]  DOB: [birth year]   DOA: 02/15/2019  Referring Physician: [name], MD  HPI: Paul Ball is a 73 y.o. male seen for follow up of Acute on Chronic Respiratory Failure.  Patient currently is on pressure support mode has been on 40% FiO2 yesterday did not tolerate  Medications: Reviewed on Rounds  Physical Exam:  Vitals: Temperature 98.1 pulse 97 respiratory 28 blood pressure 122/78 saturations 98%  Ventilator Settings mode ventilation pressure support FiO2 40% tidal volume 413 per support 14 PEEP 7  . General: Comfortable at this time . Eyes: Grossly normal lids, irises & conjunctiva . ENT: grossly tongue is normal . Neck: no obvious mass . Cardiovascular: S1 S2 normal no gallop . Respiratory: Coarse breath sounds are noted bilaterally . Abdomen: soft . Skin: no rash seen on limited exam . Musculoskeletal: not rigid . Psychiatric:unable to assess . Neurologic: no seizure no involuntary movements         Lab Data:   Basic Metabolic Panel: Recent Labs  Lab 02/25/19 0624 02/27/19 1430 02/28/19 1048 03/01/19 0530 03/02/19 0654  NA 138 138 138 135 135  K 4.1 4.0 4.8 3.7 3.7  CL 101 100 101 99 99  CO2 24 23 24 26 26   GLUCOSE 91 145* 88 77 48*  BUN 97* 83* 93* 59* 69*  CREATININE 4.90* 4.74* 5.32* 3.78* 4.24*  CALCIUM 8.6* 8.4* 8.5* 8.2* 8.3*  MG  --   --  2.7*  --   --   PHOS 5.3*  --  4.6 3.5 3.9    ABG: No results for input(s): PHART, PCO2ART, PO2ART, HCO3, O2SAT in the last 168 hours.  Liver Function Tests: Recent Labs  Lab 02/25/19 0624 03/01/19 0530 03/02/19 0654  ALBUMIN 1.7* 1.6* 1.5*   No results for input(s): LIPASE, AMYLASE in the last 168 hours. No results for input(s): AMMONIA in the last 168 hours.  CBC: Recent Labs  Lab 02/26/19 1139 02/28/19 1304 02/28/19 2004  03/01/19 0530 03/02/19 0654  WBC 8.0 6.7 6.5 8.7 8.6  HGB 7.2* 5.6* 5.9* 7.1* 7.4*  HCT 21.4* 17.3* 18.4* 21.8* 22.7*  MCV 82.6 85.6 86.8 86.2 85.7  PLT 205 209 221 207 214    Cardiac Enzymes: No results for input(s): CKTOTAL, CKMB, CKMBINDEX, TROPONINI in the last 168 hours.  BNP (last 3 results) No results for input(s): BNP in the last 8760 hours.  ProBNP (last 3 results) No results for input(s): PROBNP in the last 8760 hours.  Radiological Exams: Dg Chest Port 1 View  Result Date: 03/02/2019 CLINICAL DATA:  Shortness of breath EXAM: PORTABLE CHEST 1 VIEW COMPARISON:  02/24/2019 FINDINGS: Diffuse bilateral airspace disease and moderate layering effusions, similar to prior study. Tracheostomy and right Vas-Cath unchanged. IMPRESSION: No significant change diffuse bilateral airspace disease and effusions. Electronically Signed   By: Rolm Baptise M.D.   On: 03/02/2019 08:54    Assessment/Plan Active Problems:   Acute on chronic respiratory failure with hypoxia (HCC)   Acute on chronic systolic and diastolic heart failure, NYHA class 3 (HCC)   Acute renal failure superimposed on stage 3 chronic kidney disease (HCC)   Healthcare-associated pneumonia   COPD, severe ([place])   1. Acute on chronic respiratory failure hypoxia we will continue with try to wean on pressure support mode 2. Acute on chronic systolic heart failure right now appears  to be compensated 3. Acute renal failure on chronic kidney disease continue to follow labs nephrology is following 4. Healthcare associated pneumonia treated 5. Severe COPD at baseline continue present management   I have personally seen and evaluated the patient, evaluated laboratory and imaging results, formulated the assessment and plan and placed orders. The Patient requires high complexity decision making for assessment and support.  Case was discussed on Rounds with the Respiratory Therapy Staff  Allyne Gee, MD [facility] Pulmonary  Critical Care Medicine Sleep Medicine

## 2019-03-03 DIAGNOSIS — J9621 Acute and chronic respiratory failure with hypoxia: Secondary | ICD-10-CM | POA: Diagnosis not present

## 2019-03-03 DIAGNOSIS — I5043 Acute on chronic combined systolic (congestive) and diastolic (congestive) heart failure: Secondary | ICD-10-CM | POA: Diagnosis not present

## 2019-03-03 DIAGNOSIS — N179 Acute kidney failure, unspecified: Secondary | ICD-10-CM | POA: Diagnosis not present

## 2019-03-03 DIAGNOSIS — J449 Chronic obstructive pulmonary disease, unspecified: Secondary | ICD-10-CM | POA: Diagnosis not present

## 2019-03-03 LAB — CBC
HCT: 22.7 % — ABNORMAL LOW (ref 39.0–52.0)
Hemoglobin: 7.3 g/dL — ABNORMAL LOW (ref 13.0–17.0)
MCH: 28.2 pg (ref 26.0–34.0)
MCHC: 32.2 g/dL (ref 30.0–36.0)
MCV: 87.6 fL (ref 80.0–100.0)
Platelets: 218 10*3/uL (ref 150–400)
RBC: 2.59 MIL/uL — ABNORMAL LOW (ref 4.22–5.81)
RDW: 15.6 % — ABNORMAL HIGH (ref 11.5–15.5)
WBC: 8.5 10*3/uL (ref 4.0–10.5)
nRBC: 0 % (ref 0.0–0.2)

## 2019-03-03 NOTE — Progress Notes (Signed)
Pulmonary Critical Care Medicine [place]   PULMONARY CRITICAL CARE SERVICE  PROGRESS NOTE  Date of Service: 03/03/2019  Paul Ball  [id number]  DOB: 1945-08-21   DOA: 02/15/2019  Referring Physician: [name], MD  HPI: Paul Ball is a 73 y.o. male seen for follow up of Acute on Chronic Respiratory Failure.  Patient currently is on pressure support mode the goal today is for about 4 hours  Medications: Reviewed on Rounds  Physical Exam:  Vitals: Temperature 97.6 pulse 69 respiratory rate 26 blood pressure 161/60 saturations 97%  Ventilator Settings patient is on pressure support FiO2 35%  . General: Comfortable at this time . Eyes: Grossly normal lids, irises & conjunctiva . ENT: grossly tongue is normal . Neck: no obvious mass . Cardiovascular: S1 S2 normal no gallop . Respiratory: No rhonchi coarse breath sounds . Abdomen: soft . Skin: no rash seen on limited exam . Musculoskeletal: not rigid . Psychiatric:unable to assess . Neurologic: no seizure no involuntary movements         Lab Data:   Basic Metabolic Panel: Recent Labs  Lab 02/25/19 0624 02/27/19 1430 02/28/19 1048 03/01/19 0530 03/02/19 0654  NA 138 138 138 135 135  K 4.1 4.0 4.8 3.7 3.7  CL 101 100 101 99 99  CO2 24 23 24 26 26   GLUCOSE 91 145* 88 77 48*  BUN 97* 83* 93* 59* 69*  CREATININE 4.90* 4.74* 5.32* 3.78* 4.24*  CALCIUM 8.6* 8.4* 8.5* 8.2* 8.3*  MG  --   --  2.7*  --   --   PHOS 5.3*  --  4.6 3.5 3.9    ABG: No results for input(s): PHART, PCO2ART, PO2ART, HCO3, O2SAT in the last 168 hours.  Liver Function Tests: Recent Labs  Lab 02/25/19 0624 03/01/19 0530 03/02/19 0654  ALBUMIN 1.7* 1.6* 1.5*   No results for input(s): LIPASE, AMYLASE in the last 168 hours. No results for input(s): AMMONIA in the last 168 hours.  CBC: Recent Labs  Lab 02/28/19 1304 02/28/19 2004 03/01/19 0530 03/02/19 0654 03/03/19 0753  WBC 6.7 6.5 8.7 8.6 8.5   HGB 5.6* 5.9* 7.1* 7.4* 7.3*  HCT 17.3* 18.4* 21.8* 22.7* 22.7*  MCV 85.6 86.8 86.2 85.7 87.6  PLT 209 221 207 214 218    Cardiac Enzymes: No results for input(s): CKTOTAL, CKMB, CKMBINDEX, TROPONINI in the last 168 hours.  BNP (last 3 results) No results for input(s): BNP in the last 8760 hours.  ProBNP (last 3 results) No results for input(s): PROBNP in the last 8760 hours.  Radiological Exams: Dg Chest Port 1 View  Result Date: 03/02/2019 CLINICAL DATA:  Shortness of breath EXAM: PORTABLE CHEST 1 VIEW COMPARISON:  02/24/2019 FINDINGS: Diffuse bilateral airspace disease and moderate layering effusions, similar to prior study. Tracheostomy and right Vas-Cath unchanged. IMPRESSION: No significant change diffuse bilateral airspace disease and effusions. Electronically Signed   By: Rolm Baptise M.D.   On: 03/02/2019 08:54    Assessment/Plan Active Problems:   Acute on chronic respiratory failure with hypoxia (HCC)   Acute on chronic systolic and diastolic heart failure, NYHA class 3 (HCC)   Acute renal failure superimposed on stage 3 chronic kidney disease (HCC)   Healthcare-associated pneumonia   COPD, severe ([place])   1. Acute on chronic respiratory failure with hypoxia we will continue with weaning on the pressure support titrate oxygen continue pulmonary toilet. 2. Acute on chronic systolic heart failure compensated 3. Acute renal failure follow-up labs  4. Healthcare associated pneumonia treated 5. Severe COPD patient is at baseline   I have personally seen and evaluated the patient, evaluated laboratory and imaging results, formulated the assessment and plan and placed orders. The Patient requires high complexity decision making for assessment and support.  Case was discussed on Rounds with the Respiratory Therapy Staff  Allyne Gee, MD [facility] Pulmonary Critical Care Medicine Sleep Medicine

## 2019-03-04 DIAGNOSIS — N179 Acute kidney failure, unspecified: Secondary | ICD-10-CM | POA: Diagnosis not present

## 2019-03-04 DIAGNOSIS — J9621 Acute and chronic respiratory failure with hypoxia: Secondary | ICD-10-CM | POA: Diagnosis not present

## 2019-03-04 DIAGNOSIS — I5043 Acute on chronic combined systolic (congestive) and diastolic (congestive) heart failure: Secondary | ICD-10-CM | POA: Diagnosis not present

## 2019-03-04 DIAGNOSIS — J449 Chronic obstructive pulmonary disease, unspecified: Secondary | ICD-10-CM | POA: Diagnosis not present

## 2019-03-04 LAB — CBC
HCT: 20.8 % — ABNORMAL LOW (ref 39.0–52.0)
HCT: 26.3 % — ABNORMAL LOW (ref 39.0–52.0)
Hemoglobin: 6.7 g/dL — CL (ref 13.0–17.0)
Hemoglobin: 8.7 g/dL — ABNORMAL LOW (ref 13.0–17.0)
MCH: 28 pg (ref 26.0–34.0)
MCH: 27.7 pg (ref 26.0–34.0)
MCHC: 32.2 g/dL (ref 30.0–36.0)
MCHC: 33.1 g/dL (ref 30.0–36.0)
MCV: 87 fL (ref 80.0–100.0)
MCV: 83.8 fL (ref 80.0–100.0)
Platelets: 207 10*3/uL (ref 150–400)
Platelets: 215 10*3/uL (ref 150–400)
RBC: 2.39 MIL/uL — ABNORMAL LOW (ref 4.22–5.81)
RBC: 3.14 MIL/uL — ABNORMAL LOW (ref 4.22–5.81)
RDW: 15.5 % (ref 11.5–15.5)
RDW: 16.1 % — ABNORMAL HIGH (ref 11.5–15.5)
WBC: 7.7 10*3/uL (ref 4.0–10.5)
WBC: 8.8 10*3/uL (ref 4.0–10.5)
nRBC: 0 % (ref 0.0–0.2)
nRBC: 0 % (ref 0.0–0.2)

## 2019-03-04 LAB — PREPARE RBC (CROSSMATCH)

## 2019-03-04 LAB — RENAL FUNCTION PANEL
Albumin: 1.5 g/dL — ABNORMAL LOW (ref 3.5–5.0)
Anion gap: 13 (ref 5–15)
BUN: 83 mg/dL — ABNORMAL HIGH (ref 8–23)
CO2: 24 mmol/L (ref 22–32)
Calcium: 8.3 mg/dL — ABNORMAL LOW (ref 8.9–10.3)
Chloride: 96 mmol/L — ABNORMAL LOW (ref 98–111)
Creatinine, Ser: 4.75 mg/dL — ABNORMAL HIGH (ref 0.61–1.24)
GFR calc Af Amer: 13 mL/min — ABNORMAL LOW (ref >60)
GFR calc non Af Amer: 11 mL/min — ABNORMAL LOW (ref >60)
Glucose, Bld: 176 mg/dL — ABNORMAL HIGH (ref 70–99)
Phosphorus: 4.1 mg/dL (ref 2.5–4.6)
Potassium: 3.8 mmol/L (ref 3.5–5.1)
Sodium: 133 mmol/L — ABNORMAL LOW (ref 135–145)

## 2019-03-04 NOTE — Progress Notes (Signed)
[facility]  ROUNDING NOTE   Subjective:  Patient seen at bedside. Remains critically ill. FiO2 up slightly to 45% with a PEEP of 7.    Objective:  Vital signs in last 24 hours:  Temperature 96.8 pulse 72 respirations 24 blood pressure 107/81  Physical Exam: General: Critically ill-appearing  Head: Normocephalic, atraumatic. Moist oral mucosal membranes  Eyes: Anicteric  Neck: Tracheostomy in place  Lungs:  Scattered rhonchi, vent assisted  Heart: S1S2 no rubs  Abdomen:  Soft, nontender, bowel sounds present, PEG in place  Extremities: 2+ peripheral edema.  Neurologic: Not following commands  Skin: No rash  Access: Right internal jugular temporary dialysis catheter    Basic Metabolic Panel: Recent Labs  Lab 02/27/19 1430 02/28/19 1048 03/01/19 0530 03/02/19 0654 03/04/19 0542  NA 138 138 135 135 133*  K 4.0 4.8 3.7 3.7 3.8  CL 100 101 99 99 96*  CO2 23 24 26 26 24   GLUCOSE 145* 88 77 48* 176*  BUN 83* 93* 59* 69* 83*  CREATININE 4.74* 5.32* 3.78* 4.24* 4.75*  CALCIUM 8.4* 8.5* 8.2* 8.3* 8.3*  MG  --  2.7*  --   --   --   PHOS  --  4.6 3.5 3.9 4.1    Liver Function Tests: Recent Labs  Lab 03/01/19 0530 03/02/19 0654 03/04/19 0542  ALBUMIN 1.6* 1.5* 1.5*   No results for input(s): LIPASE, AMYLASE in the last 168 hours. No results for input(s): AMMONIA in the last 168 hours.  CBC: Recent Labs  Lab 02/28/19 2004 03/01/19 0530 03/02/19 0654 03/03/19 0753 03/04/19 0542  WBC 6.5 8.7 8.6 8.5 7.7  HGB 5.9* 7.1* 7.4* 7.3* 6.7*  HCT 18.4* 21.8* 22.7* 22.7* 20.8*  MCV 86.8 86.2 85.7 87.6 87.0  PLT 221 207 214 218 207    Cardiac Enzymes: No results for input(s): CKTOTAL, CKMB, CKMBINDEX, TROPONINI in the last 168 hours.  BNP: Invalid input(s): POCBNP  CBG: No results for input(s): GLUCAP in the last 168 hours.  Microbiology: Results for orders placed or performed during the hospital encounter of 02/15/19  Culture, Urine     Status:  None   Collection Time: 02/15/19 12:52 AM   Specimen: Urine, Random  Result Value Ref Range Status   Specimen Description URINE, RANDOM  Final   Special Requests NONE  Final   Culture   Final    NO GROWTH Performed at [facility], [place] [address] [place], [place] [address]    Report Status 02/17/2019 FINAL  Final  Culture, respiratory (non-expectorated)     Status: None   Collection Time: 02/15/19 10:50 PM   Specimen: Tracheal Aspirate; Respiratory  Result Value Ref Range Status   Specimen Description TRACHEAL ASPIRATE  Final   Special Requests NONE  Final   Gram Stain   Final    NO WBC SEEN RARE GRAM POSITIVE COCCI IN PAIRS Performed at [facility], [address], [place], [place] [address]    Culture MODERATE ENTEROCOCCUS FAECALIS  Final   Report Status 02/19/2019 FINAL  Final   Organism ID, Bacteria ENTEROCOCCUS FAECALIS  Final      Susceptibility   Enterococcus faecalis - MIC*    AMPICILLIN <=2 SENSITIVE Sensitive     VANCOMYCIN 1 SENSITIVE Sensitive     GENTAMICIN SYNERGY RESISTANT Resistant     * MODERATE ENTEROCOCCUS FAECALIS  SARS Coronavirus 2 by RT PCR (hospital order, performed in [facility] hospital lab) Nasopharyngeal Nasopharyngeal Swab  Status: None   Collection Time: 02/16/19  2:20 PM   Specimen: Nasopharyngeal Swab  Result Value Ref Range Status   SARS Coronavirus 2 NEGATIVE NEGATIVE Final    Comment: (NOTE) If result is NEGATIVE SARS-CoV-2 target nucleic acids are NOT DETECTED. The SARS-CoV-2 RNA is generally detectable in upper and lower  respiratory specimens during the acute phase of infection. The lowest  concentration of SARS-CoV-2 viral copies this assay can detect is 250  copies / mL. A negative result does not preclude SARS-CoV-2 infection  and should not be used as the sole basis for treatment or other  patient management decisions.  A negative result may occur with  improper specimen collection / handling, submission of  specimen other  than nasopharyngeal swab, presence of viral mutation(s) within the  areas targeted by this assay, and inadequate number of viral copies  (<250 copies / mL). A negative result must be combined with clinical  observations, patient history, and epidemiological information. If result is POSITIVE SARS-CoV-2 target nucleic acids are DETECTED. The SARS-CoV-2 RNA is generally detectable in upper and lower  respiratory specimens dur ing the acute phase of infection.  Positive  results are indicative of active infection with SARS-CoV-2.  Clinical  correlation with patient history and other diagnostic information is  necessary to determine patient infection status.  Positive results do  not rule out bacterial infection or co-infection with other viruses. If result is PRESUMPTIVE POSTIVE SARS-CoV-2 nucleic acids MAY BE PRESENT.   A presumptive positive result was obtained on the submitted specimen  and confirmed on repeat testing.  While 2019 novel coronavirus  (SARS-CoV-2) nucleic acids may be present in the submitted sample  additional confirmatory testing may be necessary for epidemiological  and / or clinical management purposes  to differentiate between  SARS-CoV-2 and other Sarbecovirus currently known to infect humans.  If clinically indicated additional testing with an alternate test  methodology [phone]) is advised. The SARS-CoV-2 RNA is generally  detectable in upper and lower respiratory sp ecimens during the acute  phase of infection. The expected result is Negative. Fact Sheet for Patients:  StrictlyIdeas.no Fact Sheet for Healthcare Providers: BankingDealers.co.za This test is not yet approved or cleared by the [place] FDA and has been authorized for detection and/or diagnosis of SARS-CoV-2 by FDA under an Emergency Use Authorization (EUA).  This EUA will remain in effect (meaning this test can be used) for the  duration of the COVID-19 declaration under Section 564(b)(1) of the Act, 21 U.S.C. section 360bbb-3(b)(1), unless the authorization is terminated or revoked sooner. Performed at [facility], [place] [address] [place], [place] [address]     Coagulation Studies: No results for input(s): LABPROT, INR in the last 72 hours.  Urinalysis: No results for input(s): COLORURINE, LABSPEC, PHURINE, GLUCOSEU, HGBUR, BILIRUBINUR, KETONESUR, PROTEINUR, UROBILINOGEN, NITRITE, LEUKOCYTESUR in the last 72 hours.  Invalid input(s): APPERANCEUR    Imaging: No results found.   Medications:     heparin, iohexol, lidocaine  Assessment/ Plan:  73 y.o. male with a PMHx of BPH, allergic rhinitis, COPD, diabetes mellitus type 2, hypertension, GERD, acute respiratory failure, congestive heart failure, chronic kidney disease stage IIIb EGFR 43 who was admitted to [facility] on 02/15/2019 for ongoing management of acute respiratory failure.   1.  Acute kidney injury/chronic kidney disease stage IIIb baseline GFR 43.    Patient will be maintained on dialysis on MWF.  Orders have been prepared.  2.  Acute respiratory failure.  Patient  still on the ventilator.  FiO2 up to 45% with a PEEP of 7.  Continue weaning efforts.  3.  Anemia of chronic kidney disease.  Hemoglobin down to 6.7.  Blood transfusion x1 unit planned by hospitalist today.  4.  Secondary hyperparathyroidism.  Phosphorus at target at 4.1.  Continue periodically monitor.   LOS: 0 Jibril Mcminn 10/30/20208:45 AM

## 2019-03-04 NOTE — Progress Notes (Signed)
Pulmonary Critical Care Medicine [place]   PULMONARY CRITICAL CARE SERVICE  PROGRESS NOTE  Date of Service: 03/04/2019  Paul Ball  [name]  DOB: 10-16-1945   DOA: 02/15/2019  Referring Physician: [name], MD  HPI: Paul Ball is a 73 y.o. male seen for follow up of Acute on Chronic Respiratory Failure.  Patient right now is on full support on assist control mode has been on 45% FiO2  Medications: Reviewed on Rounds  Physical Exam:  Vitals: Temperature 96.8 pulse 72 respiratory 24 blood pressure 107/81  Ventilator Settings mode ventilation assist control FiO2 45% tidal line 420 PEEP 7  . General: Comfortable at this time . Eyes: Grossly normal lids, irises & conjunctiva . ENT: grossly tongue is normal . Neck: no obvious mass . Cardiovascular: S1 S2 normal no gallop . Respiratory: No rhonchi coarse breath sounds are noted . Abdomen: soft . Skin: no rash seen on limited exam . Musculoskeletal: not rigid . Psychiatric:unable to assess . Neurologic: no seizure no involuntary movements         Lab Data:   Basic Metabolic Panel: Recent Labs  Lab 02/27/19 1430 02/28/19 1048 03/01/19 0530 03/02/19 0654 03/04/19 0542  NA 138 138 135 135 133*  K 4.0 4.8 3.7 3.7 3.8  CL 100 101 99 99 96*  CO2 23 24 26 26 24   GLUCOSE 145* 88 77 48* 176*  BUN 83* 93* 59* 69* 83*  CREATININE 4.74* 5.32* 3.78* 4.24* 4.75*  CALCIUM 8.4* 8.5* 8.2* 8.3* 8.3*  MG  --  2.7*  --   --   --   PHOS  --  4.6 3.5 3.9 4.1    ABG: No results for input(s): PHART, PCO2ART, PO2ART, HCO3, O2SAT in the last 168 hours.  Liver Function Tests: Recent Labs  Lab 03/01/19 0530 03/02/19 0654 03/04/19 0542  ALBUMIN 1.6* 1.5* 1.5*   No results for input(s): LIPASE, AMYLASE in the last 168 hours. No results for input(s): AMMONIA in the last 168 hours.  CBC: Recent Labs  Lab 02/28/19 2004 03/01/19 0530 03/02/19 0654 03/03/19 0753 03/04/19 0542  WBC 6.5 8.7 8.6  8.5 7.7  HGB 5.9* 7.1* 7.4* 7.3* 6.7*  HCT 18.4* 21.8* 22.7* 22.7* 20.8*  MCV 86.8 86.2 85.7 87.6 87.0  PLT 221 207 214 218 207    Cardiac Enzymes: No results for input(s): CKTOTAL, CKMB, CKMBINDEX, TROPONINI in the last 168 hours.  BNP (last 3 results) No results for input(s): BNP in the last 8760 hours.  ProBNP (last 3 results) No results for input(s): PROBNP in the last 8760 hours.  Radiological Exams: No results found.  Assessment/Plan Active Problems:   Acute on chronic respiratory failure with hypoxia (HCC)   Acute on chronic systolic and diastolic heart failure, NYHA class 3 (HCC)   Acute renal failure superimposed on stage 3 chronic kidney disease (HCC)   Healthcare-associated pneumonia   COPD, severe ([place])   1. Acute on chronic respiratory failure hypoxia patient currently is on 45% FiO2 patient is also receiving blood no weaning is being done at this time 2. Acute on chronic diastolic heart failure monitor fluid status closely 3. Acute renal failure stage III followed by nephrology for dialysis 4. Healthcare associated pneumonia treated 5. Severe COPD at baseline continue present management   I have personally seen and evaluated the patient, evaluated laboratory and imaging results, formulated the assessment and plan and placed orders. The Patient requires high complexity decision making for assessment and support.  Case was discussed on Rounds with the Respiratory Therapy Staff  Allyne Gee, MD [facility] Pulmonary Critical Care Medicine Sleep Medicine

## 2019-03-05 DIAGNOSIS — J449 Chronic obstructive pulmonary disease, unspecified: Secondary | ICD-10-CM | POA: Diagnosis not present

## 2019-03-05 DIAGNOSIS — N179 Acute kidney failure, unspecified: Secondary | ICD-10-CM | POA: Diagnosis not present

## 2019-03-05 DIAGNOSIS — I5043 Acute on chronic combined systolic (congestive) and diastolic (congestive) heart failure: Secondary | ICD-10-CM | POA: Diagnosis not present

## 2019-03-05 DIAGNOSIS — D649 Anemia, unspecified: Secondary | ICD-10-CM

## 2019-03-05 DIAGNOSIS — R195 Other fecal abnormalities: Secondary | ICD-10-CM

## 2019-03-05 DIAGNOSIS — J9621 Acute and chronic respiratory failure with hypoxia: Secondary | ICD-10-CM | POA: Diagnosis not present

## 2019-03-05 LAB — CBC
HCT: 22.9 % — ABNORMAL LOW (ref 39.0–52.0)
Hemoglobin: 7.5 g/dL — ABNORMAL LOW (ref 13.0–17.0)
MCH: 27.8 pg (ref 26.0–34.0)
MCHC: 32.8 g/dL (ref 30.0–36.0)
MCV: 84.8 fL (ref 80.0–100.0)
Platelets: 195 10*3/uL (ref 150–400)
RBC: 2.7 MIL/uL — ABNORMAL LOW (ref 4.22–5.81)
RDW: 16.4 % — ABNORMAL HIGH (ref 11.5–15.5)
WBC: 7.1 10*3/uL (ref 4.0–10.5)
nRBC: 0 % (ref 0.0–0.2)

## 2019-03-05 NOTE — Progress Notes (Signed)
Pulmonary Critical Care Medicine [place]   PULMONARY CRITICAL CARE SERVICE  PROGRESS NOTE  Date of Service: 03/05/2019  Paul Ball  [id number]  DOB: 07-08-1945   DOA: 02/15/2019  Referring Physician: [name], MD  HPI: Paul Ball is a 73 y.o. male seen for follow up of Acute on Chronic Respiratory Failure.  Patient remains on full support on pressure support mode on 50% FiO2 with a goal of 4 hours  Medications: Reviewed on Rounds  Physical Exam:  Vitals: Temperature 98.6 pulse 65 respiratory rate 30 blood pressure 165/55 saturations 94%  Ventilator Settings mode ventilation pressure support FiO2 50% tidal volume 45 per support 12/5  . General: Comfortable at this time . Eyes: Grossly normal lids, irises & conjunctiva . ENT: grossly tongue is normal . Neck: no obvious mass . Cardiovascular: S1 S2 normal no gallop . Respiratory: No rhonchi coarse breath sounds are noted at this time . Abdomen: soft . Skin: no rash seen on limited exam . Musculoskeletal: not rigid . Psychiatric:unable to assess . Neurologic: no seizure no involuntary movements         Lab Data:   Basic Metabolic Panel: Recent Labs  Lab 02/27/19 1430 02/28/19 1048 03/01/19 0530 03/02/19 0654 03/04/19 0542  NA 138 138 135 135 133*  K 4.0 4.8 3.7 3.7 3.8  CL 100 101 99 99 96*  CO2 23 24 26 26 24   GLUCOSE 145* 88 77 48* 176*  BUN 83* 93* 59* 69* 83*  CREATININE 4.74* 5.32* 3.78* 4.24* 4.75*  CALCIUM 8.4* 8.5* 8.2* 8.3* 8.3*  MG  --  2.7*  --   --   --   PHOS  --  4.6 3.5 3.9 4.1    ABG: No results for input(s): PHART, PCO2ART, PO2ART, HCO3, O2SAT in the last 168 hours.  Liver Function Tests: Recent Labs  Lab 03/01/19 0530 03/02/19 0654 03/04/19 0542  ALBUMIN 1.6* 1.5* 1.5*   No results for input(s): LIPASE, AMYLASE in the last 168 hours. No results for input(s): AMMONIA in the last 168 hours.  CBC: Recent Labs  Lab 03/02/19 0654 03/03/19 0753  03/04/19 0542 03/04/19 1811 03/05/19 0705  WBC 8.6 8.5 7.7 8.8 7.1  HGB 7.4* 7.3* 6.7* 8.7* 7.5*  HCT 22.7* 22.7* 20.8* 26.3* 22.9*  MCV 85.7 87.6 87.0 83.8 84.8  PLT 214 218 207 215 195    Cardiac Enzymes: No results for input(s): CKTOTAL, CKMB, CKMBINDEX, TROPONINI in the last 168 hours.  BNP (last 3 results) No results for input(s): BNP in the last 8760 hours.  ProBNP (last 3 results) No results for input(s): PROBNP in the last 8760 hours.  Radiological Exams: No results found.  Assessment/Plan Active Problems:   Acute on chronic respiratory failure with hypoxia (HCC)   Acute on chronic systolic and diastolic heart failure, NYHA class 3 (HCC)   Acute renal failure superimposed on stage 3 chronic kidney disease (HCC)   Healthcare-associated pneumonia   COPD, severe ([place])   1. Acute on chronic respiratory failure with hypoxia plan is to continue with weaning protocol today the goal is for 4 hours 2. Acute on chronic systolic and diastolic heart failure compensated monitor fluid status closely 3. Acute renal failure followed by nephrology 4. Healthcare associated pneumonia treated 5. Severe COPD at baseline continue present management   I have personally seen and evaluated the patient, evaluated laboratory and imaging results, formulated the assessment and plan and placed orders. The Patient requires high complexity decision making for  assessment and support.  Case was discussed on Rounds with the Respiratory Therapy Staff  Allyne Gee, MD [facility] Pulmonary Critical Care Medicine Sleep Medicine

## 2019-03-06 DIAGNOSIS — I5043 Acute on chronic combined systolic (congestive) and diastolic (congestive) heart failure: Secondary | ICD-10-CM | POA: Diagnosis not present

## 2019-03-06 DIAGNOSIS — J449 Chronic obstructive pulmonary disease, unspecified: Secondary | ICD-10-CM | POA: Diagnosis not present

## 2019-03-06 DIAGNOSIS — J9621 Acute and chronic respiratory failure with hypoxia: Secondary | ICD-10-CM | POA: Diagnosis not present

## 2019-03-06 DIAGNOSIS — N179 Acute kidney failure, unspecified: Secondary | ICD-10-CM | POA: Diagnosis not present

## 2019-03-06 LAB — CBC
HCT: 22.3 % — ABNORMAL LOW (ref 39.0–52.0)
Hemoglobin: 7.3 g/dL — ABNORMAL LOW (ref 13.0–17.0)
MCH: 27.3 pg (ref 26.0–34.0)
MCHC: 32.7 g/dL (ref 30.0–36.0)
MCV: 83.5 fL (ref 80.0–100.0)
Platelets: 184 10*3/uL (ref 150–400)
RBC: 2.67 MIL/uL — ABNORMAL LOW (ref 4.22–5.81)
RDW: 15.9 % — ABNORMAL HIGH (ref 11.5–15.5)
WBC: 7.6 10*3/uL (ref 4.0–10.5)
nRBC: 0 % (ref 0.0–0.2)

## 2019-03-06 MED ORDER — GENERIC EXTERNAL MEDICATION
Status: DC
Start: ? — End: 2019-03-06

## 2019-03-06 NOTE — Consult Note (Signed)
Gastroenterology Inpatient Consultation   Attending Requesting Consult [name], [facility] Day: 20  Reason for Consult Transfusion dependent anemia and positive FOBT   History of Present Illness  Paul Ball is a 73 y.o. male with a pmh significant for COPD, renal failure, heart failure, diabetes, hypertension, MDD, CVA, GERD, status post trach/PEG.  The GI service is consulted for evaluation and management of transfusion dependent anemia in critically ill patient with positive occult blood testing.  Unfortunately, as a result of the patient being in specialty hospital and not having great access to prior hospitalization history full background is not clearly defined.  From what I can gather however the patient has had issues with a recent stroke.  He then progressed to have renal failure leading to placement of a dialysis line.  He has required intermittent transfusions.  He has been on Pepcid while on the specialty floor.  Yesterday this was transitioned to PPI.  And then transition to IV PPI.  He has not been on any blood thinners other than for heparin VTE prophylaxis.  He receives dialysis on Monday/Wednesday/Friday.  He ended up having changes in his blood counts.  These are noted below but he has had a hemoglobin in the 5 range on 26 October for which transfusions were given with subsequent improvement however yesterday his hemoglobin dropped once again into the 6 range and he required another transfusion.  There has been no notation of the patient having dark melanic stools.  But he has required greater than 40 units of blood in the last week.  It is not clear if he has had an upper or lower endoscopy previously.  GI Review of Systems Unable to obtain due to patient's clinical status   Review of Systems  Unable to obtain due to patient's clinical status   Histories  Past Medical History Past Medical History:  Diagnosis Date  . Acute on chronic respiratory  failure with hypoxia ([place])   . Acute on chronic systolic and diastolic heart failure, NYHA class 3 ([place])   . Acute renal failure superimposed on stage 3 chronic kidney disease ([place])   . COPD, severe ([place])   . Healthcare-associated pneumonia    The histories are not reviewed yet. Please review them in the "History" navigator section and refresh this [place].  Allergies Not on File  Family History No family history on file.   Social History Social History   Socioeconomic History  . Marital status: Married    Spouse name: Not on file  . Number of children: Not on file  . Years of education: Not on file  . Highest education level: Not on file  Occupational History  . Not on file  Social Needs  . Financial resource strain: Not on file  . Food insecurity    Worry: Not on file    Inability: Not on file  . Transportation needs    Medical: Not on file    Non-medical: Not on file  Tobacco Use  . Smoking status: Not on file  Substance and Sexual Activity  . Alcohol use: Not on file  . Drug use: Not on file  . Sexual activity: Not on file  Lifestyle  . Physical activity    Days per week: Not on file    Minutes per session: Not on file  . Stress: Not on file  Relationships  . Social Herbalist on phone: Not on file    Gets  together: Not on file    Attends religious service: Not on file    Active member of club or organization: Not on file    Attends meetings of clubs or organizations: Not on file    Relationship status: Not on file  . Intimate partner violence    Fear of current or ex partner: Not on file    Emotionally abused: Not on file    Physically abused: Not on file    Forced sexual activity: Not on file  Other Topics Concern  . Not on file  Social History Narrative  . Not on file    Medications  Home Medications No current facility-administered medications on file prior to encounter.    No current outpatient medications on file prior to  encounter.   Scheduled Inpatient Medications  Continuous Inpatient Infusions  PRN Inpatient Medications heparin, iohexol, lidocaine   Physical Examination  There were no vitals taken for this visit. GEN: Chronically ill-appearing in bed PSYCH: Unable to express any symptoms or emotions EYE: Conjunctivae pale-pink ENT: Dry mucous membranes NECK: Tracheostomy in place CV: Nontachycardic RESP: Wheezing appreciated GI: PEG tube in place with tube feeds ongoing, cachectic abdomen MSK/EXT: Lower extremity edema present SKIN: No jaundice NEURO: Patient arousable to sternal rub with some grimacing   Review of Data  I reviewed the following data at the time of this encounter:  Laboratory Studies   Recent Labs  Lab 02/28/19 1048 03/01/19 0530 03/02/19 0654 03/04/19 0542  NA 138 135 135 133*  K 4.8 3.7 3.7 3.8  CL 101 99 99 96*  CO2 24 26 26 24   BUN 93* 59* 69* 83*  CREATININE 5.32* 3.78* 4.24* 4.75*  GLUCOSE 88 77 48* 176*  CALCIUM 8.5* 8.2* 8.3* 8.3*  MG 2.7*  --   --   --   PHOS 4.6 3.5 3.9 4.1   No results for input(s): AST, ALT, GGT, ALKPHOS in the last 168 hours.  Invalid input(s): TBILI, CONJBILI, ALB  Recent Labs  Lab 03/04/19 0542 03/04/19 1811 03/05/19 0705  WBC 7.7 8.8 7.1  HGB 6.7* 8.7* 7.5*  HCT 20.8* 26.3* 22.9*  PLT 207 215 195   No results for input(s): APTT, INR in the last 168 hours.   02/20/2019 07:06 02/21/2019 07:28 02/22/2019 05:25 02/23/2019 05:54 02/25/2019 06:24 02/26/2019 11:39 02/28/2019 13:04 02/28/2019 20:04 03/01/2019 05:30 03/02/2019 06:54 03/03/2019 07:53 03/04/2019 05:42 03/04/2019 18:11 03/05/2019 07:05  Hemoglobin 8.6 (L) 7.5 (L) 7.8 (L) 7.2 (L) 7.1 (L) 7.2 (L) 5.6 (LL) 5.9 (LL) 7.1 (L) 7.4 (L) 7.3 (L) 6.7 (LL) 8.7 (L) 7.5 (L)    Studies  No relevant GI studies in the system and no relevant imaging studies from a GI perspective   Assessment  Paul Ball is a 73 y.o. male with a pmh significant for COPD, renal failure, heart  failure, diabetes, hypertension, MDD, CVA, GERD, status post trach/PEG.  The GI service is consulted for evaluation and management of transfusion dependent anemia in critically ill patient with positive occult blood testing.  The patient is critically ill and still requiring full support on pressure support/venting.  They have been trying to decrease his vent goals for his issues of respiratory failure.  There is no doubt that he has had a drop in his hemoglobin over the course of the last week and has required transfusions.  No overt GI bleeding other than occult positive blood.  With that being said the degree of blood loss based on his exam and clinical  stability does suggest the possibility of an underlying GI issue.  We will try to get consent from the patient's family on Sunday.  If they agreed to move forward with an endoscopy then we will arrange this most likely for Monday.  He can continue his tube feeds for now.  IV PPI twice daily should be maintained for now.  If patient develops hemodynamic compromise or progressive anemia please let us know if this is before Monday.  We will consider an earlier endoscopy.  I am okay to maintain tube feeds for now.  If we get permission to move forward with an endoscopic evaluation and it performed on Monday by my partner Dr. Tarri Glenn who takes over the service will be the one to perform the procedure.   Plan/Recommendations  Okay for tube feeds for now Plan for tentative EGD on 11/2 if consent obtained from family IV PPI twice daily CBC as per primary medical service Hold Heparin/VTE PPx   Thank you for this consult.  We will continue to follow.  Please page/call with questions or concerns.   Justice Britain, MD [facility] # [phone]

## 2019-03-06 NOTE — Progress Notes (Signed)
I called this morning the patient's daughter Trinton Macfadyen at [phone].  She is the patient's power of attorney.  We went over the reason for our consultation of her father as well as the thought process about performing an upper endoscopy for further evaluation of the anemia and occult blood positive stool.  The risks and benefits of endoscopic evaluation were discussed with the patient; these include but are not limited to the risk of perforation, infection, bleeding, missed lesions, lack of diagnosis, severe illness requiring hospitalization, as well as anesthesia and sedation related illnesses.  The patient's daughter is agreeable to proceed.  We will plan to proceed with an upper endoscopy tomorrow.  Tube feeds to be on hold starting at midnight.  I will relay this information to Dr. Tarri Glenn who takes over the inpatient GI service tomorrow.  Justice Britain, MD [facility] # [phone]

## 2019-03-06 NOTE — H&P (View-Only) (Signed)
Gastroenterology Inpatient Consultation   Attending Requesting Consult Paul Ball, Paul Ball Hospital Day: 20  Reason for Consult Transfusion dependent anemia and positive FOBT   History of Present Illness  Paul Ball is a 73 y.o. male with a pmh significant for COPD, renal failure, heart failure, diabetes, hypertension, MDD, CVA, GERD, status post trach/PEG.  The GI service is consulted for evaluation and management of transfusion dependent anemia in critically ill patient with positive occult blood testing.  Unfortunately, as a result of the patient being in specialty hospital and not having great access to prior hospitalization history full background is not clearly defined.  From what I can gather however the patient has had issues with a recent stroke.  He then progressed to have renal failure leading to placement of a dialysis line.  He has required intermittent transfusions.  He has been on Pepcid while on the specialty floor.  Yesterday this was transitioned to PPI.  And then transition to IV PPI.  He has not been on any blood thinners other than for heparin VTE prophylaxis.  He receives dialysis on Monday/Wednesday/Friday.  He ended up having changes in his blood counts.  These are noted below but he has had a hemoglobin in the 5 range on 26 October for which transfusions were given with subsequent improvement however yesterday his hemoglobin dropped once again into the 6 range and he required another transfusion.  There has been no notation of the patient having dark melanic stools.  But he has required greater than 40 units of blood in the last week.  It is not clear if he has had an upper or lower endoscopy previously.  GI Review of Systems Unable to obtain due to patient's clinical status   Review of Systems  Unable to obtain due to patient's clinical status   Histories  Past Medical History Past Medical History:  Diagnosis Date  . Acute on chronic respiratory  failure with hypoxia ([place])   . 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 Acute renal failure superimposed on stage 3 chronic kidney disease ([place])   . COPD, severe ([place])   . Healthcare-associated pneumonia    The histories are not reviewed yet. Please review them in the "History" navigator section and refresh this [place].  Allergies Not on File  Family History No family history on file.   Social History Social History   Socioeconomic History  . Marital status: Married    Spouse name: Not on file  . Number of children: Not on file  . Years of education: Not on file  . Highest education level: Not on file  Occupational History  . Not on file  Social Needs  . Financial resource strain: Not on file  . Food insecurity    Worry: Not on file    Inability: Not on file  . Transportation needs    Medical: Not on file    Non-medical: Not on file  Tobacco Use  . Smoking status: Not on file  Substance and Sexual Activity  . Alcohol use: Not on file  . Drug use: Not on file  . Sexual activity: Not on file  Lifestyle  . Physical activity    Days per week: Not on file    Minutes per session: Not on file  . Stress: Not on file  Relationships  . Social Herbalist on phone: Not on file    Gets  together: Not on file    Attends religious service: Not on file    Active member of club or organization: Not on file    Attends meetings of clubs or organizations: Not on file    Relationship status: Not on file  . Intimate partner violence    Fear of current or ex partner: Not on file    Emotionally abused: Not on file    Physically abused: Not on file    Forced sexual activity: Not on file  Other Topics Concern  . Not on file  Social History Narrative  . Not on file    Medications  Home Medications No current facility-administered medications on file prior to encounter.    No current outpatient medications on file prior to  encounter.   Scheduled Inpatient Medications  Continuous Inpatient Infusions  PRN Inpatient Medications heparin, iohexol, lidocaine   Physical Examination  There were no vitals taken for this visit. GEN: Chronically ill-appearing in bed PSYCH: Unable to express any symptoms or emotions EYE: Conjunctivae pale-pink ENT: Dry mucous membranes NECK: Tracheostomy in place CV: Nontachycardic RESP: Wheezing appreciated GI: PEG tube in place with tube feeds ongoing, cachectic abdomen MSK/EXT: Lower extremity edema present SKIN: No jaundice NEURO: Patient arousable to sternal rub with some grimacing   Review of Data  I reviewed the following data at the time of this encounter:  Laboratory Studies   Recent Labs  Lab 02/28/19 1048 03/01/19 0530 03/02/19 0654 03/04/19 0542  NA 138 135 135 133*  K 4.8 3.7 3.7 3.8  CL 101 99 99 96*  CO2 24 26 26 24   BUN 93* 59* 69* 83*  CREATININE 5.32* 3.78* 4.24* 4.75*  GLUCOSE 88 77 48* 176*  CALCIUM 8.5* 8.2* 8.3* 8.3*  MG 2.7*  --   --   --   PHOS 4.6 3.5 3.9 4.1   No results for input(s): AST, ALT, GGT, ALKPHOS in the last 168 hours.  Invalid input(s): TBILI, CONJBILI, ALB  Recent Labs  Lab 03/04/19 0542 03/04/19 1811 03/05/19 0705  WBC 7.7 8.8 7.1  HGB 6.7* 8.7* 7.5*  HCT 20.8* 26.3* 22.9*  PLT 207 215 195   No results for input(s): APTT, INR in the last 168 hours.   02/20/2019 07:06 02/21/2019 07:28 02/22/2019 05:25 02/23/2019 05:54 02/25/2019 06:24 02/26/2019 11:39 02/28/2019 13:04 02/28/2019 20:04 03/01/2019 05:30 03/02/2019 06:54 03/03/2019 07:53 03/04/2019 05:42 03/04/2019 18:11 03/05/2019 07:05  Hemoglobin 8.6 (L) 7.5 (L) 7.8 (L) 7.2 (L) 7.1 (L) 7.2 (L) 5.6 (LL) 5.9 (LL) 7.1 (L) 7.4 (L) 7.3 (L) 6.7 (LL) 8.7 (L) 7.5 (L)    Studies  No relevant GI studies in the system and no relevant imaging studies from a GI perspective   Assessment  Mr. Paul Ball is a 73 y.o. male with a pmh significant for COPD, renal failure, heart  failure, diabetes, hypertension, MDD, CVA, GERD, status post trach/PEG.  The GI service is consulted for evaluation and management of transfusion dependent anemia in critically ill patient with positive occult blood testing.  The patient is critically ill and still requiring full support on pressure support/venting.  They have been trying to decrease his vent goals for his issues of respiratory failure.  There is no doubt that he has had a drop in his hemoglobin over the course of the last week and has required transfusions.  No overt GI bleeding other than occult positive blood.  With that being said the degree of blood loss based on his exam and clinical  stability does suggest the possibility of an underlying GI issue.  We will try to get consent from the patient's family on Sunday.  If they agreed to move forward with an endoscopy then we will arrange this most likely for Monday.  He can continue his tube feeds for now.  IV PPI twice daily should be maintained for now.  If patient develops hemodynamic compromise or progressive anemia please let us know if this is before Monday.  We will consider an earlier endoscopy.  I am okay to maintain tube feeds for now.  If we get permission to move forward with an endoscopic evaluation and it performed on Monday by my partner Dr. Tarri Glenn who takes over the service will be the one to perform the procedure.   Plan/Recommendations  Okay for tube feeds for now Plan for tentative EGD on 11/2 if consent obtained from family IV PPI twice daily CBC as per primary medical service Hold Heparin/VTE PPx   Thank you for this consult.  We will continue to follow.  Please page/call with questions or concerns.   Justice Britain, MD [facility] # [phone]

## 2019-03-06 NOTE — Progress Notes (Signed)
Pulmonary Critical Care Medicine [place]   PULMONARY CRITICAL CARE SERVICE  PROGRESS NOTE  Date of Service: 03/06/2019  Paul Ball  [id number]  DOB: 11-19-1945   DOA: 02/15/2019  Referring Physician: [name], MD  HPI: Paul Ball is a 73 y.o. male seen for follow up of Acute on Chronic Respiratory Failure.  Patient right now is on assist control mode on 45% FiO2 with tidal line 525 and PEEP 7  Medications: Reviewed on Rounds  Physical Exam:  Vitals: Temperature 97.7 pulse 79 respiratory 22 blood pressure 172/56 saturations 96%  Ventilator Settings mode ventilation assist control FiO2 45% tidal line 525 PEEP 7  . General: Comfortable at this time . Eyes: Grossly normal lids, irises & conjunctiva . ENT: grossly tongue is normal . Neck: no obvious mass . Cardiovascular: S1 S2 normal no gallop . Respiratory: No rhonchi coarse breath sounds . Abdomen: soft . Skin: no rash seen on limited exam . Musculoskeletal: not rigid . Psychiatric:unable to assess . Neurologic: no seizure no involuntary movements         Lab Data:   Basic Metabolic Panel: Recent Labs  Lab 02/28/19 1048 03/01/19 0530 03/02/19 0654 03/04/19 0542  NA 138 135 135 133*  K 4.8 3.7 3.7 3.8  CL 101 99 99 96*  CO2 24 26 26 24   GLUCOSE 88 77 48* 176*  BUN 93* 59* 69* 83*  CREATININE 5.32* 3.78* 4.24* 4.75*  CALCIUM 8.5* 8.2* 8.3* 8.3*  MG 2.7*  --   --   --   PHOS 4.6 3.5 3.9 4.1    ABG: No results for input(s): PHART, PCO2ART, PO2ART, HCO3, O2SAT in the last 168 hours.  Liver Function Tests: Recent Labs  Lab 03/01/19 0530 03/02/19 0654 03/04/19 0542  ALBUMIN 1.6* 1.5* 1.5*   No results for input(s): LIPASE, AMYLASE in the last 168 hours. No results for input(s): AMMONIA in the last 168 hours.  CBC: Recent Labs  Lab 03/03/19 0753 03/04/19 0542 03/04/19 1811 03/05/19 0705 03/06/19 0812  WBC 8.5 7.7 8.8 7.1 7.6  HGB 7.3* 6.7* 8.7* 7.5* 7.3*  HCT  22.7* 20.8* 26.3* 22.9* 22.3*  MCV 87.6 87.0 83.8 84.8 83.5  PLT 218 207 215 195 184    Cardiac Enzymes: No results for input(s): CKTOTAL, CKMB, CKMBINDEX, TROPONINI in the last 168 hours.  BNP (last 3 results) No results for input(s): BNP in the last 8760 hours.  ProBNP (last 3 results) No results for input(s): PROBNP in the last 8760 hours.  Radiological Exams: No results found.  Assessment/Plan Active Problems:   Acute on chronic respiratory failure with hypoxia (H[facility])   Acute on chronic systolic and diastolic heart failure, NYHA class 3 ([facility])   Acute renal failure superimposed on stage 3 chronic kidney disease ([facility])   Healthcare-associated pneumonia   COPD, severe ([place])   1. Acute on chronic respiratory failure with hypoxia we will continue with full vent support patient was attempted weaning did not pass the trial 2. Acute systolic heart failure compensated 3. Acute renal failure continue supportive care nephrology is following 4. Healthcare associated pneumonia treated 5. Severe COPD at baseline continue present management   I have personally seen and evaluated the patient, evaluated laboratory and imaging results, formulated the assessment and plan and placed orders. The Patient requires high complexity decision making for assessment and support.  Case was discussed on Rounds with the Respiratory Therapy Staff  Allyne Gee, MD [facility] Pulmonary Critical Care Medicine Sleep Medicine

## 2019-03-06 NOTE — Anesthesia Preprocedure Evaluation (Addendum)
Anesthesia Evaluation  Patient identified by MRN, date of birth, ID band Patient unresponsive    Reviewed: Allergy & Precautions, NPO status , Patient's Chart, lab work & pertinent test results  Airway Mallampati: Trach       Dental   Pulmonary COPD,  VDRF   + rhonchi  + decreased breath sounds      Cardiovascular hypertension, +CHF   Rhythm:Regular Rate:Normal  1. Left ventricular ejection fraction, by visual estimation, is 50 to 55%. The left ventricle has normal function. Normal left ventricular size. Mildly increased left ventricular posterior wall thickness. There is mildly increased left ventricular  hypertrophy.  2. Left ventricular diastolic Doppler parameters are consistent with pseudonormalization pattern of LV diastolic filling.  3. Global right ventricle has normal systolic function.The right ventricular size is normal. Mildly increased right ventricular wall thickness.  4. Left atrial size was moderately dilated.  5. Right atrial size was mildly dilated.  6. Large pleural effusion in both left and right lateral regions.  7. The pericardial effusion is posterior to the left ventricle.  8. Trivial pericardial effusion is present.  9. Moderate aortic valve annular calcification. 10. The mitral valve is degenerative. Mild mitral valve regurgitation. No evidence of mitral stenosis. 11. The tricuspid valve is normal in structure. Tricuspid valve regurgitation is mild. 12. The aortic valve is tricuspid Aortic valve regurgitation is mild by color flow Doppler. Mild to moderate aortic valve stenosis. 13. There is Mild thickening of the aortic valve. 14. There is Severe calcifcation of the aortic valve. 15. The pulmonic valve was normal in structure. Pulmonic valve regurgitation is trivial by color flow Doppler. 16. The inferior vena cava is normal in size with greater than 50% respiratory variability, suggesting right atrial  pressure of 3 mmHg. 62. Small patent foramen ovale with predominantly right to left shunting across the atrial septum. 18. Evidence of atrial level shunting detected by color flow Doppler.    Neuro/Psych negative neurological ROS     GI/Hepatic negative GI ROS, Neg liver ROS,   Endo/Other  diabetes  Renal/GU DialysisRenal disease     Musculoskeletal negative musculoskeletal ROS (+)   Abdominal   Peds  Hematology negative hematology ROS (+)   Anesthesia Other Findings Day of surgery medications reviewed with the patient.  Reproductive/Obstetrics                           Anesthesia Physical Anesthesia Plan  ASA: IV  Anesthesia Plan: MAC   Post-op Pain Management:    Induction:   PONV Risk Score and Plan: 2 and Ondansetron and Propofol infusion  Airway Management Planned: Tracheostomy  Additional Equipment:   Intra-op Plan:   Post-operative Plan:   Informed Consent: I have reviewed the patients History and Physical, chart, labs and discussed the procedure including the risks, benefits and alternatives for the proposed anesthesia with the patient or authorized representative who has indicated his/her understanding and acceptance.       Plan Discussed with:   Anesthesia Plan Comments:        Anesthesia Quick Evaluation

## 2019-03-07 ENCOUNTER — Encounter (HOSPITAL_COMMUNITY): Payer: Self-pay | Admitting: *Deleted

## 2019-03-07 ENCOUNTER — Encounter
Admission: RE | Disposition: A | Discharge status: Skilled Nursing Facility | Payer: Self-pay | Source: Other Acute Inpatient Hospital | Attending: Internal Medicine

## 2019-03-07 ENCOUNTER — Encounter (HOSPITAL_COMMUNITY): Payer: Medicare Other | Admitting: Anesthesiology

## 2019-03-07 DIAGNOSIS — I5043 Acute on chronic combined systolic (congestive) and diastolic (congestive) heart failure: Secondary | ICD-10-CM | POA: Diagnosis not present

## 2019-03-07 DIAGNOSIS — J449 Chronic obstructive pulmonary disease, unspecified: Secondary | ICD-10-CM | POA: Diagnosis not present

## 2019-03-07 DIAGNOSIS — J9621 Acute and chronic respiratory failure with hypoxia: Secondary | ICD-10-CM | POA: Diagnosis not present

## 2019-03-07 DIAGNOSIS — K2960 Other gastritis without bleeding: Secondary | ICD-10-CM

## 2019-03-07 DIAGNOSIS — N179 Acute kidney failure, unspecified: Secondary | ICD-10-CM | POA: Diagnosis not present

## 2019-03-07 DIAGNOSIS — D500 Iron deficiency anemia secondary to blood loss (chronic): Secondary | ICD-10-CM | POA: Insufficient documentation

## 2019-03-07 DIAGNOSIS — K269 Duodenal ulcer, unspecified as acute or chronic, without hemorrhage or perforation: Secondary | ICD-10-CM | POA: Insufficient documentation

## 2019-03-07 HISTORY — PX: BIOPSY: SHX5522

## 2019-03-07 HISTORY — PX: ESOPHAGOGASTRODUODENOSCOPY: SHX5428

## 2019-03-07 LAB — POCT I-STAT, CHEM 8
BUN: 102 mg/dL — ABNORMAL HIGH (ref 8–23)
Calcium, Ion: 1.15 mmol/L (ref 1.15–1.40)
Chloride: 91 mmol/L — ABNORMAL LOW (ref 98–111)
Creatinine, Ser: 4.7 mg/dL — ABNORMAL HIGH (ref 0.61–1.24)
Glucose, Bld: 127 mg/dL — ABNORMAL HIGH (ref 70–99)
HCT: 23 % — ABNORMAL LOW (ref 39.0–52.0)
Hemoglobin: 7.8 g/dL — ABNORMAL LOW (ref 13.0–17.0)
Potassium: 3.8 mmol/L (ref 3.5–5.1)
Sodium: 129 mmol/L — ABNORMAL LOW (ref 135–145)
TCO2: 25 mmol/L (ref 22–32)

## 2019-03-07 LAB — CBC
HCT: 21.4 % — ABNORMAL LOW (ref 39.0–52.0)
Hemoglobin: 7 g/dL — ABNORMAL LOW (ref 13.0–17.0)
MCH: 27.5 pg (ref 26.0–34.0)
MCHC: 32.7 g/dL (ref 30.0–36.0)
MCV: 83.9 fL (ref 80.0–100.0)
Platelets: 183 10*3/uL (ref 150–400)
RBC: 2.55 MIL/uL — ABNORMAL LOW (ref 4.22–5.81)
RDW: 15.9 % — ABNORMAL HIGH (ref 11.5–15.5)
WBC: 7.6 10*3/uL (ref 4.0–10.5)
nRBC: 0 % (ref 0.0–0.2)

## 2019-03-07 LAB — RENAL FUNCTION PANEL
Albumin: 1.5 g/dL — ABNORMAL LOW (ref 3.5–5.0)
Anion gap: 12 (ref 5–15)
BUN: 89 mg/dL — ABNORMAL HIGH (ref 8–23)
CO2: 23 mmol/L (ref 22–32)
Calcium: 7.9 mg/dL — ABNORMAL LOW (ref 8.9–10.3)
Chloride: 94 mmol/L — ABNORMAL LOW (ref 98–111)
Creatinine, Ser: 4.55 mg/dL — ABNORMAL HIGH (ref 0.61–1.24)
GFR calc Af Amer: 14 mL/min — ABNORMAL LOW (ref >60)
GFR calc non Af Amer: 12 mL/min — ABNORMAL LOW (ref >60)
Glucose, Bld: 166 mg/dL — ABNORMAL HIGH (ref 70–99)
Phosphorus: 3.5 mg/dL (ref 2.5–4.6)
Potassium: 4 mmol/L (ref 3.5–5.1)
Sodium: 129 mmol/L — ABNORMAL LOW (ref 135–145)

## 2019-03-07 LAB — PREPARE RBC (CROSSMATCH)

## 2019-03-07 LAB — SARS CORONAVIRUS 2 (TAT 6-24 HRS): SARS Coronavirus 2: NEGATIVE

## 2019-03-07 SURGERY — EGD (ESOPHAGOGASTRODUODENOSCOPY)
Anesthesia: Monitor Anesthesia Care

## 2019-03-07 MED ORDER — VASOPRESSIN 20 UNIT/ML IV SOLN
INTRAVENOUS | Status: DC | PRN
  Administered 2019-03-07: 2 [IU] via INTRAVENOUS

## 2019-03-07 MED ORDER — ALBUMIN HUMAN 5 % IV SOLN
INTRAVENOUS | Status: DC | PRN
  Administered 2019-03-07: 14:00:00 via INTRAVENOUS

## 2019-03-07 MED ORDER — PHENYLEPHRINE HCL (PRESSORS) 10 MG/ML IV SOLN
INTRAVENOUS | Status: DC | PRN
  Administered 2019-03-07: 80 ug via INTRAVENOUS

## 2019-03-07 MED ORDER — LACTATED RINGERS IV SOLN
INTRAVENOUS | Status: DC | PRN
  Administered 2019-03-07: 14:00:00 via INTRAVENOUS

## 2019-03-07 MED ORDER — SODIUM CHLORIDE 0.9 % IV SOLN
INTRAVENOUS | Status: DC | PRN
  Administered 2019-03-07: 15:00:00 via INTRAVENOUS

## 2019-03-07 MED ORDER — EPHEDRINE SULFATE 50 MG/ML IJ SOLN
INTRAMUSCULAR | Status: DC | PRN
  Administered 2019-03-07: 10 mg via INTRAVENOUS

## 2019-03-07 NOTE — Interval H&P Note (Signed)
History and Physical Interval Note:  03/07/2019 1:35 PM  Paul Ball  has presented today for surgery, with the diagnosis of Transfusion dependent Anemia & +FOBT.  The various methods of treatment have been discussed with the patient and family. After consideration of risks, benefits and other options for treatment, the patient has consented to  Procedure(s): ESOPHAGOGASTRODUODENOSCOPY (EGD) (N/A) as a surgical intervention.  The patient's history has been reviewed, patient examined, no change in status, stable for surgery.  I have reviewed the patient's chart and labs.  Questions were answered to the patient's satisfaction.     [place]

## 2019-03-07 NOTE — Transfer of Care (Signed)
Immediate Anesthesia Transfer of Care Note  Patient: Paul Ball  Procedure(s) Performed: ESOPHAGOGASTRODUODENOSCOPY (EGD) (N/A ) BIOPSY  Patient Location[facility]   Anesthesia Type:General  Level of Consciousness: drowsy and Patient remains intubated per anesthesia plan  Airway & Oxygen Therapy: Patient remains intubated per anesthesia plan and Patient placed on Ventilator (see vital sign flow sheet for setting)  Post-op Assessment: Report given to RN and Post -op Vital signs reviewed and stable  Post vital signs: Reviewed and stable  Last Vitals:  Vitals Value Taken Time  BP    Temp    Pulse    Resp    SpO2      Last Pain:  Vitals:   03/07/19 1409  TempSrc: Temporal         Complications: No apparent anesthesia complications

## 2019-03-07 NOTE — Op Note (Signed)
[facility] Patient Name: Paul Ball Procedure Date : 03/07/2019 MRN: [id number] Attending MD: Thornton Park MD, MD Date of Birth: 20-Jun-1945 CSN: [id number] Age: 73 Admit Type: Inpatient Procedure:                Upper GI endoscopy Indications:              Heme positive stool, Recent gastrointestinal                            bleeding.                           Underlying anemia of chronic kidney disease.                            Requiring recurrent PRBC transfusion. Now with                            guaiac + stools. Providers:                Thornton Park MD, MD, Vista Lawman, RN, Janie                            Billups, Patton Salles CRNA Referring MD:              Medicines:                Monitored Anesthesia Care Complications:            No immediate complications. Estimated blood loss:                            Minimal. Estimated Blood Loss:     Estimated blood loss was minimal. Procedure:                Pre-Anesthesia Assessment:                           - Prior to the procedure, a History and Physical                            was performed, and patient medications and                            allergies were reviewed. The patient's tolerance of                            previous anesthesia was also reviewed. The risks                            and benefits of the procedure and the sedation                            options and risks were discussed with the patient.                            All questions were answered, and informed consent  was obtained. Prior Anticoagulants: The patient has                            taken heparin. ASA Grade Assessment: III - A                            patient with severe systemic disease. After                            reviewing the risks and benefits, the patient was                            deemed in satisfactory condition to undergo the              procedure.                           After obtaining informed consent, the endoscope was                            passed under direct vision. Throughout the                            procedure, the patient's blood pressure, pulse, and                            oxygen saturations were monitored continuously. The                            [id number] Olympus gastroscope was                            introduced through the mouth, and advanced to the                            third part of duodenum. The upper GI endoscopy was                            accomplished without difficulty. The patient                            tolerated the procedure well. Scope In: Scope Out: Findings:      The esophagus was normal.      G tube present. No associated ulceration. No buried bumper syndrome.       Diffuse prominent gastric folds were found in the gastric fundus. There       is one patch of mild erythema in the fundus. This may be due to artifact       as I could not fully insufflate the stomach given his G-tube. Biopsies       were taken with a cold forceps from the antrum and fundus for histology.       No blood or hematin seen in the stomach.      One non-bleeding cratered duodenal ulcer with no stigmata of bleeding       was found in the  duodenal bulb. The lesion was 3 mm in largest       dimension. Biopsies were taken with a cold forceps for histology. I       advanced the gastroscope to the scope head. No blood or hematin seen in       the duodenum. No other mucosal abnormalities identified. Impression:               - Normal esophagus.                           - G tube present.                           - Enlarged gastric folds. May be artifact due to                            incompletely insufflated stomach in the setting of                            G-tube. Biopsied including specimens for H pylori.                           - Non-bleeding duodenal ulcer with  no stigmata of                            bleeding. Biopsied. Recommendation:           - May resume enteral feeds.                           - Continue present medications including Protonix                            40 mg IV twice daily.                           - Await pathology results.                           - Avoid all NSAIDs and anticoagulants as able.                           - Repeat upper endoscopy in 8 weeks to check for                            ulcer healing and reassess the gastric folds.                           - Treat non-GI causes of chronic anemia.                           - Continue serial hgb/hct with transfusion as                            necessary.  I updated the patient's daughter and mother with                            the procedure results and my recommendations by                            phone today. All questions were answered to their                            satisfaction. Procedure Code(s):        --- Professional ---                           [phone], Esophagogastroduodenoscopy, flexible,                            transoral; with biopsy, single or multiple Diagnosis Code(s):        --- Professional ---                           K29.60, Other gastritis without bleeding                           K26.9, Duodenal ulcer, unspecified as acute or                            chronic, without hemorrhage or perforation                           R19.5, Other fecal abnormalities                           K92.2, Gastrointestinal hemorrhage, unspecified CPT copyright 2019 American [employer]. All rights reserved. The codes documented in this report are preliminary and upon coder review may  be revised to meet current compliance requirements. Thornton Park MD, MD 03/07/2019 3:17:18 PM This report has been signed electronically. Number of Addenda: 0

## 2019-03-07 NOTE — Progress Notes (Signed)
Pulmonary Critical Care Medicine [place]   PULMONARY CRITICAL CARE SERVICE  PROGRESS NOTE  Date of Service: 03/07/2019  Yohanes Kobs  [id number]  DOB: 09-Sep-1945   DOA: 02/15/2019  Referring Physician: [name], MD  HPI: Dhruv Gusmano is a 73 y.o. male seen for follow up of Acute on Chronic Respiratory Failure.  Patient is on full support right now was attempted on pressure support did not tolerated  Medications: Reviewed on Rounds  Physical Exam:  Vitals: Temperature 97.4 pulse 89 respiratory rate 30 blood pressure 161/67 saturations 98%  Ventilator Settings mode ventilation assist control FiO2 40% tidal line 420 PEEP 7  . General: Comfortable at this time . Eyes: Grossly normal lids, irises & conjunctiva . ENT: grossly tongue is normal . Neck: no obvious mass . Cardiovascular: S1 S2 normal no gallop . Respiratory: No rhonchi no rales are noted at this time . Abdomen: soft . Skin: no rash seen on limited exam . Musculoskeletal: not rigid . Psychiatric:unable to assess . Neurologic: no seizure no involuntary movements         Lab Data:   Basic Metabolic Panel: Recent Labs  Lab 03/01/19 0530 03/02/19 0654 03/04/19 0542 03/07/19 0749  NA 135 135 133* 129*  K 3.7 3.7 3.8 4.0  CL 99 99 96* 94*  CO2 26 26 24 23   GLUCOSE 77 48* 176* 166*  BUN 59* 69* 83* 89*  CREATININE 3.78* 4.24* 4.75* 4.55*  CALCIUM 8.2* 8.3* 8.3* 7.9*  PHOS 3.5 3.9 4.1 3.5    ABG: No results for input(s): PHART, PCO2ART, PO2ART, HCO3, O2SAT in the last 168 hours.  Liver Function Tests: Recent Labs  Lab 03/01/19 0530 03/02/19 0654 03/04/19 0542 03/07/19 0749  ALBUMIN 1.6* 1.5* 1.5* 1.5*   No results for input(s): LIPASE, AMYLASE in the last 168 hours. No results for input(s): AMMONIA in the last 168 hours.  CBC: Recent Labs  Lab 03/04/19 0542 03/04/19 1811 03/05/19 0705 03/06/19 0812 03/07/19 0749  WBC 7.7 8.8 7.1 7.6 7.6  HGB 6.7* 8.7* 7.5*  7.3* 7.0*  HCT 20.8* 26.3* 22.9* 22.3* 21.4*  MCV 87.0 83.8 84.8 83.5 83.9  PLT 207 215 195 184 183    Cardiac Enzymes: No results for input(s): CKTOTAL, CKMB, CKMBINDEX, TROPONINI in the last 168 hours.  BNP (last 3 results) No results for input(s): BNP in the last 8760 hours.  ProBNP (last 3 results) No results for input(s): PROBNP in the last 8760 hours.  Radiological Exams: No results found.  Assessment/Plan Active Problems:   Acute on chronic respiratory failure with hypoxia (HCC)   Acute on chronic systolic and diastolic heart failure, NYHA class 3 (HCC)   Acute renal failure superimposed on stage 3 chronic kidney disease (HCC)   Healthcare-associated pneumonia   COPD, severe ([place])   1. Acute on chronic respiratory failure hypoxia plan is to continue with full vent support is attempted on pressure support but did not tolerate will have respiratory therapy reassess 2. Acute on chronic diastolic heart failure monitor fluid status patient is also going to have a GI work-up done apparently today 3. Healthcare associated pneumonia treated 4. Severe COPD at baseline continue present management   I have personally seen and evaluated the patient, evaluated laboratory and imaging results, formulated the assessment and plan and placed orders. The Patient requires high complexity decision making for assessment and support.  Case was discussed on Rounds with the Respiratory Therapy Staff  Allyne Gee, MD [facility] Pulmonary Critical  Care Medicine Sleep Medicine

## 2019-03-07 NOTE — Progress Notes (Signed)
[facility]  ROUNDING NOTE   Subjective:  Patient seen and evaluated at bedside. Still on the ventilator. Due for EGD today.    Objective:  Vital signs in last 24 hours:  Temperature 97.9 pulse 89 respirations 30 blood pressure 161/67  Physical Exam: General: Critically ill-appearing  Head: Normocephalic, atraumatic. Moist oral mucosal membranes  Eyes: Anicteric  Neck: Tracheostomy in place  Lungs:  Scattered rhonchi, vent assisted  Heart: S1S2 no rubs  Abdomen:  Soft, nontender, bowel sounds present, PEG in place  Extremities: 2+ peripheral edema.  Neurologic: Not following commands  Skin: No rash  Access: Right internal jugular temporary dialysis catheter    Basic Metabolic Panel: Recent Labs  Lab 02/28/19 1048 03/01/19 0530 03/02/19 0654 03/04/19 0542  NA 138 135 135 133*  K 4.8 3.7 3.7 3.8  CL 101 99 99 96*  CO2 _0 GLUCOSE 88 77 48* 176*  BUN 93* 59* 69* 83*  CREATININE 5.32* 3.78* 4.24* 4.75*  CALCIUM 8.5* 8.2* 8.3* 8.3*  MG 2.7*  --   --   --   PHOS 4.6 3.5 3.9 4.1    Liver Function Tests: Recent Labs  Lab 03/01/19 0530 03/02/19 0654 03/04/19 0542  ALBUMIN 1.6* 1.5* 1.5*   No results for input(s): LIPASE, AMYLASE in the last 168 hours. No results for input(s): AMMONIA in the last 168 hours.  CBC: Recent Labs  Lab 03/03/19 0753 03/04/19 0542 03/04/19 1811 03/05/19 0705 03/06/19 0812  WBC 8.5 7.7 8.8 7.1 7.6  HGB 7.3* 6.7* 8.7* 7.5* 7.3*  HCT 22.7* 20.8* 26.3* 22.9* 22.3*  MCV 87.6 87.0 83.8 84.8 83.5  PLT 218 207 215 195 184    Cardiac Enzymes: No results for input(s): CKTOTAL, CKMB, CKMBINDEX, TROPONINI in the last 168 hours.  BNP: Invalid input(s): POCBNP  CBG: No results for input(s): GLUCAP in the last 168 hours.  Microbiology: Results for orders placed or performed during the hospital encounter of 02/15/19  Culture, Urine     Status: None   Collection Time: 02/15/19 12:52 AM   Specimen: Urine,  Random  Result Value Ref Range Status   Specimen Description URINE, RANDOM  Final   Special Requests NONE  Final   Culture   Final    NO GROWTH Performed at [facility], [place] [address] [place], [place] [address]    Report Status 02/17/2019 FINAL  Final  Culture, respiratory (non-expectorated)     Status: None   Collection Time: 02/15/19 10:50 PM   Specimen: Tracheal Aspirate; Respiratory  Result Value Ref Range Status   Specimen Description TRACHEAL ASPIRATE  Final   Special Requests NONE  Final   Gram Stain   Final    NO WBC SEEN RARE GRAM POSITIVE COCCI IN PAIRS Performed at [facility], [address] [place], [place] [address]    Culture MODERATE ENTEROCOCCUS FAECALIS  Final   Report Status 02/19/2019 FINAL  Final   Organism ID, Bacteria ENTEROCOCCUS FAECALIS  Final      Susceptibility   Enterococcus faecalis - MIC*    AMPICILLIN <=2 SENSITIVE Sensitive     VANCOMYCIN 1 SENSITIVE Sensitive     GENTAMICIN SYNERGY RESISTANT Resistant     * MODERATE ENTEROCOCCUS FAECALIS  SARS Coronavirus 2 by RT PCR (hospital order, performed in [place] hospital lab) Nasopharyngeal Nasopharyngeal Swab     Status: None   Collection Time: 02/16/19  2:20 PM   Specimen: Nasopharyngeal Swab  Result  Value Ref Range Status   SARS Coronavirus 2 NEGATIVE NEGATIVE Final    Comment: (NOTE) If result is NEGATIVE SARS-CoV-2 target nucleic acids are NOT DETECTED. The SARS-CoV-2 RNA is generally detectable in upper and lower  respiratory specimens during the acute phase of infection. The lowest  concentration of SARS-CoV-2 viral copies this assay can detect is 250  copies / mL. A negative result does not preclude SARS-CoV-2 infection  and should not be used as the sole basis for treatment or other  patient management decisions.  A negative result may occur with  improper specimen collection / handling, submission of specimen other  than nasopharyngeal swab, presence of viral  mutation(s) within the  areas targeted by this assay, and inadequate number of viral copies  (<250 copies / mL). A negative result must be combined with clinical  observations, patient history, and epidemiological information. If result is POSITIVE SARS-CoV-2 target nucleic acids are DETECTED. The SARS-CoV-2 RNA is generally detectable in upper and lower  respiratory specimens dur ing the acute phase of infection.  Positive  results are indicative of active infection with SARS-CoV-2.  Clinical  correlation with patient history and other diagnostic information is  necessary to determine patient infection status.  Positive results do  not rule out bacterial infection or co-infection with other viruses. If result is PRESUMPTIVE POSTIVE SARS-CoV-2 nucleic acids MAY BE PRESENT.   A presumptive positive result was obtained on the submitted specimen  and confirmed on repeat testing.  While 2019 novel coronavirus  (SARS-CoV-2) nucleic acids may be present in the submitted sample  additional confirmatory testing may be necessary for epidemiological  and / or clinical management purposes  to differentiate between  SARS-CoV-2 and other Sarbecovirus currently known to infect humans.  If clinically indicated additional testing with an alternate test  methodology [phone]) is advised. The SARS-CoV-2 RNA is generally  detectable in upper and lower respiratory sp ecimens during the acute  phase of infection. The expected result is Negative. Fact Sheet for Patients:  StrictlyIdeas.no Fact Sheet for Healthcare Providers: BankingDealers.co.za This test is not yet approved or cleared by the [place] FDA and has been authorized for detection and/or diagnosis of SARS-CoV-2 by FDA under an Emergency Use Authorization (EUA).  This EUA will remain in effect (meaning this test can be used) for the duration of the COVID-19 declaration under Section 564(b)(1)  of the Act, 21 U.S.C. section 360bbb-3(b)(1), unless the authorization is terminated or revoked sooner. Performed at [facility], [place] [address] [place], [place] [address]     Coagulation Studies: No results for input(s): LABPROT, INR in the last 72 hours.  Urinalysis: No results for input(s): COLORURINE, LABSPEC, PHURINE, GLUCOSEU, HGBUR, BILIRUBINUR, KETONESUR, PROTEINUR, UROBILINOGEN, NITRITE, LEUKOCYTESUR in the last 72 hours.  Invalid input(s): APPERANCEUR    Imaging: No results found.   Medications:     heparin, iohexol, lidocaine  Assessment/ Plan:  73 y.o. male with a PMHx of BPH, allergic rhinitis, COPD, diabetes mellitus type 2, hypertension, GERD, acute respiratory failure, congestive heart failure, chronic kidney disease stage IIIb EGFR 43 who was admitted to [facility] on 02/15/2019 for ongoing management of acute respiratory failure.   1.  Acute kidney injury/chronic kidney disease stage IIIb baseline GFR 43.    Patient due for hemodialysis today.  We will plan for dialysis after EGD.  2.  Acute respiratory failure.  Patient still requiring ventilatory support.  FiO2 remained stable at 45% with a PEEP of 5.  3.  Anemia of chronic kidney disease.  Last week hemoglobin was 6.7 and he was transfused.  Hemoglobin did peak at 8.7 but still drifting down and currently 7.3.  Patient due for EGD today.  4.  Secondary hyperparathyroidism.  Repeat serum phosphorus today.   LOS: 0 Rethel Sebek 11/2/20208:43 AM

## 2019-03-08 ENCOUNTER — Telehealth: Payer: Self-pay

## 2019-03-08 ENCOUNTER — Other Ambulatory Visit (HOSPITAL_COMMUNITY): Payer: Medicare Other

## 2019-03-08 DIAGNOSIS — J449 Chronic obstructive pulmonary disease, unspecified: Secondary | ICD-10-CM | POA: Diagnosis not present

## 2019-03-08 DIAGNOSIS — D500 Iron deficiency anemia secondary to blood loss (chronic): Secondary | ICD-10-CM

## 2019-03-08 DIAGNOSIS — J9621 Acute and chronic respiratory failure with hypoxia: Secondary | ICD-10-CM | POA: Diagnosis not present

## 2019-03-08 DIAGNOSIS — N179 Acute kidney failure, unspecified: Secondary | ICD-10-CM | POA: Diagnosis not present

## 2019-03-08 DIAGNOSIS — I5043 Acute on chronic combined systolic (congestive) and diastolic (congestive) heart failure: Secondary | ICD-10-CM | POA: Diagnosis not present

## 2019-03-08 LAB — CBC
HCT: 33.4 % — ABNORMAL LOW (ref 39.0–52.0)
Hemoglobin: 11.2 g/dL — ABNORMAL LOW (ref 13.0–17.0)
MCH: 28.8 pg (ref 26.0–34.0)
MCHC: 33.5 g/dL (ref 30.0–36.0)
MCV: 85.9 fL (ref 80.0–100.0)
Platelets: 172 10*3/uL (ref 150–400)
RBC: 3.89 MIL/uL — ABNORMAL LOW (ref 4.22–5.81)
RDW: 15.4 % (ref 11.5–15.5)
WBC: 8.4 10*3/uL (ref 4.0–10.5)
nRBC: 0 % (ref 0.0–0.2)

## 2019-03-08 LAB — BPAM RBC
Blood Product Expiration Date: 202011082359
Blood Product Expiration Date: 202011172359
Blood Product Expiration Date: 202011262359
Blood Product Expiration Date: 202011262359
ISSUE DATE / TIME: 202010301227
ISSUE DATE / TIME: 202011021105
ISSUE DATE / TIME: 202011021433
ISSUE DATE / TIME: 202011021446
Unit Type and Rh: 7300
Unit Type and Rh: 7300
Unit Type and Rh: 7300
Unit Type and Rh: 7300

## 2019-03-08 LAB — TYPE AND SCREEN
ABO/RH(D): B POS
ABO/RH(D): B POS
Antibody Screen: NEGATIVE
Antibody Screen: NEGATIVE
Unit division: 0
Unit division: 0
Unit division: 0
Unit division: 0

## 2019-03-08 LAB — BLOOD GAS, ARTERIAL
Acid-Base Excess: 1.8 mmol/L (ref 0.0–2.0)
Bicarbonate: 25.9 mmol/L (ref 20.0–28.0)
FIO2: 55
O2 Saturation: 99.6 %
Patient temperature: 37
pCO2 arterial: 40.5 mmHg (ref 32.0–48.0)
pH, Arterial: 7.421 (ref 7.350–7.450)
pO2, Arterial: 180 mmHg — ABNORMAL HIGH (ref 83.0–108.0)

## 2019-03-08 NOTE — Anesthesia Postprocedure Evaluation (Signed)
Anesthesia Post Note  Patient: Paul Ball  Procedure(s) Performed: ESOPHAGOGASTRODUODENOSCOPY (EGD) (N/A ) BIOPSY     Patient location during evaluation: Nursing Unit Anesthesia Type: General Level of consciousness: sedated and patient remains intubated per anesthesia plan Pain management: pain level controlled Vital Signs Assessment: post-procedure vital signs reviewed and stable Respiratory status: patient remains intubated per anesthesia plan and patient on ventilator - see flowsheet for VS Cardiovascular status: stable Postop Assessment: no apparent nausea or vomiting Anesthetic complications: no    Last Vitals:  Vitals:   03/07/19 1409  BP: (!) 116/40  Pulse: 73  Resp: 12  Temp: 36.6 C  SpO2: 100%    Last Pain:  Vitals:   03/07/19 1409  TempSrc: Temporal                 Vendetta Pittinger COKER

## 2019-03-08 NOTE — Telephone Encounter (Signed)
Appt made for 04/21/19 at 1130 am to see Dr Rush Landmark in clinic.

## 2019-03-08 NOTE — Progress Notes (Signed)
     Progress Note    ASSESSMENT AND PLAN:   73 yo male with COPD, renal failure, DM, CVA, s/p PEG/ trach with heme positive anemia. EGD yesterday >> non-bleeding duodenal ulcer, enlarged gastric folds of unclear clinical significance but biopsies pending.  -Post 2 uPRBC yesterday. Hgb 7.8 >>> 11.2. Overzealous response, expect hgb to settle somewhere around 10.  -Awaiting stomach biopsies -Continue BID PPI -Will need follow up EGD in ~ 8 weeks.  -Avoid NSAIDs. / Anticoagulants where possible.    OBJECTIVE:    EGD 03/07/19 -Enlarged gastric folds. May be artifact due to incompletely insufflated stomach in the setting of G-tube. Biopsied including specimens for H pylori. - Non-bleeding duodenal ulcer with no stigmata of bleeding. Biopsied.  Vital signs in last 24 hours: Temp:  [97.8 F (36.6 C)] 97.8 F (36.6 C) (11/02 1409) Pulse Rate:  [73] 73 (11/02 1409) Resp:  [12] 12 (11/02 1409) BP: (116)/(40) 116/40 (11/02 1409) SpO2:  [100 %] 100 % (11/02 1409)   General:   Alert, in NAD Heart:  Regular rate and rhythm. Anasarca  Pulm: trach on vent Abdomen:  Soft, nondistended, nontender.  Normal bowel sounds.    Significant subcutaneous fluid      Neurologic:  Alert   Intake/Output from previous day: 11/02 0701 - 11/03 0700 In: 1480 [I.V.:600; Blood:630; IV Piggyback:250] Out: 10 [Blood:10] Intake/Output this shift: No intake/output data recorded.  Lab Results: Recent Labs    03/06/19 0812 03/07/19 0749 03/07/19 1423 03/08/19 0536  WBC 7.6 7.6  --  8.4  HGB 7.3* 7.0* 7.8* 11.2*  HCT 22.3* 21.4* 23.0* 33.4*  PLT 184 183  --  172   BMET Recent Labs    03/07/19 0749 03/07/19 1423  NA 129* 129*  K 4.0 3.8  CL 94* 91*  CO2 23  --   GLUCOSE 166* 127*  BUN 89* 102*  CREATININE 4.55* 4.70*  CALCIUM 7.9*  --    LFT Recent Labs    03/07/19 0749  ALBUMIN 1.5*    Dg Chest Port 1 View  Result Date: 03/08/2019 CLINICAL DATA:  Congestive heart failure. EXAM:  PORTABLE CHEST 1 VIEW COMPARISON:  Chest x-rays dated 03/02/2019 and 02/24/2019. FINDINGS: Tracheostomy tube remains appropriately positioned in the midline. RIGHT IJ central line appears well positioned with tip at the level of the mid/lower SVC. Stable diffuse bilateral airspace opacities, incompletely imaged at the lung bases on today's exam. Probable bilateral pleural effusions, small to moderate in size. No pneumothorax seen. IMPRESSION: 1. Stable diffuse bilateral airspace opacities, compatible with pulmonary edema pattern, incompletely imaged at the lung bases on today's exam. 2. Probable small to moderate bilateral pleural effusions. Electronically Signed   By: Franki Cabot M.D.   On: 03/08/2019 10:02      Active Problems:   Acute on chronic respiratory failure with hypoxia (HCC)   Acute on chronic systolic and diastolic heart failure, NYHA class 3 (HCC)   Acute renal failure superimposed on stage 3 chronic kidney disease (HCC)   Healthcare-associated pneumonia   COPD, severe (HCC)   Duodenal ulcer   Anemia due to GI blood loss     LOS: 0 days   Tye Savoy ,NP 03/08/2019, 11:15 AM

## 2019-03-08 NOTE — Progress Notes (Signed)
Pulmonary Critical Care Medicine [place]   PULMONARY CRITICAL CARE SERVICE  PROGRESS NOTE  Date of Service: 03/08/2019  Paul Ball  [name]  DOB: [birth year]   DOA: 02/15/2019  Referring Physician: [name], MD  HPI: Paul Ball is a 73 y.o. male seen for follow up of Acute on Chronic Respiratory Failure.  Patient underwent endoscopy noted to have a nonbleeding ulcer noted.  Patient still having issues with oxygen requirements right now is on 55% FiO2 with not being able to wean  Medications: Reviewed on Rounds  Physical Exam:  Vitals: Temperature 97.0 pulse 63 respiratory 24 blood pressure 132/68 saturations 95%  Ventilator Settings mode ventilation assist control FiO2 55% tidal volume 444 PEEP 7  . General: Comfortable at this time . Eyes: Grossly normal lids, irises & conjunctiva . ENT: grossly tongue is normal . Neck: no obvious mass . Cardiovascular: S1 S2 normal no gallop . Respiratory: No rhonchi rales are noted at this time . Abdomen: soft . Skin: no rash seen on limited exam . Musculoskeletal: not rigid . Psychiatric:unable to assess . Neurologic: no seizure no involuntary movements         Lab Data:   Basic Metabolic Panel: Recent Labs  Lab 03/02/19 0654 03/04/19 0542 03/07/19 0749 03/07/19 1423  NA 135 133* 129* 129*  K 3.7 3.8 4.0 3.8  CL 99 96* 94* 91*  CO2 26 24 23   --   GLUCOSE 48* 176* 166* 127*  BUN 69* 83* 89* 102*  CREATININE 4.24* 4.75* 4.55* 4.70*  CALCIUM 8.3* 8.3* 7.9*  --   PHOS 3.9 4.1 3.5  --     ABG: No results for input(s): PHART, PCO2ART, PO2ART, HCO3, O2SAT in the last 168 hours.  Liver Function Tests: Recent Labs  Lab 03/02/19 0654 03/04/19 0542 03/07/19 0749  ALBUMIN 1.5* 1.5* 1.5*   No results for input(s): LIPASE, AMYLASE in the last 168 hours. No results for input(s): AMMONIA in the last 168 hours.  CBC: Recent Labs  Lab 03/04/19 1811 03/05/19 0705 03/06/19 0812  03/07/19 0749 03/07/19 1423 03/08/19 0536  WBC 8.8 7.1 7.6 7.6  --  8.4  HGB 8.7* 7.5* 7.3* 7.0* 7.8* 11.2*  HCT 26.3* 22.9* 22.3* 21.4* 23.0* 33.4*  MCV 83.8 84.8 83.5 83.9  --  85.9  PLT 215 195 184 183  --  172    Cardiac Enzymes: No results for input(s): CKTOTAL, CKMB, CKMBINDEX, TROPONINI in the last 168 hours.  BNP (last 3 results) No results for input(s): BNP in the last 8760 hours.  ProBNP (last 3 results) No results for input(s): PROBNP in the last 8760 hours.  Radiological Exams: No results found.  Assessment/Plan Active Problems:   Acute on chronic respiratory failure with hypoxia (HCC)   Acute on chronic systolic and diastolic heart failure, NYHA class 3 (HCC)   Acute renal failure superimposed on stage 3 chronic kidney disease (HCC)   Healthcare-associated pneumonia   COPD, severe (HCC)   Duodenal ulcer   Anemia due to GI blood loss   1. Acute on chronic respiratory failure hypoxia at this time patient continued on the ventilator and full support.  Chest x-ray was ordered ABG was ordered 2. Acute on chronic diastolic heart failure significant fluid overload still noted on the last chest follow-up x-ray ordered 3. Acute renal failure followed by nephrology 4. Healthcare associated pneumonia treated 5. Severe COPD nebulizers as necessary 6. Duodenal ulcer per GI recommendations   I have personally seen and  evaluated the patient, evaluated laboratory and imaging results, formulated the assessment and plan and placed orders. The Patient requires high complexity decision making for assessment and support.  Case was discussed on Rounds with the Respiratory Therapy Staff  Allyne Gee, MD [facility] Pulmonary Critical Care Medicine Sleep Medicine

## 2019-03-08 NOTE — Telephone Encounter (Signed)
-----   Message from Irving Copas., MD sent at 03/08/2019 12:00 PM EST ----- Thank you for update. He should be seen in clinic with his family to facilitate any repeat endoscopic evaluation. Thank you. GM ----- Message ----- From: Willia Craze, NP Sent: 03/08/2019  11:46 AM EST To: Timothy Lasso, RN, Irving Copas., MD  Patient will need follow up EGD in 8 weeks. He may still be in [facility] at that time.  Thanks

## 2019-03-09 ENCOUNTER — Encounter (HOSPITAL_COMMUNITY): Payer: Self-pay | Admitting: Gastroenterology

## 2019-03-09 ENCOUNTER — Other Ambulatory Visit (HOSPITAL_COMMUNITY): Payer: Medicare Other

## 2019-03-09 DIAGNOSIS — J9621 Acute and chronic respiratory failure with hypoxia: Secondary | ICD-10-CM

## 2019-03-09 DIAGNOSIS — N183 Chronic kidney disease, stage 3 unspecified: Secondary | ICD-10-CM | POA: Diagnosis not present

## 2019-03-09 DIAGNOSIS — N179 Acute kidney failure, unspecified: Secondary | ICD-10-CM | POA: Diagnosis not present

## 2019-03-09 DIAGNOSIS — I5043 Acute on chronic combined systolic (congestive) and diastolic (congestive) heart failure: Secondary | ICD-10-CM

## 2019-03-09 DIAGNOSIS — J189 Pneumonia, unspecified organism: Secondary | ICD-10-CM | POA: Diagnosis not present

## 2019-03-09 DIAGNOSIS — J449 Chronic obstructive pulmonary disease, unspecified: Secondary | ICD-10-CM | POA: Diagnosis not present

## 2019-03-09 LAB — RENAL FUNCTION PANEL
Albumin: 1.7 g/dL — ABNORMAL LOW (ref 3.5–5.0)
Anion gap: 12 (ref 5–15)
BUN: 81 mg/dL — ABNORMAL HIGH (ref 8–23)
CO2: 25 mmol/L (ref 22–32)
Calcium: 8.1 mg/dL — ABNORMAL LOW (ref 8.9–10.3)
Chloride: 92 mmol/L — ABNORMAL LOW (ref 98–111)
Creatinine, Ser: 4.1 mg/dL — ABNORMAL HIGH (ref 0.61–1.24)
GFR calc Af Amer: 16 mL/min — ABNORMAL LOW (ref >60)
GFR calc non Af Amer: 14 mL/min — ABNORMAL LOW (ref >60)
Glucose, Bld: 183 mg/dL — ABNORMAL HIGH (ref 70–99)
Phosphorus: 3.6 mg/dL (ref 2.5–4.6)
Potassium: 3.8 mmol/L (ref 3.5–5.1)
Sodium: 129 mmol/L — ABNORMAL LOW (ref 135–145)

## 2019-03-09 LAB — CBC
HCT: 34.3 % — ABNORMAL LOW (ref 39.0–52.0)
Hemoglobin: 11.4 g/dL — ABNORMAL LOW (ref 13.0–17.0)
MCH: 28.8 pg (ref 26.0–34.0)
MCHC: 33.2 g/dL (ref 30.0–36.0)
MCV: 86.6 fL (ref 80.0–100.0)
Platelets: 169 10*3/uL (ref 150–400)
RBC: 3.96 MIL/uL — ABNORMAL LOW (ref 4.22–5.81)
RDW: 15.6 % — ABNORMAL HIGH (ref 11.5–15.5)
WBC: 7.8 10*3/uL (ref 4.0–10.5)
nRBC: 0 % (ref 0.0–0.2)

## 2019-03-09 LAB — SURGICAL PATHOLOGY

## 2019-03-09 MED ORDER — LIDOCAINE HCL (PF) 1 % IJ SOLN
INTRAMUSCULAR | Status: AC
  Filled 2019-03-09: qty 30

## 2019-03-09 MED ORDER — GENERIC EXTERNAL MEDICATION
Status: DC
Start: ? — End: 2019-03-09

## 2019-03-09 NOTE — Progress Notes (Signed)
Pulmonary Critical Care Medicine [place]   PULMONARY CRITICAL CARE SERVICE  PROGRESS NOTE  Date of Service: 03/09/2019  Paul Ball  [id number]  DOB: 1945-08-02   DOA: 02/15/2019  Referring Physician: [name], MD  HPI: Paul Ball is a 73 y.o. male seen for follow up of Acute on Chronic Respiratory Failure.  Patient currently is on assist control mode on full support has been on 40% FiO2  Medications: Reviewed on Rounds  Physical Exam:  Vitals: Temperature 97.1 pulse 82 respiratory 18 blood pressure 165/63 saturation 93%  Ventilator Settings mode ventilation assist control FiO2 40% tidal line 429 PEEP 7  . General: Comfortable at this time . Eyes: Grossly normal lids, irises & conjunctiva . ENT: grossly tongue is normal . Neck: no obvious mass . Cardiovascular: S1 S2 normal no gallop . Respiratory: No rhonchi no rales are noted at this time . Abdomen: soft . Skin: no rash seen on limited exam . Musculoskeletal: not rigid . Psychiatric:unable to assess . Neurologic: no seizure no involuntary movements         Lab Data:   Basic Metabolic Panel: Recent Labs  Lab 03/04/19 0542 03/07/19 0749 03/07/19 1423 03/09/19 0502  NA 133* 129* 129* 129*  K 3.8 4.0 3.8 3.8  CL 96* 94* 91* 92*  CO2 24 23  --  25  GLUCOSE 176* 166* 127* 183*  BUN 83* 89* 102* 81*  CREATININE 4.75* 4.55* 4.70* 4.10*  CALCIUM 8.3* 7.9*  --  8.1*  PHOS 4.1 3.5  --  3.6    ABG: Recent Labs  Lab 03/08/19 0950  PHART 7.421  PCO2ART 40.5  PO2ART 180*  HCO3 25.9  O2SAT 99.6    Liver Function Tests: Recent Labs  Lab 03/04/19 0542 03/07/19 0749 03/09/19 0502  ALBUMIN 1.5* 1.5* 1.7*   No results for input(s): LIPASE, AMYLASE in the last 168 hours. No results for input(s): AMMONIA in the last 168 hours.  CBC: Recent Labs  Lab 03/05/19 0705 03/06/19 0812 03/07/19 0749 03/07/19 1423 03/08/19 0536 03/09/19 0502  WBC 7.1 7.6 7.6  --  8.4 7.8  HGB  7.5* 7.3* 7.0* 7.8* 11.2* 11.4*  HCT 22.9* 22.3* 21.4* 23.0* 33.4* 34.3*  MCV 84.8 83.5 83.9  --  85.9 86.6  PLT 195 184 183  --  172 169    Cardiac Enzymes: No results for input(s): CKTOTAL, CKMB, CKMBINDEX, TROPONINI in the last 168 hours.  BNP (last 3 results) No results for input(s): BNP in the last 8760 hours.  ProBNP (last 3 results) No results for input(s): PROBNP in the last 8760 hours.  Radiological Exams: Dg Chest Port 1 View  Result Date: 03/08/2019 CLINICAL DATA:  Congestive heart failure. EXAM: PORTABLE CHEST 1 VIEW COMPARISON:  Chest x-rays dated 03/02/2019 and 02/24/2019. FINDINGS: Tracheostomy tube remains appropriately positioned in the midline. RIGHT IJ central line appears well positioned with tip at the level of the mid/lower SVC. Stable diffuse bilateral airspace opacities, incompletely imaged at the lung bases on today's exam. Probable bilateral pleural effusions, small to moderate in size. No pneumothorax seen. IMPRESSION: 1. Stable diffuse bilateral airspace opacities, compatible with pulmonary edema pattern, incompletely imaged at the lung bases on today's exam. 2. Probable small to moderate bilateral pleural effusions. Electronically Signed   By: Franki Cabot M.D.   On: 03/08/2019 10:02    Assessment/Plan Active Problems:   Acute on chronic respiratory failure with hypoxia (HCC)   Acute on chronic systolic and diastolic heart failure,  NYHA class 3 (HCC)   Acute renal failure superimposed on stage 3 chronic kidney disease (HCC)   Healthcare-associated pneumonia   COPD, severe (HCC)   Duodenal ulcer   Anemia due to GI blood loss   1. Acute on chronic respiratory failure hypoxia plan is to continue with full support patient was attempted on pressure support and did not tolerate it 2. Acute on chronic systolic heart failure continue with present management 3. Acute renal failure followed by nephrology 4. Healthcare associated pneumonia treated 5. Severe COPD  at baseline   I have personally seen and evaluated the patient, evaluated laboratory and imaging results, formulated the assessment and plan and placed orders. The Patient requires high complexity decision making for assessment and support.  Case was discussed on Rounds with the Respiratory Therapy Staff  Allyne Gee, MD [facility] Pulmonary Critical Care Medicine Sleep Medicine

## 2019-03-09 NOTE — Progress Notes (Signed)
[facility]  ROUNDING NOTE   Subjective:  Patient seen at bedside. Due for dialysis today. Still on the ventilator. FiO2 currently 40%.    Objective:  Vital signs in last 24 hours:  Temperature 97.1 pulse 82 respirations 18 blood pressure 165/63  Physical Exam: General: Critically ill-appearing  Head: Normocephalic, atraumatic. Moist oral mucosal membranes  Eyes: Anicteric  Neck: Tracheostomy in place  Lungs:  Scattered rhonchi, vent assisted  Heart: S1S2 no rubs  Abdomen:  Soft, nontender, bowel sounds present, PEG in place  Extremities: 2+ peripheral edema.  Neurologic: Not following commands  Skin: No rash  Access: Right internal jugular temporary dialysis catheter    Basic Metabolic Panel: Recent Labs  Lab 03/04/19 0542 03/07/19 0749 03/07/19 1423 03/09/19 0502  NA 133* 129* 129* 129*  K 3.8 4.0 3.8 3.8  CL 96* 94* 91* 92*  CO2 24 23  --  25  GLUCOSE 176* 166* 127* 183*  BUN 83* 89* 102* 81*  CREATININE 4.75* 4.55* 4.70* 4.10*  CALCIUM 8.3* 7.9*  --  8.1*  PHOS 4.1 3.5  --  3.6    Liver Function Tests: Recent Labs  Lab 03/04/19 0542 03/07/19 0749 03/09/19 0502  ALBUMIN 1.5* 1.5* 1.7*   No results for input(s): LIPASE, AMYLASE in the last 168 hours. No results for input(s): AMMONIA in the last 168 hours.  CBC: Recent Labs  Lab 03/05/19 0705 03/06/19 0812 03/07/19 0749 03/07/19 1423 03/08/19 0536 03/09/19 0502  WBC 7.1 7.6 7.6  --  8.4 7.8  HGB 7.5* 7.3* 7.0* 7.8* 11.2* 11.4*  HCT 22.9* 22.3* 21.4* 23.0* 33.4* 34.3*  MCV 84.8 83.5 83.9  --  85.9 86.6  PLT 195 184 183  --  172 169    Cardiac Enzymes: No results for input(s): CKTOTAL, CKMB, CKMBINDEX, TROPONINI in the last 168 hours.  BNP: Invalid input(s): POCBNP  CBG: No results for input(s): GLUCAP in the last 168 hours.  Microbiology: Results for orders placed or performed during the hospital encounter of 02/15/19  Culture, Urine     Status: None   Collection  Time: 02/15/19 12:52 AM   Specimen: Urine, Random  Result Value Ref Range Status   Specimen Description URINE, RANDOM  Final   Special Requests NONE  Final   Culture   Final    NO GROWTH Performed at [facility], [place] [address], [place], [place] [address]    Report Status 02/17/2019 FINAL  Final  Culture, respiratory (non-expectorated)     Status: None   Collection Time: 02/15/19 10:50 PM   Specimen: Tracheal Aspirate; Respiratory  Result Value Ref Range Status   Specimen Description TRACHEAL ASPIRATE  Final   Special Requests NONE  Final   Gram Stain   Final    NO WBC SEEN RARE GRAM POSITIVE COCCI IN PAIRS Performed at [facility], [address] [place], [place] [address]    Culture MODERATE ENTEROCOCCUS FAECALIS  Final   Report Status 02/19/2019 FINAL  Final   Organism ID, Bacteria ENTEROCOCCUS FAECALIS  Final      Susceptibility   Enterococcus faecalis - MIC*    AMPICILLIN <=2 SENSITIVE Sensitive     VANCOMYCIN 1 SENSITIVE Sensitive     GENTAMICIN SYNERGY RESISTANT Resistant     * MODERATE ENTEROCOCCUS FAECALIS  SARS Coronavirus 2 by RT PCR (hospital order, performed in [place] hospital lab) Nasopharyngeal Nasopharyngeal Swab     Status: None   Collection Time: 02/16/19  2:20  PM   Specimen: Nasopharyngeal Swab  Result Value Ref Range Status   SARS Coronavirus 2 NEGATIVE NEGATIVE Final    Comment: (NOTE) If result is NEGATIVE SARS-CoV-2 target nucleic acids are NOT DETECTED. The SARS-CoV-2 RNA is generally detectable in upper and lower  respiratory specimens during the acute phase of infection. The lowest  concentration of SARS-CoV-2 viral copies this assay can detect is 250  copies / mL. A negative result does not preclude SARS-CoV-2 infection  and should not be used as the sole basis for treatment or other  patient management decisions.  A negative result may occur with  improper specimen collection / handling, submission of specimen other  than  nasopharyngeal swab, presence of viral mutation(s) within the  areas targeted by this assay, and inadequate number of viral copies  (<250 copies / mL). A negative result must be combined with clinical  observations, patient history, and epidemiological information. If result is POSITIVE SARS-CoV-2 target nucleic acids are DETECTED. The SARS-CoV-2 RNA is generally detectable in upper and lower  respiratory specimens dur ing the acute phase of infection.  Positive  results are indicative of active infection with SARS-CoV-2.  Clinical  correlation with patient history and other diagnostic information is  necessary to determine patient infection status.  Positive results do  not rule out bacterial infection or co-infection with other viruses. If result is PRESUMPTIVE POSTIVE SARS-CoV-2 nucleic acids MAY BE PRESENT.   A presumptive positive result was obtained on the submitted specimen  and confirmed on repeat testing.  While 2019 novel coronavirus  (SARS-CoV-2) nucleic acids may be present in the submitted sample  additional confirmatory testing may be necessary for epidemiological  and / or clinical management purposes  to differentiate between  SARS-CoV-2 and other Sarbecovirus currently known to infect humans.  If clinically indicated additional testing with an alternate test  methodology [phone]) is advised. The SARS-CoV-2 RNA is generally  detectable in upper and lower respiratory sp ecimens during the acute  phase of infection. The expected result is Negative. Fact Sheet for Patients:  StrictlyIdeas.no Fact Sheet for Healthcare Providers: BankingDealers.co.za This test is not yet approved or cleared by the [place] FDA and has been authorized for detection and/or diagnosis of SARS-CoV-2 by FDA under an Emergency Use Authorization (EUA).  This EUA will remain in effect (meaning this test can be used) for the duration of  the COVID-19 declaration under Section 564(b)(1) of the Act, 21 U.S.C. section 360bbb-3(b)(1), unless the authorization is terminated or revoked sooner. Performed at [facility], [place] [address], [place], [place] [address]   SARS CORONAVIRUS 2 (TAT 6-24 HRS) Nasopharyngeal Nasopharyngeal Swab     Status: None   Collection Time: 03/07/19  7:40 AM   Specimen: Nasopharyngeal Swab  Result Value Ref Range Status   SARS Coronavirus 2 NEGATIVE NEGATIVE Final    Comment: (NOTE) SARS-CoV-2 target nucleic acids are NOT DETECTED. The SARS-CoV-2 RNA is generally detectable in upper and lower respiratory specimens during the acute phase of infection. Negative results do not preclude SARS-CoV-2 infection, do not rule out co-infections with other pathogens, and should not be used as the sole basis for treatment or other patient management decisions. Negative results must be combined with clinical observations, patient history, and epidemiological information. The expected result is Negative. Fact Sheet for Patients: SugarRoll.be Fact Sheet for Healthcare Providers: https://www.woods-mathews.com/ This test is not yet approved or cleared by the [place] FDA and  has been authorized for detection and/or  diagnosis of SARS-CoV-2 by FDA under an Emergency Use Authorization (EUA). This EUA will remain  in effect (meaning this test can be used) for the duration of the COVID-19 declaration under Section 56 4(b)(1) of the Act, 21 U.S.C. section 360bbb-3(b)(1), unless the authorization is terminated or revoked sooner. Performed at [facility], [place] [address] [place], [place] [address]     Coagulation Studies: No results for input(s): LABPROT, INR in the last 72 hours.  Urinalysis: No results for input(s): COLORURINE, LABSPEC, PHURINE, GLUCOSEU, HGBUR, BILIRUBINUR, KETONESUR, PROTEINUR, UROBILINOGEN, NITRITE, LEUKOCYTESUR in the last 72  hours.  Invalid input(s): APPERANCEUR    Imaging: Dg Chest Port 1 View  Result Date: 03/08/2019 CLINICAL DATA:  Congestive heart failure. EXAM: PORTABLE CHEST 1 VIEW COMPARISON:  Chest x-rays dated 03/02/2019 and 02/24/2019. FINDINGS: Tracheostomy tube remains appropriately positioned in the midline. RIGHT IJ central line appears well positioned with tip at the level of the mid/lower SVC. Stable diffuse bilateral airspace opacities, incompletely imaged at the lung bases on today's exam. Probable bilateral pleural effusions, small to moderate in size. No pneumothorax seen. IMPRESSION: 1. Stable diffuse bilateral airspace opacities, compatible with pulmonary edema pattern, incompletely imaged at the lung bases on today's exam. 2. Probable small to moderate bilateral pleural effusions. Electronically Signed   By: Franki Cabot M.D.   On: 03/08/2019 10:02     Medications:     heparin, iohexol, lidocaine  Assessment/ Plan:  73 y.o. male with a PMHx of BPH, allergic rhinitis, COPD, diabetes mellitus type 2, hypertension, GERD, acute respiratory failure, congestive heart failure, chronic kidney disease stage IIIb EGFR 43 who was admitted to [facility] on 02/15/2019 for ongoing management of acute respiratory failure.   1.  Acute kidney injury/chronic kidney disease stage IIIb baseline GFR 43.    Patient seen at bedside.  He will be due for hemodialysis today.  Remains dialysis dependent at this time.  2.  Acute respiratory failure.  Still on the ventilator.  FiO2 currently 40% which is down slightly.  Continue to monitor respiratory status.  3.  Anemia of chronic kidney disease.  Hemoglobin now up to 11.4 posttransfusion.  Continue to monitor CBC.  4.  Secondary hyperparathyroidism.  Phosphorus 3.6 and at target.  Continue to monitor bone mineral metabolism parameters.   LOS: 0 Paul Ball 11/4/202012:07 PM

## 2019-03-09 NOTE — Procedures (Signed)
PROCEDURE SUMMARY:  UNSUCCESSFUL attempt at image-guided thoracentesis due to pleura thickening. No fluid was collected. This was reviewed with Dr. Pascal Lux, Janora Norlander, NP, and patient's daughter, Melodie (via telephone). EBL = 5-10 mL. Band-Aid applied to site.  Please see imaging section of Epic for full dictation.   Claris Pong Louk PA-C 03/09/2019 3:07 PM

## 2019-03-09 NOTE — Progress Notes (Signed)
     Progress Note    ASSESSMENT AND PLAN:   73 yo male with COPD, renal failure, DM, CVA, s/p PEG/ trach with heme positive anemia. EGD this admission >> non-bleeding duodenal ulcer, enlarged gastric folds of unclear clinical significance though gastric biopsies negative.Duodenal biopsies also negative and no H.pylori.   -Spoke with nursing staff. No further bleeding. Had non-bloody BM two days ago. Hgb is stable at 11.4.  -Patient has a follow up appt with [place] ( Dr. Rush Landmark) 04/21/19 at 11:30am -Continue daily PPI at discharge -GI will sign off, call with questions.      OBECTIVE:     Vital signs in last 24 hours: Vital reviewed in paper chart and stable.     General:   Intermittently opens eyes. Getting dialysis in room Heart:  Regular rate and rhythm Pulm: trach on ventilator. Abdomen:  Soft, mildly distended but mainly due to subcutaneous fluid.   Normal bowel sounds.            Intake/Output from previous day: No intake/output data recorded. Intake/Output this shift: No intake/output data recorded.  Lab Results: Recent Labs    03/07/19 0749 03/07/19 1423 03/08/19 0536 03/09/19 0502  WBC 7.6  --  8.4 7.8  HGB 7.0* 7.8* 11.2* 11.4*  HCT 21.4* 23.0* 33.4* 34.3*  PLT 183  --  172 169   BMET Recent Labs    03/07/19 0749 03/07/19 1423 03/09/19 0502  NA 129* 129* 129*  K 4.0 3.8 3.8  CL 94* 91* 92*  CO2 23  --  25  GLUCOSE 166* 127* 183*  BUN 89* 102* 81*  CREATININE 4.55* 4.70* 4.10*  CALCIUM 7.9*  --  8.1*   LFT Recent Labs    03/09/19 0502  ALBUMIN 1.7*   PT/INR No results for input(s): LABPROT, INR in the last 72 hours. Hepatitis Panel No results for input(s): HEPBSAG, HCVAB, HEPAIGM, HEPBIGM in the last 72 hours.  Dg Chest Port 1 View  Result Date: 03/08/2019 CLINICAL DATA:  Congestive heart failure. EXAM: PORTABLE CHEST 1 VIEW COMPARISON:  Chest x-rays dated 03/02/2019 and 02/24/2019. FINDINGS: Tracheostomy tube remains appropriately  positioned in the midline. RIGHT IJ central line appears well positioned with tip at the level of the mid/lower SVC. Stable diffuse bilateral airspace opacities, incompletely imaged at the lung bases on today's exam. Probable bilateral pleural effusions, small to moderate in size. No pneumothorax seen. IMPRESSION: 1. Stable diffuse bilateral airspace opacities, compatible with pulmonary edema pattern, incompletely imaged at the lung bases on today's exam. 2. Probable small to moderate bilateral pleural effusions. Electronically Signed   By: Franki Cabot M.D.   On: 03/08/2019 10:02     Active Problems:   Acute on chronic respiratory failure with hypoxia (HCC)   Acute on chronic systolic and diastolic heart failure, NYHA class 3 (HCC)   Acute renal failure superimposed on stage 3 chronic kidney disease (HCC)   Healthcare-associated pneumonia   COPD, severe (HCC)   Duodenal ulcer   Anemia due to GI blood loss     LOS: 0 days   Tye Savoy ,NP 03/09/2019, 2:14 PM

## 2019-03-09 NOTE — Consult Note (Signed)
    [facility] [place]  Patient name: Paul Ball MRN: [id number] DOB: Aug 18, 1945 Sex: male    HPI: Paul Ball is a 73 y.o. male seen in consultation for AV access.  His history is obtained from his chart.  He is a very unfortunate 73 year old gentleman with history of chronic renal insufficiency and now renal failure.  He has multiple medical problems.  Had become acutely ill.  He has a history of stroke and is bedridden.  He currently is intubated with pneumonia and a trach.  He has a temporary catheter in his right internal jugular vein.  Past Medical History:  Diagnosis Date  . Acute on chronic respiratory failure with hypoxia ([place])   . Acute on chronic systolic and diastolic heart failure, NYHA class 3 ([place])   . Acute renal failure superimposed on stage 3 chronic kidney disease ([place])   . COPD, severe ([place])   . Healthcare-associated pneumonia     History reviewed. No pertinent family history.  SOCIAL HISTORY: Social History   Tobacco Use  . Smoking status: Not on file  Substance Use Topics  . Alcohol use: Not on file    Allergies  Allergen Reactions  . Shellfish-Derived Products     Current Facility-Administered Medications  Medication Dose Route Frequency Provider Last Rate Last Dose  . heparin injection   Intravenous PRN Markus Daft, MD   2.8 mL at 02/19/19 1424  . iohexol (OMNIPAQUE) 300 MG/ML solution 40 mL  40 mL Per Tube Once PRN Bernadene Person, NP      . lidocaine (PF) (XYLOCAINE) 1 % injection           . lidocaine (XYLOCAINE) 1 % (with pres) injection   Infiltration PRN Markus Daft, MD   5 mL at 02/19/19 1420    REVIEW OF SYSTEMS:  Reviewed in his chart with nothing to add PHYSICAL EXAM: Vitals:   03/07/19 1409  BP: (!) 116/40  Pulse: 73  Resp: 12  Temp: 97.8 F (36.6 C)  TempSrc: Temporal  SpO2: 100%    GENERAL: The patient is a well-nourished male, i unresponsive on vent. The vital signs are  documented above. CARDIOVASCULAR: 2+ radial pulses bilaterally.  Diffuse edema bilaterally PULMONARY: There is good air exchange  MUSCULOSKELETAL: There are no major deformities or cyanosis. NEUROLOGIC: Unresponsive on vent SKIN: There are no ulcers or rashes noted.   DATA:  None pertinent  MEDICAL ISSUES: Very unfortunate patient with multisystem organ failure.  Currently undergoing hemodialysis via temporary catheter.  I would not recommend arm access at this setting.  Can plan a tunneled catheter for intermediate.  Dialysis which should function 4 months.  If he has recovery good plan arm access in the future    Rosetta Posner, MD [facility] Tel [phone] Pager [phone]

## 2019-03-10 ENCOUNTER — Other Ambulatory Visit (HOSPITAL_COMMUNITY): Payer: Medicare Other

## 2019-03-10 ENCOUNTER — Encounter (HOSPITAL_COMMUNITY): Payer: Medicare Other

## 2019-03-10 DIAGNOSIS — I5043 Acute on chronic combined systolic (congestive) and diastolic (congestive) heart failure: Secondary | ICD-10-CM | POA: Diagnosis not present

## 2019-03-10 DIAGNOSIS — N179 Acute kidney failure, unspecified: Secondary | ICD-10-CM | POA: Diagnosis not present

## 2019-03-10 DIAGNOSIS — J449 Chronic obstructive pulmonary disease, unspecified: Secondary | ICD-10-CM | POA: Diagnosis not present

## 2019-03-10 DIAGNOSIS — J9621 Acute and chronic respiratory failure with hypoxia: Secondary | ICD-10-CM | POA: Diagnosis not present

## 2019-03-10 NOTE — Progress Notes (Signed)
Pulmonary Critical Care Medicine [name]   PULMONARY CRITICAL CARE SERVICE  PROGRESS NOTE  Date of Service: 03/10/2019  Paul Ball  [name]  DOB: [birth year]   DOA: 02/15/2019  Referring Physician: [name], MD  HPI: Paul Ball is a 73 y.o. male seen for follow up of Acute on Chronic Respiratory Failure.  Patient is on full support currently on assist control mode and is requiring 45% FiO2  Medications: Reviewed on Rounds  Physical Exam:  Vitals: Temperature 98.1 pulse 73 respiratory rate 25 blood pressure 173/64 saturations 95%  Ventilator Settings mode ventilation assist control FiO2 45% tidal volume 420 PEEP 7  . General: Comfortable at this time . Eyes: Grossly normal lids, irises & conjunctiva . ENT: grossly tongue is normal . Neck: no obvious mass . Cardiovascular: S1 S2 normal no gallop . Respiratory: No rhonchi coarse breath sounds are noted . Abdomen: soft . Skin: no rash seen on limited exam . Musculoskeletal: not rigid . Psychiatric:unable to assess . Neurologic: no seizure no involuntary movements         Lab Data:   Basic Metabolic Panel: Recent Labs  Lab 03/04/19 0542 03/07/19 0749 03/07/19 1423 03/09/19 0502  NA 133* 129* 129* 129*  K 3.8 4.0 3.8 3.8  CL 96* 94* 91* 92*  CO2 24 23  --  25  GLUCOSE 176* 166* 127* 183*  BUN 83* 89* 102* 81*  CREATININE 4.75* 4.55* 4.70* 4.10*  CALCIUM 8.3* 7.9*  --  8.1*  PHOS 4.1 3.5  --  3.6    ABG: Recent Labs  Lab 03/08/19 0950  PHART 7.421  PCO2ART 40.5  PO2ART 180*  HCO3 25.9  O2SAT 99.6    Liver Function Tests: Recent Labs  Lab 03/04/19 0542 03/07/19 0749 03/09/19 0502  ALBUMIN 1.5* 1.5* 1.7*   No results for input(s): LIPASE, AMYLASE in the last 168 hours. No results for input(s): AMMONIA in the last 168 hours.  CBC: Recent Labs  Lab 03/05/19 0705 03/06/19 0812 03/07/19 0749 03/07/19 1423 03/08/19 0536 03/09/19 0502  WBC 7.1 7.6 7.6  --  8.4  7.8  HGB 7.5* 7.3* 7.0* 7.8* 11.2* 11.4*  HCT 22.9* 22.3* 21.4* 23.0* 33.4* 34.3*  MCV 84.8 83.5 83.9  --  85.9 86.6  PLT 195 184 183  --  172 169    Cardiac Enzymes: No results for input(s): CKTOTAL, CKMB, CKMBINDEX, TROPONINI in the last 168 hours.  BNP (last 3 results) No results for input(s): BNP in the last 8760 hours.  ProBNP (last 3 results) No results for input(s): PROBNP in the last 8760 hours.  Radiological Exams: Ct Chest Wo Contrast  Result Date: 03/10/2019 CLINICAL DATA:  Pleural effusion EXAM: CT CHEST WITHOUT CONTRAST TECHNIQUE: Multidetector CT imaging of the chest was performed following the standard protocol without IV contrast. COMPARISON:  None. FINDINGS: Cardiovascular: Heart size is enlarged. There is a well-positioned right-sided central venous catheter with tip terminating near the cavoatrial junction. Aortic calcifications are noted. The intracardiac blood pool is hypodense relative to the adjacent myocardium consistent with anemia. Coronary artery calcifications are noted. There is no significant pericardial effusion. Mediastinum/Nodes: --No mediastinal or hilar lymphadenopathy. --No axillary lymphadenopathy. --there is a pathologically enlarged left supraclavicular lymph node measuring approximately 2 cm (axial series 3, image 6). --Normal thyroid gland. --there is mild wall thickening of the distal esophagus. Lungs/Pleura: There are large bilateral pleural effusions with adjacent compressive atelectasis. There are diffuse interstitial lung markings bilaterally. Evaluation of the lung fields  is limited by respiratory motion artifact. There is a questionable trace right-sided pneumothorax (axial series 4, image 46). There is a questionable left-sided pneumothorax versus paraseptal emphysematous changes. There is a tracheostomy tube terminates above the carina. Upper Abdomen: The partially visualized upper abdomen demonstrates a gastrostomy tube that is only partially  visualized. There is a small volume of ascites in the upper abdomen. Musculoskeletal: No chest wall abnormality. No acute or significant osseous findings. IMPRESSION: 1. Large bilateral pleural effusions with adjacent compressive atelectasis. 2. Questionable bilateral pneumothoraces, more suspicious on the right. These are poorly evaluated secondary to extensive motion artifact. Other differential considerations include paraseptal emphysematous changes. Short interval repeat chest x-ray is recommended to confirm stability of this finding. 3. Prominent interstitial lung markings may represent pulmonary edema or an atypical infectious process. 4. Cardiomegaly. 5. Pathologically enlarged left supraclavicular lymph node. 6. Ascites Aortic Atherosclerosis (ICD10-I70.0). Electronically Signed   By: Constance Holster M.D.   On: 03/10/2019 01:42   Dg Chest Port 1 View  Result Date: 03/08/2019 CLINICAL DATA:  Congestive heart failure. EXAM: PORTABLE CHEST 1 VIEW COMPARISON:  Chest x-rays dated 03/02/2019 and 02/24/2019. FINDINGS: Tracheostomy tube remains appropriately positioned in the midline. RIGHT IJ central line appears well positioned with tip at the level of the mid/lower SVC. Stable diffuse bilateral airspace opacities, incompletely imaged at the lung bases on today's exam. Probable bilateral pleural effusions, small to moderate in size. No pneumothorax seen. IMPRESSION: 1. Stable diffuse bilateral airspace opacities, compatible with pulmonary edema pattern, incompletely imaged at the lung bases on today's exam. 2. Probable small to moderate bilateral pleural effusions. Electronically Signed   By: Franki Cabot M.D.   On: 03/08/2019 10:02   US Thoracentesis Asp Pleural Space W/img Guide  Result Date: 03/09/2019 INDICATION: Patient with history of acute on chronic respiratory failure s/p tracheostomy placement, dyspnea, and bilateral pleural effusions. Request made for therapeutic right thoracentesis. EXAM:  ULTRASOUND GUIDED THERAPEUTIC RIGHT THORACENTESIS (UNSUCCESSFUL) MEDICATIONS: 20 mL 1% lidocaine COMPLICATIONS: None immediate. PROCEDURE: An ultrasound guided thoracentesis was thoroughly discussed with the patient and questions answered. The benefits, risks, alternatives and complications were also discussed. The patient understands and wishes to proceed with the procedure. Written consent was obtained. Ultrasound was performed to localize and mark an adequate pocket of fluid in the right chest. The area was then prepped and draped in the normal sterile fashion. 1% Lidocaine was used for local anesthesia. Even though there was an adequate amount of fluid and despite multiple attempts under ultrasound guidance using both a 6 Fr Safe-T-Centesis along with a 19 gauge, 10 cm, Yueh, no fluid was aspirated due to thickened pleura. Because of this, procedure was aborted. Band-aid was applied to site. FINDINGS: Right pleural effusion by ultrasound. Unsuccessful attempt at right thoracentesis. IMPRESSION: Unsuccessful right thoracentesis at bedside secondary to thickened pleura. Procedure aborted. Recommend CT chest with contrast for further evaluation of pleural fluid, and possible repeat thoracentesis if indicated. Read by: Earley Abide, PA-C Electronically Signed   By: Sandi Mariscal M.D.   On: 03/09/2019 15:09    Assessment/Plan Active Problems:   Acute on chronic respiratory failure with hypoxia (HCC)   Acute on chronic systolic and diastolic heart failure, NYHA class 3 (HCC)   Acute renal failure superimposed on stage 3 chronic kidney disease (HCC)   Healthcare-associated pneumonia   COPD, severe (HCC)   Duodenal ulcer   Anemia due to GI blood loss   1. Acute on chronic respiratory failure with hypoxia patient is continuing  on full support not tolerating weaning.  Patient apparently had a CT scan done chest after thoracentesis evaluation shows large pleural effusion questionable pneumothorax 2. Acute on  chronic systolic heart failure needs more aggressive fluid management 3. Acute on chronic renal failure per nephrology 4. Healthcare associated pneumonia treated 5. Severe COPD patient is at baseline we will continue present management   I have personally seen and evaluated the patient, evaluated laboratory and imaging results, formulated the assessment and plan and placed orders. The Patient requires high complexity decision making for assessment and support.  Case was discussed on Rounds with the Respiratory Therapy Staff  Allyne Gee, MD [facility] Pulmonary Critical Care Medicine Sleep Medicine

## 2019-03-11 ENCOUNTER — Other Ambulatory Visit (HOSPITAL_COMMUNITY): Payer: Medicare Other

## 2019-03-11 ENCOUNTER — Encounter (HOSPITAL_COMMUNITY): Payer: Medicare Other

## 2019-03-11 DIAGNOSIS — J449 Chronic obstructive pulmonary disease, unspecified: Secondary | ICD-10-CM | POA: Diagnosis not present

## 2019-03-11 DIAGNOSIS — J9621 Acute and chronic respiratory failure with hypoxia: Secondary | ICD-10-CM | POA: Diagnosis not present

## 2019-03-11 DIAGNOSIS — I5043 Acute on chronic combined systolic (congestive) and diastolic (congestive) heart failure: Secondary | ICD-10-CM | POA: Diagnosis not present

## 2019-03-11 DIAGNOSIS — N179 Acute kidney failure, unspecified: Secondary | ICD-10-CM | POA: Diagnosis not present

## 2019-03-11 LAB — CBC
HCT: 31.4 % — ABNORMAL LOW (ref 39.0–52.0)
Hemoglobin: 10.4 g/dL — ABNORMAL LOW (ref 13.0–17.0)
MCH: 28.9 pg (ref 26.0–34.0)
MCHC: 33.1 g/dL (ref 30.0–36.0)
MCV: 87.2 fL (ref 80.0–100.0)
Platelets: 194 10*3/uL (ref 150–400)
RBC: 3.6 MIL/uL — ABNORMAL LOW (ref 4.22–5.81)
RDW: 15.7 % — ABNORMAL HIGH (ref 11.5–15.5)
WBC: 7.4 10*3/uL (ref 4.0–10.5)
nRBC: 0 % (ref 0.0–0.2)

## 2019-03-11 LAB — RENAL FUNCTION PANEL
Albumin: 1.6 g/dL — ABNORMAL LOW (ref 3.5–5.0)
Anion gap: 11 (ref 5–15)
BUN: 73 mg/dL — ABNORMAL HIGH (ref 8–23)
CO2: 23 mmol/L (ref 22–32)
Calcium: 7.9 mg/dL — ABNORMAL LOW (ref 8.9–10.3)
Chloride: 93 mmol/L — ABNORMAL LOW (ref 98–111)
Creatinine, Ser: 3.71 mg/dL — ABNORMAL HIGH (ref 0.61–1.24)
GFR calc Af Amer: 18 mL/min — ABNORMAL LOW (ref >60)
GFR calc non Af Amer: 15 mL/min — ABNORMAL LOW (ref >60)
Glucose, Bld: 165 mg/dL — ABNORMAL HIGH (ref 70–99)
Phosphorus: 2.8 mg/dL (ref 2.5–4.6)
Potassium: 3.8 mmol/L (ref 3.5–5.1)
Sodium: 127 mmol/L — ABNORMAL LOW (ref 135–145)

## 2019-03-11 LAB — PROTEIN AND GLUCOSE, CSF
Glucose, CSF: 180 mg/dL — ABNORMAL HIGH (ref 40–70)
Total  Protein, CSF: >600 mg/dL — ABNORMAL HIGH (ref 15–45)

## 2019-03-11 LAB — GRAM STAIN
Gram Stain: NONE SEEN
Special Requests: NORMAL

## 2019-03-11 LAB — LACTATE DEHYDROGENASE: LDH: 338 U/L — ABNORMAL HIGH (ref 98–192)

## 2019-03-11 MED ORDER — LIDOCAINE HCL (PF) 1 % IJ SOLN
INTRAMUSCULAR | Status: AC
  Filled 2019-03-11: qty 30

## 2019-03-11 NOTE — Progress Notes (Addendum)
Pulmonary Critical Care Medicine [place]   PULMONARY CRITICAL CARE SERVICE  PROGRESS NOTE  Date of Service: 03/11/2019  Paul Ball  [id number]  DOB: Nov 22, 1945   DOA: 02/15/2019  Referring Physician: [name], MD  HPI: Paul Ball is a 73 y.o. male seen for follow up of Acute on Chronic Respiratory Failure.  Patient remains on full support ventilator this time had a left thoracentesis today with a removed 1300 mL from his left side.  Plan is to do right tomorrow.  Medications: Reviewed on Rounds  Physical Exam:  Vitals: Pulse 80 respirations 25 BP 172/69 O2 sat 100% temp 97.0  Ventilator Settings AC VC rate of 16 tidal 05/08/2018 PEEP of 7 with an FiO2 of 30%.  . General: Comfortable at this time . Eyes: Grossly normal lids, irises & conjunctiva . ENT: grossly tongue is normal . Neck: no obvious mass . Cardiovascular: S1 S2 normal no gallop . Respiratory: No rales or rhonchi noted . Abdomen: soft . Skin: no rash seen on limited exam . Musculoskeletal: not rigid . Psychiatric:unable to assess . Neurologic: no seizure no involuntary movements         Lab Data:   Basic Metabolic Panel: Recent Labs  Lab 03/07/19 0749 03/07/19 1423 03/09/19 0502 03/11/19 0542  NA 129* 129* 129* 127*  K 4.0 3.8 3.8 3.8  CL 94* 91* 92* 93*  CO2 23  --  25 23  GLUCOSE 166* 127* 183* 165*  BUN 89* 102* 81* 73*  CREATININE 4.55* 4.70* 4.10* 3.71*  CALCIUM 7.9*  --  8.1* 7.9*  PHOS 3.5  --  3.6 2.8    ABG: Recent Labs  Lab 03/08/19 0950  PHART 7.421  PCO2ART 40.5  PO2ART 180*  HCO3 25.9  O2SAT 99.6    Liver Function Tests: Recent Labs  Lab 03/07/19 0749 03/09/19 0502 03/11/19 0542  ALBUMIN 1.5* 1.7* 1.6*   No results for input(s): LIPASE, AMYLASE in the last 168 hours. No results for input(s): AMMONIA in the last 168 hours.  CBC: Recent Labs  Lab 03/06/19 0812 03/07/19 0749 03/07/19 1423 03/08/19 0536 03/09/19 0502 03/11/19 0542   WBC 7.6 7.6  --  8.4 7.8 7.4  HGB 7.3* 7.0* 7.8* 11.2* 11.4* 10.4*  HCT 22.3* 21.4* 23.0* 33.4* 34.3* 31.4*  MCV 83.5 83.9  --  85.9 86.6 87.2  PLT 184 183  --  172 169 194    Cardiac Enzymes: No results for input(s): CKTOTAL, CKMB, CKMBINDEX, TROPONINI in the last 168 hours.  BNP (last 3 results) No results for input(s): BNP in the last 8760 hours.  ProBNP (last 3 results) No results for input(s): PROBNP in the last 8760 hours.  Radiological Exams: Dg Chest 1 View  Result Date: 03/11/2019 CLINICAL DATA:  S/p left thoracentesis EXAM: CHEST  1 VIEW COMPARISON:  Chest x-ray 03/11/2019 6:35 a.m. FINDINGS: Tracheostomy tube remains in place. Right sided central venous catheter in the upper right atrium as before. Following thoracentesis there is less density at the left lung base with improved aeration, presumed resolution of pleural fluid. Increased interstitial markings remain evident bilaterally. Heart size is enlarged as before. No acute bone findings. IMPRESSION: No pneumothorax status post left thoracentesis. Persistent increased interstitial markings, potentially pulmonary edema and right pleural effusion. Electronically Signed   By: Zetta Bills M.D.   On: 03/11/2019 11:22   Ct Chest Wo Contrast  Result Date: 03/10/2019 CLINICAL DATA:  Pleural effusion EXAM: CT CHEST WITHOUT CONTRAST TECHNIQUE: Multidetector CT  imaging of the chest was performed following the standard protocol without IV contrast. COMPARISON:  None. FINDINGS: Cardiovascular: Heart size is enlarged. There is a well-positioned right-sided central venous catheter with tip terminating near the cavoatrial junction. Aortic calcifications are noted. The intracardiac blood pool is hypodense relative to the adjacent myocardium consistent with anemia. Coronary artery calcifications are noted. There is no significant pericardial effusion. Mediastinum/Nodes: --No mediastinal or hilar lymphadenopathy. --No axillary lymphadenopathy.  --there is a pathologically enlarged left supraclavicular lymph node measuring approximately 2 cm (axial series 3, image 6). --Normal thyroid gland. --there is mild wall thickening of the distal esophagus. Lungs/Pleura: There are large bilateral pleural effusions with adjacent compressive atelectasis. There are diffuse interstitial lung markings bilaterally. Evaluation of the lung fields is limited by respiratory motion artifact. There is a questionable trace right-sided pneumothorax (axial series 4, image 46). There is a questionable left-sided pneumothorax versus paraseptal emphysematous changes. There is a tracheostomy tube terminates above the carina. Upper Abdomen: The partially visualized upper abdomen demonstrates a gastrostomy tube that is only partially visualized. There is a small volume of ascites in the upper abdomen. Musculoskeletal: No chest wall abnormality. No acute or significant osseous findings. IMPRESSION: 1. Large bilateral pleural effusions with adjacent compressive atelectasis. 2. Questionable bilateral pneumothoraces, more suspicious on the right. These are poorly evaluated secondary to extensive motion artifact. Other differential considerations include paraseptal emphysematous changes. Short interval repeat chest x-ray is recommended to confirm stability of this finding. 3. Prominent interstitial lung markings may represent pulmonary edema or an atypical infectious process. 4. Cardiomegaly. 5. Pathologically enlarged left supraclavicular lymph node. 6. Ascites Aortic Atherosclerosis (ICD10-I70.0). Electronically Signed   By: Constance Holster M.D.   On: 03/10/2019 01:42   Dg Chest Port 1 View  Result Date: 03/11/2019 CLINICAL DATA:  Tracheostomy.  Pleural effusion. EXAM: PORTABLE CHEST 1 VIEW COMPARISON:  CT 03/10/2019.  Chest x-ray 03/08/2019. FINDINGS: Tracheostomy tube and right IJ line stable position. Heart size stable. Diffuse severe bilateral pulmonary infiltrates/edema again  noted. Bilateral pleural effusions again noted. No pneumothorax noted as previously questioned on recent chest CT. Continued follow-up exams suggested for continued evaluation. IMPRESSION: 1.  Tracheostomy tube and right IJ line stable position. 2. Diffuse severe bilateral pulmonary infiltrates/edema and bilateral pleural effusions again noted without interim change. 3. No pneumothorax noted as previously questioned on recent chest CT. Continued follow-up chest x-rays suggested for continued evaluation. Electronically Signed   By: Marcello Moores  Register   On: 03/11/2019 06:52   US Thoracentesis Asp Pleural Space W/img Guide  Result Date: 03/11/2019 INDICATION: Acute on chronic respiratory failure. Bilateral pleural effusions. Request for diagnostic and therapeutic left thoracentesis. EXAM: ULTRASOUND GUIDED LEFT THORACENTESIS MEDICATIONS: 1% lidocaine 10 mL COMPLICATIONS: None immediate. PROCEDURE: An ultrasound guided thoracentesis was thoroughly discussed with the patient and questions answered. The benefits, risks, alternatives and complications were also discussed. The patient understands and wishes to proceed with the procedure. Written consent was obtained. Ultrasound was performed to localize and mark an adequate pocket of fluid in the left chest. The area was then prepped and draped in the normal sterile fashion. 1% Lidocaine was used for local anesthesia. Under ultrasound guidance a 6 Fr Safe-T-Centesis catheter was introduced. Thoracentesis was performed. The catheter was removed and a dressing applied. FINDINGS: A total of approximately 1.35 L of clear amber fluid was removed. Samples were sent to the laboratory as requested by the clinical team. IMPRESSION: Successful ultrasound guided left thoracentesis yielding 1.35 L of pleural fluid. No pneumothorax on  post-procedure chest x-ray. Read by: Gareth Eagle, PA-C Electronically Signed   By: Aletta Edouard M.D.   On: 03/11/2019 12:23     Assessment/Plan Active Problems:   Acute on chronic respiratory failure with hypoxia (HCC)   Acute on chronic systolic and diastolic heart failure, NYHA class 3 (HCC)   Acute renal failure superimposed on stage 3 chronic kidney disease (HCC)   Healthcare-associated pneumonia   COPD, severe (HCC)   Duodenal ulcer   Anemia due to GI blood loss   1. Acute on chronic respiratory failure with hypoxia patient did well with left-sided thoracentesis and 1300 mL were removed.  Plan is to do right side tomorrow continue supportive measures at this time.  Thoracentesis evaluation shows large pleural effusion questionable pneumothorax 2. Acute on chronic systolic heart failure needs more aggressive fluid management 3. Acute on chronic renal failure per nephrology 4. Healthcare associated pneumonia treated 5. Severe COPD patient is at baseline we will continue present management   I have personally seen and evaluated the patient, evaluated laboratory and imaging results, formulated the assessment and plan and placed orders. The Patient requires high complexity decision making for assessment and support.  Case was discussed on Rounds with the Respiratory Therapy Staff  Allyne Gee, MD [facility] Pulmonary Critical Care Medicine Sleep Medicine

## 2019-03-11 NOTE — Progress Notes (Signed)
[facility]  ROUNDING NOTE   Subjective:  Paul Ball weaned down to 35%.  Remains critically ill. Paul Ball to have dialysis performed today.   Objective:  Vital signs in last 24 hours:  Temperature 99 pulse 80 respirations 25 blood pressure 172/69  Physical Exam: General: Critically ill-appearing  Head: Normocephalic, atraumatic. Moist oral mucosal membranes  Eyes: Anicteric  Neck: Tracheostomy in place  Lungs:  Scattered rhonchi, vent assisted  Heart: S1S2 no rubs  Abdomen:  Soft, nontender, bowel sounds present, PEG in place  Extremities: 2+ peripheral edema.  Neurologic: Not following commands  Skin: No rash  Access: Right internal jugular temporary dialysis catheter    Basic Metabolic Panel: Recent Labs  Lab 03/07/19 0749 03/07/19 1423 03/09/19 0502 03/11/19 0542  NA 129* 129* 129* 127*  K 4.0 3.8 3.8 3.8  CL 94* 91* 92* 93*  CO2 23  --  25 23  GLUCOSE 166* 127* 183* 165*  BUN 89* 102* 81* 73*  CREATININE 4.55* 4.70* 4.10* 3.71*  CALCIUM 7.9*  --  8.1* 7.9*  PHOS 3.5  --  3.6 2.8    Liver Function Tests: Recent Labs  Lab 03/07/19 0749 03/09/19 0502 03/11/19 0542  ALBUMIN 1.5* 1.7* 1.6*   No results for input(s): LIPASE, AMYLASE in the last 168 hours. No results for input(s): AMMONIA in the last 168 hours.  CBC: Recent Labs  Lab 03/06/19 0812 03/07/19 0749 03/07/19 1423 03/08/19 0536 03/09/19 0502 03/11/19 0542  WBC 7.6 7.6  --  8.4 7.8 7.4  HGB 7.3* 7.0* 7.8* 11.2* 11.4* 10.4*  HCT 22.3* 21.4* 23.0* 33.4* 34.3* 31.4*  MCV 83.5 83.9  --  85.9 86.6 87.2  PLT 184 183  --  172 169 194    Cardiac Enzymes: No results for input(s): CKTOTAL, CKMB, CKMBINDEX, TROPONINI in the last 168 hours.  BNP: Invalid input(s): POCBNP  CBG: No results for input(s): GLUCAP in the last 168 hours.  Microbiology: Results for orders placed or performed during the hospital encounter of 02/15/19  Culture, Urine     Status: None   Collection Time: 02/15/19  12:52 AM   Specimen: Urine, Random  Result Value Ref Range Status   Specimen Description URINE, RANDOM  Final   Special Requests NONE  Final   Culture   Final    NO GROWTH Performed at [facility], [place] [address] [place], [place] [address]    Report Status 02/17/2019 FINAL  Final  Culture, respiratory (non-expectorated)     Status: None   Collection Time: 02/15/19 10:50 PM   Specimen: Tracheal Aspirate; Respiratory  Result Value Ref Range Status   Specimen Description TRACHEAL ASPIRATE  Final   Special Requests NONE  Final   Gram Stain   Final    NO WBC SEEN RARE GRAM POSITIVE COCCI IN PAIRS Performed at [facility], [address] [place], [place] [address]    Culture MODERATE ENTEROCOCCUS FAECALIS  Final   Report Status 02/19/2019 FINAL  Final   Organism ID, Bacteria ENTEROCOCCUS FAECALIS  Final      Susceptibility   Enterococcus faecalis - MIC*    AMPICILLIN <=2 SENSITIVE Sensitive     VANCOMYCIN 1 SENSITIVE Sensitive     GENTAMICIN SYNERGY RESISTANT Resistant     * MODERATE ENTEROCOCCUS FAECALIS  SARS Coronavirus 2 by RT PCR (hospital order, performed in [place] hospital lab) Nasopharyngeal Nasopharyngeal Swab     Status: None   Collection Time: 02/16/19  2:20 PM  Specimen: Nasopharyngeal Swab  Result Value Ref Range Status   SARS Coronavirus 2 NEGATIVE NEGATIVE Final    Comment: (NOTE) If result is NEGATIVE SARS-CoV-2 target nucleic acids are NOT DETECTED. The SARS-CoV-2 RNA is generally detectable in upper and lower  respiratory specimens during the acute phase of infection. The lowest  concentration of SARS-CoV-2 viral copies this assay can detect is 250  copies / mL. A negative result does not preclude SARS-CoV-2 infection  and should not be used as the sole basis for treatment or other  patient management decisions.  A negative result may occur with  improper specimen collection / handling, submission of specimen other  than nasopharyngeal  swab, presence of viral mutation(s) within the  areas targeted by this assay, and inadequate number of viral copies  (<250 copies / mL). A negative result must be combined with clinical  observations, patient history, and epidemiological information. If result is POSITIVE SARS-CoV-2 target nucleic acids are DETECTED. The SARS-CoV-2 RNA is generally detectable in upper and lower  respiratory specimens dur ing the acute phase of infection.  Positive  results are indicative of active infection with SARS-CoV-2.  Clinical  correlation with patient history and other diagnostic information is  necessary to determine patient infection status.  Positive results do  not rule out bacterial infection or co-infection with other viruses. If result is PRESUMPTIVE POSTIVE SARS-CoV-2 nucleic acids MAY BE PRESENT.   A presumptive positive result was obtained on the submitted specimen  and confirmed on repeat testing.  While 2019 novel coronavirus  (SARS-CoV-2) nucleic acids may be present in the submitted sample  additional confirmatory testing may be necessary for epidemiological  and / or clinical management purposes  to differentiate between  SARS-CoV-2 and other Sarbecovirus currently known to infect humans.  If clinically indicated additional testing with an alternate test  methodology [phone]) is advised. The SARS-CoV-2 RNA is generally  detectable in upper and lower respiratory sp ecimens during the acute  phase of infection. The expected result is Negative. Fact Sheet for Patients:  StrictlyIdeas.no Fact Sheet for Healthcare Providers: BankingDealers.co.za This test is not yet approved or cleared by the [place] FDA and has been authorized for detection and/or diagnosis of SARS-CoV-2 by FDA under an Emergency Use Authorization (EUA).  This EUA will remain in effect (meaning this test can be used) for the duration of the COVID-19 declaration  under Section 564(b)(1) of the Act, 21 U.S.C. section 360bbb-3(b)(1), unless the authorization is terminated or revoked sooner. Performed at  [facility], [place] [address] [place], [place] [address]   SARS CORONAVIRUS 2 (TAT 6-24 HRS) Nasopharyngeal Nasopharyngeal Swab     Status: None   Collection Time: 03/07/19  7:40 AM   Specimen: Nasopharyngeal Swab  Result Value Ref Range Status   SARS Coronavirus 2 NEGATIVE NEGATIVE Final    Comment: (NOTE) SARS-CoV-2 target nucleic acids are NOT DETECTED. The SARS-CoV-2 RNA is generally detectable in upper and lower respiratory specimens during the acute phase of infection. Negative results do not preclude SARS-CoV-2 infection, do not rule out co-infections with other pathogens, and should not be used as the sole basis for treatment or other patient management decisions. Negative results must be combined with clinical observations, patient history, and epidemiological information. The expected result is Negative. Fact Sheet for Patients: SugarRoll.be Fact Sheet for Healthcare Providers: https://www.woods-mathews.com/ This test is not yet approved or cleared by the [place] FDA and  has been authorized for detection and/or diagnosis of SARS-CoV-2  by FDA under an Emergency Use Authorization (EUA). This EUA will remain  in effect (meaning this test can be used) for the duration of the COVID-19 declaration under Section 56 4(b)(1) of the Act, 21 U.S.C. section 360bbb-3(b)(1), unless the authorization is terminated or revoked sooner. Performed at [facility], [place] [address], [place], [place] [address]     Coagulation Studies: No results for input(s): LABPROT, INR in the last 72 hours.  Urinalysis: No results for input(s): COLORURINE, LABSPEC, PHURINE, GLUCOSEU, HGBUR, BILIRUBINUR, KETONESUR, PROTEINUR, UROBILINOGEN, NITRITE, LEUKOCYTESUR in the last 72 hours.  Invalid input(s):  APPERANCEUR    Imaging: Dg Chest 1 View  Result Date: 03/11/2019 CLINICAL DATA:  S/p left thoracentesis EXAM: CHEST  1 VIEW COMPARISON:  Chest x-ray 03/11/2019 6:35 a.m. FINDINGS: Tracheostomy tube remains in place. Right sided central venous catheter in the upper right atrium as before. Following thoracentesis there is less density at the left lung base with improved aeration, presumed resolution of pleural fluid. Increased interstitial markings remain evident bilaterally. Heart size is enlarged as before. No acute bone findings. IMPRESSION: No pneumothorax status post left thoracentesis. Persistent increased interstitial markings, potentially pulmonary edema and right pleural effusion. Electronically Signed   By: Zetta Bills M.D.   On: 03/11/2019 11:22   Ct Chest Wo Contrast  Result Date: 03/10/2019 CLINICAL DATA:  Pleural effusion EXAM: CT CHEST WITHOUT CONTRAST TECHNIQUE: Multidetector CT imaging of the chest was performed following the standard protocol without IV contrast. COMPARISON:  None. FINDINGS: Cardiovascular: Heart size is enlarged. There is a well-positioned right-sided central venous catheter with tip terminating near the cavoatrial junction. Aortic calcifications are noted. The intracardiac blood pool is hypodense relative to the adjacent myocardium consistent with anemia. Coronary artery calcifications are noted. There is no significant pericardial effusion. Mediastinum/Nodes: --No mediastinal or hilar lymphadenopathy. --No axillary lymphadenopathy. --there is a pathologically enlarged left supraclavicular lymph node measuring approximately 2 cm (axial series 3, image 6). --Normal thyroid gland. --there is mild wall thickening of the distal esophagus. Lungs/Pleura: There are large bilateral pleural effusions with adjacent compressive atelectasis. There are diffuse interstitial lung markings bilaterally. Evaluation of the lung fields is limited by respiratory motion artifact. There is a  questionable trace right-sided pneumothorax (axial series 4, image 46). There is a questionable left-sided pneumothorax versus paraseptal emphysematous changes. There is a tracheostomy tube terminates above the carina. Upper Abdomen: The partially visualized upper abdomen demonstrates a gastrostomy tube that is only partially visualized. There is a small volume of ascites in the upper abdomen. Musculoskeletal: No chest wall abnormality. No acute or significant osseous findings. IMPRESSION: 1. Large bilateral pleural effusions with adjacent compressive atelectasis. 2. Questionable bilateral pneumothoraces, more suspicious on the right. These are poorly evaluated secondary to extensive motion artifact. Other differential considerations include paraseptal emphysematous changes. Short interval repeat chest x-ray is recommended to confirm stability of this finding. 3. Prominent interstitial lung markings may represent pulmonary edema or an atypical infectious process. 4. Cardiomegaly. 5. Pathologically enlarged left supraclavicular lymph node. 6. Ascites Aortic Atherosclerosis (ICD10-I70.0). Electronically Signed   By: Constance Holster M.D.   On: 03/10/2019 01:42   Dg Chest Port 1 View  Result Date: 03/11/2019 CLINICAL DATA:  Tracheostomy.  Pleural effusion. EXAM: PORTABLE CHEST 1 VIEW COMPARISON:  CT 03/10/2019.  Chest x-ray 03/08/2019. FINDINGS: Tracheostomy tube and right IJ line stable position. Heart size stable. Diffuse severe bilateral pulmonary infiltrates/edema again noted. Bilateral pleural effusions again noted. No pneumothorax noted as previously questioned on recent chest  CT. Continued follow-up exams suggested for continued evaluation. IMPRESSION: 1.  Tracheostomy tube and right IJ line stable position. 2. Diffuse severe bilateral pulmonary infiltrates/edema and bilateral pleural effusions again noted without interim change. 3. No pneumothorax noted as previously questioned on recent chest CT.  Continued follow-up chest x-rays suggested for continued evaluation. Electronically Signed   By: Marcello Moores  Register   On: 03/11/2019 06:52   US Thoracentesis Asp Pleural Space W/img Guide  Result Date: 03/09/2019 INDICATION: Patient with history of acute on chronic respiratory failure s/p tracheostomy placement, dyspnea, and bilateral pleural effusions. Request made for therapeutic right thoracentesis. EXAM: ULTRASOUND GUIDED THERAPEUTIC RIGHT THORACENTESIS (UNSUCCESSFUL) MEDICATIONS: 20 mL 1% lidocaine COMPLICATIONS: None immediate. PROCEDURE: An ultrasound guided thoracentesis was thoroughly discussed with the patient and questions answered. The benefits, risks, alternatives and complications were also discussed. The patient understands and wishes to proceed with the procedure. Written consent was obtained. Ultrasound was performed to localize and mark an adequate pocket of fluid in the right chest. The area was then prepped and draped in the normal sterile fashion. 1% Lidocaine was used for local anesthesia. Even though there was an adequate amount of fluid and despite multiple attempts under ultrasound guidance using both a 6 Fr Safe-T-Centesis along with a 19 gauge, 10 cm, Yueh, no fluid was aspirated due to thickened pleura. Because of this, procedure was aborted. Band-aid was applied to site. FINDINGS: Right pleural effusion by ultrasound. Unsuccessful attempt at right thoracentesis. IMPRESSION: Unsuccessful right thoracentesis at bedside secondary to thickened pleura. Procedure aborted. Recommend CT chest with contrast for further evaluation of pleural fluid, and possible repeat thoracentesis if indicated. Read by: Earley Abide, PA-C Electronically Signed   By: Sandi Mariscal M.D.   On: 03/09/2019 15:09     Medications:    . lidocaine (PF)       heparin, iohexol, lidocaine  Assessment/ Plan:  73 y.o. male with a PMHx of BPH, allergic rhinitis, COPD, diabetes mellitus type 2, hypertension, GERD,  acute respiratory failure, congestive heart failure, chronic kidney disease stage IIIb EGFR 43 who was admitted to [facility] on 02/15/2019 for ongoing management of acute respiratory failure.   1.  Acute kidney injury/chronic kidney disease stage IIIb baseline GFR 43.    Paul Ball remains dialysis dependent, not a candidate for AVF at the moment, permcath for Monday.   2.  Acute respiratory failure. Paul Ball down to 35%, weaning as per pulm/cc.    3.  Anemia of chronic kidney disease.  Hgb currently 10.4, hold off on retacrit.   4.  Secondary hyperparathyroidism.  Phos currently 2.8 and acceptable.    LOS: 0 Randal Yepiz 11/6/202011:38 AM

## 2019-03-11 NOTE — Procedures (Signed)
PROCEDURE SUMMARY:  Successful US guided left thoracentesis. Yielded 1.35 L of clear amber fluid. Patient tolerated procedure well. No immediate complications. EBL = trace  Specimen was sent for labs.  Post procedure chest X-ray reveals no pneumothorax  Lambros Cerro S Yamato Kopf PA-C 03/11/2019 12:24 PM

## 2019-03-12 ENCOUNTER — Encounter: Payer: Self-pay | Admitting: Physician Assistant

## 2019-03-12 ENCOUNTER — Other Ambulatory Visit (HOSPITAL_COMMUNITY): Payer: Medicare Other

## 2019-03-12 DIAGNOSIS — N179 Acute kidney failure, unspecified: Secondary | ICD-10-CM | POA: Diagnosis not present

## 2019-03-12 DIAGNOSIS — J449 Chronic obstructive pulmonary disease, unspecified: Secondary | ICD-10-CM | POA: Diagnosis not present

## 2019-03-12 DIAGNOSIS — I5043 Acute on chronic combined systolic (congestive) and diastolic (congestive) heart failure: Secondary | ICD-10-CM | POA: Diagnosis not present

## 2019-03-12 DIAGNOSIS — J9621 Acute and chronic respiratory failure with hypoxia: Secondary | ICD-10-CM | POA: Diagnosis not present

## 2019-03-12 HISTORY — PX: IR US GUIDE BX ASP/DRAIN: IMG2392

## 2019-03-12 HISTORY — PX: IR GUIDED DRAIN W CATHETER PLACEMENT: IMG719

## 2019-03-12 LAB — GRAM STAIN

## 2019-03-12 LAB — LACTATE DEHYDROGENASE, PLEURAL OR PERITONEAL FLUID: LD, Fluid: 136 U/L — ABNORMAL HIGH (ref 3–23)

## 2019-03-12 LAB — GLUCOSE, PLEURAL OR PERITONEAL FLUID: Glucose, Fluid: 197 mg/dL

## 2019-03-12 LAB — PROTEIN, PLEURAL OR PERITONEAL FLUID: Total protein, fluid: <3 g/dL

## 2019-03-12 MED ORDER — MIDAZOLAM HCL 2 MG/2ML IJ SOLN
INTRAMUSCULAR | Status: AC | PRN
  Administered 2019-03-12: 1 mg via INTRAVENOUS

## 2019-03-12 MED ORDER — LIDOCAINE HCL (PF) 1 % IJ SOLN
INTRAMUSCULAR | Status: AC
  Filled 2019-03-12: qty 30

## 2019-03-12 MED ORDER — FENTANYL CITRATE (PF) 100 MCG/2ML IJ SOLN
INTRAMUSCULAR | Status: AC | PRN
  Administered 2019-03-12: 50 ug via INTRAVENOUS

## 2019-03-12 MED ORDER — LIDOCAINE HCL 1 % IJ SOLN
INTRAMUSCULAR | Status: AC | PRN
  Administered 2019-03-12: 10 mL

## 2019-03-12 MED ORDER — FENTANYL CITRATE (PF) 100 MCG/2ML IJ SOLN
INTRAMUSCULAR | Status: AC
  Filled 2019-03-12: qty 2

## 2019-03-12 MED ORDER — MIDAZOLAM HCL 2 MG/2ML IJ SOLN
INTRAMUSCULAR | Status: AC
  Filled 2019-03-12: qty 2

## 2019-03-12 MED ORDER — LIDOCAINE HCL 1 % IJ SOLN
INTRAMUSCULAR | Status: AC
  Filled 2019-03-12: qty 20

## 2019-03-12 NOTE — Progress Notes (Addendum)
Pulmonary Critical Care Medicine [place]   PULMONARY CRITICAL CARE SERVICE  PROGRESS NOTE  Date of Service: 03/12/2019  Paul Ball  [id number]  DOB: May 20, 1945   DOA: 02/15/2019  Referring Physician: [name], MD  HPI: Paul Ball is a 73 y.o. male seen for follow up of Acute on Chronic Respiratory Failure.  Patient had right-sided thoracentesis today were 1500 cc were removed however patient also had pneumothorax post thoracentesis so far doing well.  Medications: Reviewed on Rounds  Physical Exam:  Vitals: Pulse 85 respirations 19 BP 128/59 O2 sat 100% temp 96.6  Ventilator Settings AC VC rate of 16 tidal in 420 PEEP of 7 with FiO2 28%  . General: Comfortable at this time . Eyes: Grossly normal lids, irises & conjunctiva . ENT: grossly tongue is normal . Neck: no obvious mass . Cardiovascular: S1 S2 normal no gallop . Respiratory: No rales or rhonchi noted . Abdomen: soft . Skin: no rash seen on limited exam . Musculoskeletal: not rigid . Psychiatric:unable to assess . Neurologic: no seizure no involuntary movements         Lab Data:   Basic Metabolic Panel: Recent Labs  Lab 03/07/19 0749 03/07/19 1423 03/09/19 0502 03/11/19 0542  NA 129* 129* 129* 127*  K 4.0 3.8 3.8 3.8  CL 94* 91* 92* 93*  CO2 23  --  25 23  GLUCOSE 166* 127* 183* 165*  BUN 89* 102* 81* 73*  CREATININE 4.55* 4.70* 4.10* 3.71*  CALCIUM 7.9*  --  8.1* 7.9*  PHOS 3.5  --  3.6 2.8    ABG: Recent Labs  Lab 03/08/19 0950  PHART 7.421  PCO2ART 40.5  PO2ART 180*  HCO3 25.9  O2SAT 99.6    Liver Function Tests: Recent Labs  Lab 03/07/19 0749 03/09/19 0502 03/11/19 0542  ALBUMIN 1.5* 1.7* 1.6*   No results for input(s): LIPASE, AMYLASE in the last 168 hours. No results for input(s): AMMONIA in the last 168 hours.  CBC: Recent Labs  Lab 03/06/19 0812 03/07/19 0749 03/07/19 1423 03/08/19 0536 03/09/19 0502 03/11/19 0542  WBC 7.6 7.6  --   8.4 7.8 7.4  HGB 7.3* 7.0* 7.8* 11.2* 11.4* 10.4*  HCT 22.3* 21.4* 23.0* 33.4* 34.3* 31.4*  MCV 83.5 83.9  --  85.9 86.6 87.2  PLT 184 183  --  172 169 194    Cardiac Enzymes: No results for input(s): CKTOTAL, CKMB, CKMBINDEX, TROPONINI in the last 168 hours.  BNP (last 3 results) No results for input(s): BNP in the last 8760 hours.  ProBNP (last 3 results) No results for input(s): PROBNP in the last 8760 hours.  Radiological Exams: Dg Chest 1 View  Result Date: 03/12/2019 CLINICAL DATA:  S/P Right thora. 1.5 liters removed EXAM: CHEST  1 VIEW COMPARISON:  the previous day's study FINDINGS: Evacuation of right pneumothorax. Small-moderate right pneumothorax, right lung apex projecting over the posterior aspect right third rib. Mild interstitial and alveolar opacities bilaterally, left greater than right, marginally improved since previous. Heart size normal. No mediastinal shift. Right IJ dialysis catheter and endotracheal tube stable in position. There may be a small residual left pleural effusion. Visualized bones unremarkable. IMPRESSION: 1. Right pneumothorax post right thoracentesis. Electronically Signed   By: Lucrezia Europe M.D.   On: 03/12/2019 11:27   Dg Chest 1 View  Result Date: 03/11/2019 CLINICAL DATA:  S/p left thoracentesis EXAM: CHEST  1 VIEW COMPARISON:  Chest x-ray 03/11/2019 6:35 a.m. FINDINGS: Tracheostomy tube remains in  place. Right sided central venous catheter in the upper right atrium as before. Following thoracentesis there is less density at the left lung base with improved aeration, presumed resolution of pleural fluid. Increased interstitial markings remain evident bilaterally. Heart size is enlarged as before. No acute bone findings. IMPRESSION: No pneumothorax status post left thoracentesis. Persistent increased interstitial markings, potentially pulmonary edema and right pleural effusion. Electronically Signed   By: Zetta Bills M.D.   On: 03/11/2019 11:22   Dg  Chest Port 1 View  Result Date: 03/11/2019 CLINICAL DATA:  Tracheostomy.  Pleural effusion. EXAM: PORTABLE CHEST 1 VIEW COMPARISON:  CT 03/10/2019.  Chest x-ray 03/08/2019. FINDINGS: Tracheostomy tube and right IJ line stable position. Heart size stable. Diffuse severe bilateral pulmonary infiltrates/edema again noted. Bilateral pleural effusions again noted. No pneumothorax noted as previously questioned on recent chest CT. Continued follow-up exams suggested for continued evaluation. IMPRESSION: 1.  Tracheostomy tube and right IJ line stable position. 2. Diffuse severe bilateral pulmonary infiltrates/edema and bilateral pleural effusions again noted without interim change. 3. No pneumothorax noted as previously questioned on recent chest CT. Continued follow-up chest x-rays suggested for continued evaluation. Electronically Signed   By: Marcello Moores  Register   On: 03/11/2019 06:52   Ir Perc Pleural Drain Genia Harold Cath W/img Guide  Result Date: 03/12/2019 INDICATION: Moderate right pneumothorax after thoracentesis, intubated on positive pressure ventilation EXAM: RIGHT CHEST TUBE PLACEMENT UNDER FLUOROSCOPY MEDICATIONS: The patient is currently admitted to the hospital and receiving intravenous antibiotics. The antibiotics were administered within an appropriate time frame prior to the initiation of the procedure. ANESTHESIA/SEDATION: Intravenous Fentanyl 41mcg and Versed 1mg  were administered as conscious sedation during continuous monitoring of the patient's level of consciousness and physiological / cardiorespiratory status by the radiology RN, with a total moderate sedation time of 10 minutes. PROCEDURE: Informed written consent was obtained from the family after a thorough discussion of the procedural risks, benefits and alternatives. All questions were addressed. Maximal Sterile Barrier Technique was utilized including caps, mask, sterile gowns, sterile gloves, sterile drape, hand hygiene and skin  antiseptic. A timeout was performed prior to the initiation of the procedure. The right lateral thorax was prepped with chlorhexidine, draped in usual sterile fashion, infiltrated with 1% lidocaine. An appropriate skin entry site was determined fluoroscopically 18 gauge percutaneous entry needle advanced into the pleural space. Air could be aspirated in an Amplatz wire advanced easily, position confirmed on fluoroscopy. Tract dilated to facilitate placement of a 14 French pigtail drain catheter, placed laterally directed towards the apex. Catheter was secured externally with 0 Prolene suture and StatLock and placed to Pleur-evac -20 cm H2O suction. The patient tolerated the procedure well. FLUOROSCOPY TIME:  0.2 minutes; 17  uGym2 DAP COMPLICATIONS: None immediate. IMPRESSION: 1. Technically successful right chest tube placement under fluoroscopy. Electronically Signed   By: Lucrezia Europe M.D.   On: 03/12/2019 16:15   US Thoracentesis Asp Pleural Space W/img Guide  Result Date: 03/12/2019 INDICATION: Respiratory failure requiring tracheostomy and mechanical ventilation. Large right pleural effusion. Request for diagnostic and therapeutic thoracentesis. EXAM: ULTRASOUND GUIDED RIGHT THORACENTESIS MEDICATIONS: 1% lidocaine 10 mL COMPLICATIONS: SIR LEVEL B - Normal therapy, includes overnight admission for observation. PROCEDURE: An ultrasound guided thoracentesis was thoroughly discussed with the patient and questions answered. The benefits, risks, alternatives and complications were also discussed. The patient understands and wishes to proceed with the procedure. Written consent was obtained. Ultrasound was performed to localize and mark an adequate pocket of fluid in the right chest.  The area was then prepped and draped in the normal sterile fashion. 1% Lidocaine was used for local anesthesia. Under ultrasound guidance a catheter was introduced. Thoracentesis was performed. The catheter was removed and a dressing  applied. FINDINGS: A total of approximately 1.7 L of amber fluid was removed. Samples were sent to the laboratory as requested by the clinical team. IMPRESSION: Successful ultrasound guided right thoracentesis yielding 1.7 L of pleural fluid. Moderate pneumothorax on post-procedure chest x-ray. Given patient is on mechanical ventilation will proceed with placement of pigtail chest tube. Read by: Gareth Eagle, PA-C Electronically Signed   By: Lucrezia Europe M.D.   On: 03/12/2019 11:55   US Thoracentesis Asp Pleural Space W/img Guide  Result Date: 03/11/2019 INDICATION: Acute on chronic respiratory failure. Bilateral pleural effusions. Request for diagnostic and therapeutic left thoracentesis. EXAM: ULTRASOUND GUIDED LEFT THORACENTESIS MEDICATIONS: 1% lidocaine 10 mL COMPLICATIONS: None immediate. PROCEDURE: An ultrasound guided thoracentesis was thoroughly discussed with the patient and questions answered. The benefits, risks, alternatives and complications were also discussed. The patient understands and wishes to proceed with the procedure. Written consent was obtained. Ultrasound was performed to localize and mark an adequate pocket of fluid in the left chest. The area was then prepped and draped in the normal sterile fashion. 1% Lidocaine was used for local anesthesia. Under ultrasound guidance a 6 Fr Safe-T-Centesis catheter was introduced. Thoracentesis was performed. The catheter was removed and a dressing applied. FINDINGS: A total of approximately 1.35 L of clear amber fluid was removed. Samples were sent to the laboratory as requested by the clinical team. IMPRESSION: Successful ultrasound guided left thoracentesis yielding 1.35 L of pleural fluid. No pneumothorax on post-procedure chest x-ray. Read by: Gareth Eagle, PA-C Electronically Signed   By: Aletta Edouard M.D.   On: 03/11/2019 12:23    Assessment/Plan Active Problems:   Acute on chronic respiratory failure with hypoxia (HCC)   Acute on chronic  systolic and diastolic heart failure, NYHA class 3 (HCC)   Acute renal failure superimposed on stage 3 chronic kidney disease (HCC)   Healthcare-associated pneumonia   COPD, severe (HCC)   Duodenal ulcer   Anemia due to GI blood loss   1. Acute on chronic respiratory failure with hypoxia patient 1500 cc removed via thoracentesis from his right lung and now has a small pneumothorax.  Continue to monitor continue supportive measures.  2. Acute on chronic systolic heart failure needs more aggressive fluid management 3. Acute on chronic renal failure per nephrology 4. Healthcare associated pneumonia treated 5. Severe COPD patient is at baseline we will continue present management   I have personally seen and evaluated the patient, evaluated laboratory and imaging results, formulated the assessment and plan and placed orders. The Patient requires high complexity decision making for assessment and support.  Case was discussed on Rounds with the Respiratory Therapy Staff  Allyne Gee, MD [facility] Pulmonary Critical Care Medicine Sleep Medicine

## 2019-03-12 NOTE — Procedures (Signed)
PROCEDURE SUMMARY:  Difficult but successful US guided right thoracentesis. Yielded 1.7 L of amber fluid. Patient tolerated procedure well. No immediate complications. EBL = trace  Specimen was sent for labs.  Post procedure chest X-ray reveals moderate apical pneumothorax  Paul Ball S Jayshon Dommer PA-C 03/12/2019 11:52 AM

## 2019-03-12 NOTE — Procedures (Signed)
  Procedure: Right chest tube placement under fluoro 44f EBL:   minimal Complications:  none immediate  See full dictation in [facility].  Dillard Cannon MD Main # [phone] Pager  [phone]

## 2019-03-12 NOTE — Progress Notes (Signed)
Supervising Physician: Arne Cleveland  Patient Status:  [facility] - In-pt  Chief Complaint:  Pneumothorax after thoracentesis  HPI:  Patient known to our service.  He underwent left thoracentesis yesterday with no issues.  Today he underwent right thoracentesis which was difficult due to thickened pleura and coughing over vent requiring suctioning.  1.7 L was removed. Post procedure CXR showed moderate pneumothorax.  Given patient is on mechanical ventilation, will need to place a pigtail chest catheter.  Allergies: Shellfish-derived products  Medications: Prior to Admission medications   Not on File     Vital Signs: BP (!) 88/47 (BP Location: Right Arm)    Pulse 73    Temp 97.8 F (36.6 C) (Temporal)    Resp 12    SpO2 100%   Physical Exam Constitutional:      Comments: Awake, ill appearing  Cardiovascular:     Rate and Rhythm: Normal rate.  Pulmonary:     Comments: Tracheostomy/mechanical ventilation    Imaging: Dg Chest 1 View  Result Date: 03/12/2019 CLINICAL DATA:  S/P Right thora. 1.5 liters removed EXAM: CHEST  1 VIEW COMPARISON:  the previous day's study FINDINGS: Evacuation of right pneumothorax. Small-moderate right pneumothorax, right lung apex projecting over the posterior aspect right third rib. Mild interstitial and alveolar opacities bilaterally, left greater than right, marginally improved since previous. Heart size normal. No mediastinal shift. Right IJ dialysis catheter and endotracheal tube stable in position. There may be a small residual left pleural effusion. Visualized bones unremarkable. IMPRESSION: 1. Right pneumothorax post right thoracentesis. Electronically Signed   By: Lucrezia Europe M.D.   On: 03/12/2019 11:27   Dg Chest 1 View  Result Date: 03/11/2019 CLINICAL DATA:  S/p left thoracentesis EXAM: CHEST  1 VIEW COMPARISON:  Chest x-ray 03/11/2019 6:35 a.m. FINDINGS: Tracheostomy tube remains in place. Right sided central venous catheter in  the upper right atrium as before. Following thoracentesis there is less density at the left lung base with improved aeration, presumed resolution of pleural fluid. Increased interstitial markings remain evident bilaterally. Heart size is enlarged as before. No acute bone findings. IMPRESSION: No pneumothorax status post left thoracentesis. Persistent increased interstitial markings, potentially pulmonary edema and right pleural effusion. Electronically Signed   By: Zetta Bills M.D.   On: 03/11/2019 11:22   Ct Chest Wo Contrast  Result Date: 03/10/2019 CLINICAL DATA:  Pleural effusion EXAM: CT CHEST WITHOUT CONTRAST TECHNIQUE: Multidetector CT imaging of the chest was performed following the standard protocol without IV contrast. COMPARISON:  None. FINDINGS: Cardiovascular: Heart size is enlarged. There is a well-positioned right-sided central venous catheter with tip terminating near the cavoatrial junction. Aortic calcifications are noted. The intracardiac blood pool is hypodense relative to the adjacent myocardium consistent with anemia. Coronary artery calcifications are noted. There is no significant pericardial effusion. Mediastinum/Nodes: --No mediastinal or hilar lymphadenopathy. --No axillary lymphadenopathy. --there is a pathologically enlarged left supraclavicular lymph node measuring approximately 2 cm (axial series 3, image 6). --Normal thyroid gland. --there is mild wall thickening of the distal esophagus. Lungs/Pleura: There are large bilateral pleural effusions with adjacent compressive atelectasis. There are diffuse interstitial lung markings bilaterally. Evaluation of the lung fields is limited by respiratory motion artifact. There is a questionable trace right-sided pneumothorax (axial series 4, image 46). There is a questionable left-sided pneumothorax versus paraseptal emphysematous changes. There is a tracheostomy tube terminates above the carina. Upper Abdomen: The partially visualized  upper abdomen demonstrates a gastrostomy tube that is only partially  visualized. There is a small volume of ascites in the upper abdomen. Musculoskeletal: No chest wall abnormality. No acute or significant osseous findings. IMPRESSION: 1. Large bilateral pleural effusions with adjacent compressive atelectasis. 2. Questionable bilateral pneumothoraces, more suspicious on the right. These are poorly evaluated secondary to extensive motion artifact. Other differential considerations include paraseptal emphysematous changes. Short interval repeat chest x-ray is recommended to confirm stability of this finding. 3. Prominent interstitial lung markings may represent pulmonary edema or an atypical infectious process. 4. Cardiomegaly. 5. Pathologically enlarged left supraclavicular lymph node. 6. Ascites Aortic Atherosclerosis (ICD10-I70.0). Electronically Signed   By: Constance Holster M.D.   On: 03/10/2019 01:42   Dg Chest Port 1 View  Result Date: 03/11/2019 CLINICAL DATA:  Tracheostomy.  Pleural effusion. EXAM: PORTABLE CHEST 1 VIEW COMPARISON:  CT 03/10/2019.  Chest x-ray 03/08/2019. FINDINGS: Tracheostomy tube and right IJ line stable position. Heart size stable. Diffuse severe bilateral pulmonary infiltrates/edema again noted. Bilateral pleural effusions again noted. No pneumothorax noted as previously questioned on recent chest CT. Continued follow-up exams suggested for continued evaluation. IMPRESSION: 1.  Tracheostomy tube and right IJ line stable position. 2. Diffuse severe bilateral pulmonary infiltrates/edema and bilateral pleural effusions again noted without interim change. 3. No pneumothorax noted as previously questioned on recent chest CT. Continued follow-up chest x-rays suggested for continued evaluation. Electronically Signed   By: Marcello Moores  Register   On: 03/11/2019 06:52   US Thoracentesis Asp Pleural Space W/img Guide  Result Date: 03/11/2019 INDICATION: Acute on chronic respiratory failure.  Bilateral pleural effusions. Request for diagnostic and therapeutic left thoracentesis. EXAM: ULTRASOUND GUIDED LEFT THORACENTESIS MEDICATIONS: 1% lidocaine 10 mL COMPLICATIONS: None immediate. PROCEDURE: An ultrasound guided thoracentesis was thoroughly discussed with the patient and questions answered. The benefits, risks, alternatives and complications were also discussed. The patient understands and wishes to proceed with the procedure. Written consent was obtained. Ultrasound was performed to localize and mark an adequate pocket of fluid in the left chest. The area was then prepped and draped in the normal sterile fashion. 1% Lidocaine was used for local anesthesia. Under ultrasound guidance a 6 Fr Safe-T-Centesis catheter was introduced. Thoracentesis was performed. The catheter was removed and a dressing applied. FINDINGS: A total of approximately 1.35 L of clear amber fluid was removed. Samples were sent to the laboratory as requested by the clinical team. IMPRESSION: Successful ultrasound guided left thoracentesis yielding 1.35 L of pleural fluid. No pneumothorax on post-procedure chest x-ray. Read by: Gareth Eagle, PA-C Electronically Signed   By: Aletta Edouard M.D.   On: 03/11/2019 12:23   US Thoracentesis Asp Pleural Space W/img Guide  Result Date: 03/09/2019 INDICATION: Patient with history of acute on chronic respiratory failure s/p tracheostomy placement, dyspnea, and bilateral pleural effusions. Request made for therapeutic right thoracentesis. EXAM: ULTRASOUND GUIDED THERAPEUTIC RIGHT THORACENTESIS (UNSUCCESSFUL) MEDICATIONS: 20 mL 1% lidocaine COMPLICATIONS: None immediate. PROCEDURE: An ultrasound guided thoracentesis was thoroughly discussed with the patient and questions answered. The benefits, risks, alternatives and complications were also discussed. The patient understands and wishes to proceed with the procedure. Written consent was obtained. Ultrasound was performed to localize and  mark an adequate pocket of fluid in the right chest. The area was then prepped and draped in the normal sterile fashion. 1% Lidocaine was used for local anesthesia. Even though there was an adequate amount of fluid and despite multiple attempts under ultrasound guidance using both a 6 Fr Safe-T-Centesis along with a 19 gauge, 10 cm, Yueh, no fluid was aspirated  due to thickened pleura. Because of this, procedure was aborted. Band-aid was applied to site. FINDINGS: Right pleural effusion by ultrasound. Unsuccessful attempt at right thoracentesis. IMPRESSION: Unsuccessful right thoracentesis at bedside secondary to thickened pleura. Procedure aborted. Recommend CT chest with contrast for further evaluation of pleural fluid, and possible repeat thoracentesis if indicated. Read by: Earley Abide, PA-C Electronically Signed   By: Sandi Mariscal M.D.   On: 03/09/2019 15:09    Labs:  CBC: Recent Labs    03/07/19 0749 03/07/19 1423 03/08/19 0536 03/09/19 0502 03/11/19 0542  WBC 7.6  --  8.4 7.8 7.4  HGB 7.0* 7.8* 11.2* 11.4* 10.4*  HCT 21.4* 23.0* 33.4* 34.3* 31.4*  PLT 183  --  172 169 194    COAGS: Recent Labs    02/16/19 0658  INR 1.3*    BMP: Recent Labs    03/04/19 0542 03/07/19 0749 03/07/19 1423 03/09/19 0502 03/11/19 0542  NA 133* 129* 129* 129* 127*  K 3.8 4.0 3.8 3.8 3.8  CL 96* 94* 91* 92* 93*  CO2 24 23  --  25 23  GLUCOSE 176* 166* 127* 183* 165*  BUN 83* 89* 102* 81* 73*  CALCIUM 8.3* 7.9*  --  8.1* 7.9*  CREATININE 4.75* 4.55* 4.70* 4.10* 3.71*  GFRNONAA 11* 12*  --  14* 15*  GFRAA 13* 14*  --  16* 18*    LIVER FUNCTION TESTS: Recent Labs    02/16/19 0658  03/04/19 0542 03/07/19 0749 03/09/19 0502 03/11/19 0542  BILITOT 1.1  --   --   --   --   --   AST 21  --   --   --   --   --   ALT 15  --   --   --   --   --   ALKPHOS 112  --   --   --   --   --   PROT 6.0*  --   --   --   --   --   ALBUMIN 1.9*   < > 1.5* 1.5* 1.7* 1.6*   < > = values in this  interval not displayed.    Assessment and Plan:  Moderate right pneumothorax after thoracentesis.  Will proceed with placement of pigtail chest catheter today by Dr. Vernard Gambles.  Risks and benefits discussed with the patient including bleeding, infection, damage to adjacent structures.  All of the patient's daughter's questions were answered, patient is agreeable to proceed. Consent signed and in chart.  Electronically Signed: Murrell Redden, PA-C 03/12/2019, 11:55 AM    I spent a total of 15 Minutes at the the patient's bedside AND on the patient's hospital floor or unit, greater than 50% of which was counseling/coordinating care for pigtail chest catheter.

## 2019-03-13 ENCOUNTER — Other Ambulatory Visit (HOSPITAL_COMMUNITY): Payer: Medicare Other

## 2019-03-13 DIAGNOSIS — I5043 Acute on chronic combined systolic (congestive) and diastolic (congestive) heart failure: Secondary | ICD-10-CM | POA: Diagnosis not present

## 2019-03-13 DIAGNOSIS — N179 Acute kidney failure, unspecified: Secondary | ICD-10-CM | POA: Diagnosis not present

## 2019-03-13 DIAGNOSIS — J9621 Acute and chronic respiratory failure with hypoxia: Secondary | ICD-10-CM | POA: Diagnosis not present

## 2019-03-13 DIAGNOSIS — J449 Chronic obstructive pulmonary disease, unspecified: Secondary | ICD-10-CM | POA: Diagnosis not present

## 2019-03-13 NOTE — Progress Notes (Addendum)
Pulmonary Critical Care Medicine [place]   PULMONARY CRITICAL CARE SERVICE  PROGRESS NOTE  Date of Service: 03/13/2019  Paul Ball  [id number]  DOB: 10-18-1945   DOA: 02/15/2019  Referring Physician: [name], MD  HPI: Paul Ball is a 73 y.o. male seen for follow up of Acute on Chronic Respiratory Failure.  Patient did 3 hours on pressure support today during wean.  Satting well no fever or distress.  Medications: Reviewed on Rounds  Physical Exam:  Vitals: Pulse 79 respirations 18 BP 163/64 O2 sat 95% temp 98.8  Ventilator Settings ventilator mode AC VC rate of 16 tidal volume 420 PEEP of 7 with an FiO2 of 28%  . General: Comfortable at this time . Eyes: Grossly normal lids, irises & conjunctiva . ENT: grossly tongue is normal . Neck: no obvious mass . Cardiovascular: S1 S2 normal no gallop . Respiratory: No rales or rhonchi noted . Abdomen: soft . Skin: no rash seen on limited exam . Musculoskeletal: not rigid . Psychiatric:unable to assess . Neurologic: no seizure no involuntary movements         Lab Data:   Basic Metabolic Panel: Recent Labs  Lab 03/07/19 0749 03/07/19 1423 03/09/19 0502 03/11/19 0542  NA 129* 129* 129* 127*  K 4.0 3.8 3.8 3.8  CL 94* 91* 92* 93*  CO2 23  --  25 23  GLUCOSE 166* 127* 183* 165*  BUN 89* 102* 81* 73*  CREATININE 4.55* 4.70* 4.10* 3.71*  CALCIUM 7.9*  --  8.1* 7.9*  PHOS 3.5  --  3.6 2.8    ABG: Recent Labs  Lab 03/08/19 0950  PHART 7.421  PCO2ART 40.5  PO2ART 180*  HCO3 25.9  O2SAT 99.6    Liver Function Tests: Recent Labs  Lab 03/07/19 0749 03/09/19 0502 03/11/19 0542  ALBUMIN 1.5* 1.7* 1.6*   No results for input(s): LIPASE, AMYLASE in the last 168 hours. No results for input(s): AMMONIA in the last 168 hours.  CBC: Recent Labs  Lab 03/07/19 0749 03/07/19 1423 03/08/19 0536 03/09/19 0502 03/11/19 0542  WBC 7.6  --  8.4 7.8 7.4  HGB 7.0* 7.8* 11.2* 11.4* 10.4*   HCT 21.4* 23.0* 33.4* 34.3* 31.4*  MCV 83.9  --  85.9 86.6 87.2  PLT 183  --  172 169 194    Cardiac Enzymes: No results for input(s): CKTOTAL, CKMB, CKMBINDEX, TROPONINI in the last 168 hours.  BNP (last 3 results) No results for input(s): BNP in the last 8760 hours.  ProBNP (last 3 results) No results for input(s): PROBNP in the last 8760 hours.  Radiological Exams: Dg Chest 1 View  Result Date: 03/13/2019 CLINICAL DATA:  Follow-up pneumothorax, chest tube in place EXAM: CHEST  1 VIEW COMPARISON:  03/12/2019 FINDINGS: Tracheostomy in satisfactory position. Right IJ venous catheter terminates in the upper right atrium. Interstitial/airspace opacities bilaterally, suggesting moderate interstitial edema. Moderate left and small right pleural effusions. Indwelling right pigtail chest tube. Prior right pneumothorax has resolved. Cardiomegaly. IMPRESSION: Indwelling right pigtail chest tube. Prior right pneumothorax is resolved. Cardiomegaly with moderate interstitial edema. Moderate left and small right pleural effusions. Additional support apparatus as above. Electronically Signed   By: Julian Hy M.D.   On: 03/13/2019 13:28   Dg Chest 1 View  Result Date: 03/12/2019 CLINICAL DATA:  S/P Right thora. 1.5 liters removed EXAM: CHEST  1 VIEW COMPARISON:  the previous day's study FINDINGS: Evacuation of right pneumothorax. Small-moderate right pneumothorax, right lung apex projecting over  the posterior aspect right third rib. Mild interstitial and alveolar opacities bilaterally, left greater than right, marginally improved since previous. Heart size normal. No mediastinal shift. Right IJ dialysis catheter and endotracheal tube stable in position. There may be a small residual left pleural effusion. Visualized bones unremarkable. IMPRESSION: 1. Right pneumothorax post right thoracentesis. Electronically Signed   By: Lucrezia Europe M.D.   On: 03/12/2019 11:27   Ir Perc Pleural Drain W/indwell Cath  W/img Guide  Result Date: 03/12/2019 INDICATION: Moderate right pneumothorax after thoracentesis, intubated on positive pressure ventilation EXAM: RIGHT CHEST TUBE PLACEMENT UNDER FLUOROSCOPY MEDICATIONS: The patient is currently admitted to the hospital and receiving intravenous antibiotics. The antibiotics were administered within an appropriate time frame prior to the initiation of the procedure. ANESTHESIA/SEDATION: Intravenous Fentanyl 17mcg and Versed 1mg  were administered as conscious sedation during continuous monitoring of the patient's level of consciousness and physiological / cardiorespiratory status by the radiology RN, with a total moderate sedation time of 10 minutes. PROCEDURE: Informed written consent was obtained from the family after a thorough discussion of the procedural risks, benefits and alternatives. All questions were addressed. Maximal Sterile Barrier Technique was utilized including caps, mask, sterile gowns, sterile gloves, sterile drape, hand hygiene and skin antiseptic. A timeout was performed prior to the initiation of the procedure. The right lateral thorax was prepped with chlorhexidine, draped in usual sterile fashion, infiltrated with 1% lidocaine. An appropriate skin entry site was determined fluoroscopically 18 gauge percutaneous entry needle advanced into the pleural space. Air could be aspirated in an Amplatz wire advanced easily, position confirmed on fluoroscopy. Tract dilated to facilitate placement of a 14 French pigtail drain catheter, placed laterally directed towards the apex. Catheter was secured externally with 0 Prolene suture and StatLock and placed to Pleur-evac -20 cm H2O suction. The patient tolerated the procedure well. FLUOROSCOPY TIME:  0.2 minutes; 17  uGym2 DAP COMPLICATIONS: None immediate. IMPRESSION: 1. Technically successful right chest tube placement under fluoroscopy. Electronically Signed   By: Lucrezia Europe M.D.   On: 03/12/2019 16:15   US  Thoracentesis Asp Pleural Space W/img Guide  Result Date: 03/12/2019 INDICATION: Respiratory failure requiring tracheostomy and mechanical ventilation. Large right pleural effusion. Request for diagnostic and therapeutic thoracentesis. EXAM: ULTRASOUND GUIDED RIGHT THORACENTESIS MEDICATIONS: 1% lidocaine 10 mL COMPLICATIONS: SIR LEVEL B - Normal therapy, includes overnight admission for observation. PROCEDURE: An ultrasound guided thoracentesis was thoroughly discussed with the patient and questions answered. The benefits, risks, alternatives and complications were also discussed. The patient understands and wishes to proceed with the procedure. Written consent was obtained. Ultrasound was performed to localize and mark an adequate pocket of fluid in the right chest. The area was then prepped and draped in the normal sterile fashion. 1% Lidocaine was used for local anesthesia. Under ultrasound guidance a catheter was introduced. Thoracentesis was performed. The catheter was removed and a dressing applied. FINDINGS: A total of approximately 1.7 L of amber fluid was removed. Samples were sent to the laboratory as requested by the clinical team. IMPRESSION: Successful ultrasound guided right thoracentesis yielding 1.7 L of pleural fluid. Moderate pneumothorax on post-procedure chest x-ray. Given patient is on mechanical ventilation will proceed with placement of pigtail chest tube. Read by: Gareth Eagle, PA-C Electronically Signed   By: Lucrezia Europe M.D.   On: 03/12/2019 11:55    Assessment/Plan Active Problems:   Acute on chronic respiratory failure with hypoxia (HCC)   Acute on chronic systolic and diastolic heart failure, NYHA class 3 (  [place])   Acute renal failure superimposed on stage 3 chronic kidney disease (HCC)   Healthcare-associated pneumonia   COPD, severe ([place])   Duodenal ulcer   Anemia due to GI blood loss   1. Acute on chronic respiratory failure with hypoxia  patient was able tolerate 3 hours  her support today.  We will continue to wean per protocol.  Continue supportive measures and pulmonary toilet. 2. Acute on chronic systolic heart failure needs more aggressive fluid management 3. Acute on chronic renal failure per nephrology 4. Healthcare associated pneumonia treated 5. Severe COPD patient is at baseline we will continue present management   I have personally seen and evaluated the patient, evaluated laboratory and imaging results, formulated the assessment and plan and placed orders. The Patient requires high complexity decision making for assessment and support.  Case was discussed on Rounds with the Respiratory Therapy Staff  Allyne Gee, MD [facility] Pulmonary Critical Care Medicine Sleep Medicine

## 2019-03-13 NOTE — Progress Notes (Signed)
Supervising Physician: Arne Cleveland  Patient Status:  Paul Ball - In-pt  Chief Complaint:  Pneumothorax after Thoracentesis  Subjective:  Patient resting.  Allergies: Shellfish-derived products  Medications: Prior to Admission medications   Not on File     Vital Signs: BP 132/74    Pulse 79    Temp 97.8 F (36.6 C) (Temporal)    Resp (!) 26    SpO2 100%   Physical Exam Resting comfortably. Chest tube to suction. No obvious air leak. At least another liter of output. (clear yellow)  Imaging: Dg Chest 1 View  Result Date: 03/13/2019 CLINICAL DATA:  Follow-up pneumothorax, chest tube in place EXAM: CHEST  1 VIEW COMPARISON:  03/12/2019 FINDINGS: Tracheostomy in satisfactory position. Right IJ venous catheter terminates in the upper right atrium. Interstitial/airspace opacities bilaterally, suggesting moderate interstitial edema. Moderate left and small right pleural effusions. Indwelling right pigtail chest tube. Prior right pneumothorax has resolved. Cardiomegaly. IMPRESSION: Indwelling right pigtail chest tube. Prior right pneumothorax is resolved. Cardiomegaly with moderate interstitial edema. Moderate left and small right pleural effusions. Additional support apparatus as above. Electronically Signed   By: Julian Hy M.D.   On: 03/13/2019 13:28   Dg Chest 1 View  Result Date: 03/12/2019 CLINICAL DATA:  S/P Right thora. 1.5 liters removed EXAM: CHEST  1 VIEW COMPARISON:  the previous day's study FINDINGS: Evacuation of right pneumothorax. Small-moderate right pneumothorax, right lung apex projecting over the posterior aspect right third rib. Mild interstitial and alveolar opacities bilaterally, left greater than right, marginally improved since previous. Heart size normal. No mediastinal shift. Right IJ dialysis catheter and endotracheal tube stable in position. There may be a small residual left pleural effusion. Visualized bones unremarkable. IMPRESSION: 1. Right  pneumothorax post right thoracentesis. Electronically Signed   By: Lucrezia Europe M.D.   On: 03/12/2019 11:27   Dg Chest 1 View  Result Date: 03/11/2019 CLINICAL DATA:  S/p left thoracentesis EXAM: CHEST  1 VIEW COMPARISON:  Chest x-ray 03/11/2019 6:35 a.m. FINDINGS: Tracheostomy tube remains in place. Right sided central venous catheter in the upper right atrium as before. Following thoracentesis there is less density at the left lung base with improved aeration, presumed resolution of pleural fluid. Increased interstitial markings remain evident bilaterally. Heart size is enlarged as before. No acute bone findings. IMPRESSION: No pneumothorax status post left thoracentesis. Persistent increased interstitial markings, potentially pulmonary edema and right pleural effusion. Electronically Signed   By: Zetta Bills M.D.   On: 03/11/2019 11:22   Ct Chest Wo Contrast  Result Date: 03/10/2019 CLINICAL DATA:  Pleural effusion EXAM: CT CHEST WITHOUT CONTRAST TECHNIQUE: Multidetector CT imaging of the chest was performed following the standard protocol without IV contrast. COMPARISON:  None. FINDINGS: Cardiovascular: Heart size is enlarged. There is a well-positioned right-sided central venous catheter with tip terminating near the cavoatrial junction. Aortic calcifications are noted. The intracardiac blood pool is hypodense relative to the adjacent myocardium consistent with anemia. Coronary artery calcifications are noted. There is no significant pericardial effusion. Mediastinum/Nodes: --No mediastinal or hilar lymphadenopathy. --No axillary lymphadenopathy. --there is a pathologically enlarged left supraclavicular lymph node measuring approximately 2 cm (axial series 3, image 6). --Normal thyroid gland. --there is mild wall thickening of the distal esophagus. Lungs/Pleura: There are large bilateral pleural effusions with adjacent compressive atelectasis. There are diffuse interstitial lung markings bilaterally.  Evaluation of the lung fields is limited by respiratory motion artifact. There is a questionable trace right-sided pneumothorax (axial series 4, image  71). There is a questionable left-sided pneumothorax versus paraseptal emphysematous changes. There is a tracheostomy tube terminates above the carina. Upper Abdomen: The partially visualized upper abdomen demonstrates a gastrostomy tube that is only partially visualized. There is a small volume of ascites in the upper abdomen. Musculoskeletal: No chest wall abnormality. No acute or significant osseous findings. IMPRESSION: 1. Large bilateral pleural effusions with adjacent compressive atelectasis. 2. Questionable bilateral pneumothoraces, more suspicious on the right. These are poorly evaluated secondary to extensive motion artifact. Other differential considerations include paraseptal emphysematous changes. Short interval repeat chest x-ray is recommended to confirm stability of this finding. 3. Prominent interstitial lung markings may represent pulmonary edema or an atypical infectious process. 4. Cardiomegaly. 5. Pathologically enlarged left supraclavicular lymph node. 6. Ascites Aortic Atherosclerosis (ICD10-I70.0). Electronically Signed   By: Constance Holster M.D.   On: 03/10/2019 01:42   Dg Chest Port 1 View  Result Date: 03/11/2019 CLINICAL DATA:  Tracheostomy.  Pleural effusion. EXAM: PORTABLE CHEST 1 VIEW COMPARISON:  CT 03/10/2019.  Chest x-ray 03/08/2019. FINDINGS: Tracheostomy tube and right IJ line stable position. Heart size stable. Diffuse severe bilateral pulmonary infiltrates/edema again noted. Bilateral pleural effusions again noted. No pneumothorax noted as previously questioned on recent chest CT. Continued follow-up exams suggested for continued evaluation. IMPRESSION: 1.  Tracheostomy tube and right IJ line stable position. 2. Diffuse severe bilateral pulmonary infiltrates/edema and bilateral pleural effusions again noted without interim  change. 3. No pneumothorax noted as previously questioned on recent chest CT. Continued follow-up chest x-rays suggested for continued evaluation. Electronically Signed   By: Marcello Moores  Register   On: 03/11/2019 06:52   Ir Perc Pleural Drain Genia Harold Cath W/img Guide  Result Date: 03/12/2019 INDICATION: Moderate right pneumothorax after thoracentesis, intubated on positive pressure ventilation EXAM: RIGHT CHEST TUBE PLACEMENT UNDER FLUOROSCOPY MEDICATIONS: The patient is currently admitted to the hospital and receiving intravenous antibiotics. The antibiotics were administered within an appropriate time frame prior to the initiation of the procedure. ANESTHESIA/SEDATION: Intravenous Fentanyl 73mcg and Versed 1mg  were administered as conscious sedation during continuous monitoring of the patient's level of consciousness and physiological / cardiorespiratory status by the radiology RN, with a total moderate sedation time of 10 minutes. PROCEDURE: Informed written consent was obtained from the family after a thorough discussion of the procedural risks, benefits and alternatives. All questions were addressed. Maximal Sterile Barrier Technique was utilized including caps, mask, sterile gowns, sterile gloves, sterile drape, hand hygiene and skin antiseptic. A timeout was performed prior to the initiation of the procedure. The right lateral thorax was prepped with chlorhexidine, draped in usual sterile fashion, infiltrated with 1% lidocaine. An appropriate skin entry site was determined fluoroscopically 18 gauge percutaneous entry needle advanced into the pleural space. Air could be aspirated in an Amplatz wire advanced easily, position confirmed on fluoroscopy. Tract dilated to facilitate placement of a 14 French pigtail drain catheter, placed laterally directed towards the apex. Catheter was secured externally with 0 Prolene suture and StatLock and placed to Pleur-evac -20 cm H2O suction. The patient tolerated the  procedure well. FLUOROSCOPY TIME:  0.2 minutes; 17  uGym2 DAP COMPLICATIONS: None immediate. IMPRESSION: 1. Technically successful right chest tube placement under fluoroscopy. Electronically Signed   By: Lucrezia Europe M.D.   On: 03/12/2019 16:15   US Thoracentesis Asp Pleural Space W/img Guide  Result Date: 03/12/2019 INDICATION: Respiratory failure requiring tracheostomy and mechanical ventilation. Large right pleural effusion. Request for diagnostic and therapeutic thoracentesis. EXAM: ULTRASOUND GUIDED RIGHT THORACENTESIS MEDICATIONS: 1%  lidocaine 10 mL COMPLICATIONS: SIR LEVEL B - Normal therapy, includes overnight admission for observation. PROCEDURE: An ultrasound guided thoracentesis was thoroughly discussed with the patient and questions answered. The benefits, risks, alternatives and complications were also discussed. The patient understands and wishes to proceed with the procedure. Written consent was obtained. Ultrasound was performed to localize and mark an adequate pocket of fluid in the right chest. The area was then prepped and draped in the normal sterile fashion. 1% Lidocaine was used for local anesthesia. Under ultrasound guidance a catheter was introduced. Thoracentesis was performed. The catheter was removed and a dressing applied. FINDINGS: A total of approximately 1.7 L of amber fluid was removed. Samples were sent to the laboratory as requested by the clinical team. IMPRESSION: Successful ultrasound guided right thoracentesis yielding 1.7 L of pleural fluid. Moderate pneumothorax on post-procedure chest x-ray. Given patient is on mechanical ventilation will proceed with placement of pigtail chest tube. Read by: Gareth Eagle, PA-C Electronically Signed   By: Lucrezia Europe M.D.   On: 03/12/2019 11:55   US Thoracentesis Asp Pleural Space W/img Guide  Result Date: 03/11/2019 INDICATION: Acute on chronic respiratory failure. Bilateral pleural effusions. Request for diagnostic and therapeutic left  thoracentesis. EXAM: ULTRASOUND GUIDED LEFT THORACENTESIS MEDICATIONS: 1% lidocaine 10 mL COMPLICATIONS: None immediate. PROCEDURE: An ultrasound guided thoracentesis was thoroughly discussed with the patient and questions answered. The benefits, risks, alternatives and complications were also discussed. The patient understands and wishes to proceed with the procedure. Written consent was obtained. Ultrasound was performed to localize and mark an adequate pocket of fluid in the left chest. The area was then prepped and draped in the normal sterile fashion. 1% Lidocaine was used for local anesthesia. Under ultrasound guidance a 6 Fr Safe-T-Centesis catheter was introduced. Thoracentesis was performed. The catheter was removed and a dressing applied. FINDINGS: A total of approximately 1.35 L of clear amber fluid was removed. Samples were sent to the laboratory as requested by the clinical team. IMPRESSION: Successful ultrasound guided left thoracentesis yielding 1.35 L of pleural fluid. No pneumothorax on post-procedure chest x-ray. Read by: Gareth Eagle, PA-C Electronically Signed   By: Aletta Edouard M.D.   On: 03/11/2019 12:23    Labs:  CBC: Recent Labs    03/07/19 0749 03/07/19 1423 03/08/19 0536 03/09/19 0502 03/11/19 0542  WBC 7.6  --  8.4 7.8 7.4  HGB 7.0* 7.8* 11.2* 11.4* 10.4*  HCT 21.4* 23.0* 33.4* 34.3* 31.4*  PLT 183  --  172 169 194    COAGS: Recent Labs    02/16/19 0658  INR 1.3*    BMP: Recent Labs    03/04/19 0542 03/07/19 0749 03/07/19 1423 03/09/19 0502 03/11/19 0542  NA 133* 129* 129* 129* 127*  K 3.8 4.0 3.8 3.8 3.8  CL 96* 94* 91* 92* 93*  CO2 24 23  --  25 23  GLUCOSE 176* 166* 127* 183* 165*  BUN 83* 89* 102* 81* 73*  CALCIUM 8.3* 7.9*  --  8.1* 7.9*  CREATININE 4.75* 4.55* 4.70* 4.10* 3.71*  GFRNONAA 11* 12*  --  14* 15*  GFRAA 13* 14*  --  16* 18*    LIVER FUNCTION TESTS: Recent Labs    02/16/19 0658  03/04/19 0542 03/07/19 0749 03/09/19 0502  03/11/19 0542  BILITOT 1.1  --   --   --   --   --   AST 21  --   --   --   --   --  ALT 15  --   --   --   --   --   ALKPHOS 112  --   --   --   --   --   PROT 6.0*  --   --   --   --   --   ALBUMIN 1.9*   < > 1.5* 1.5* 1.7* 1.6*   < > = values in this interval not displayed.    Assessment and Plan:  Iatrogenic pneumothorax  CXR with resolution of pneumothorax.  Will place to water seal today.  Repeat CXR in am. Hopefully remove tube tomorrow.  Electronically Signed: Murrell Redden, PA-C 03/13/2019, 2:17 PM    I spent a total of 15 Minutes at the the patient's bedside AND on the patient's hospital floor or unit, greater than 50% of which was counseling/coordinating care for f/u chest tube.

## 2019-03-14 ENCOUNTER — Other Ambulatory Visit (HOSPITAL_COMMUNITY): Payer: Medicare Other

## 2019-03-14 ENCOUNTER — Encounter
Admission: RE | Disposition: A | Discharge status: Skilled Nursing Facility | Payer: Self-pay | Source: Other Acute Inpatient Hospital | Attending: Internal Medicine

## 2019-03-14 ENCOUNTER — Encounter (HOSPITAL_COMMUNITY): Payer: Self-pay | Admitting: Certified Registered"

## 2019-03-14 ENCOUNTER — Encounter (HOSPITAL_COMMUNITY): Payer: Medicare Other | Admitting: Certified Registered"

## 2019-03-14 ENCOUNTER — Inpatient Hospital Stay: Admit: 2019-03-14 | Payer: Medicare Other | Admitting: Vascular Surgery

## 2019-03-14 ENCOUNTER — Encounter (HOSPITAL_COMMUNITY): Payer: Self-pay | Admitting: Radiology

## 2019-03-14 DIAGNOSIS — N179 Acute kidney failure, unspecified: Secondary | ICD-10-CM | POA: Diagnosis not present

## 2019-03-14 DIAGNOSIS — I5043 Acute on chronic combined systolic (congestive) and diastolic (congestive) heart failure: Secondary | ICD-10-CM | POA: Diagnosis not present

## 2019-03-14 DIAGNOSIS — J449 Chronic obstructive pulmonary disease, unspecified: Secondary | ICD-10-CM | POA: Diagnosis not present

## 2019-03-14 DIAGNOSIS — J9621 Acute and chronic respiratory failure with hypoxia: Secondary | ICD-10-CM | POA: Diagnosis not present

## 2019-03-14 LAB — PH, BODY FLUID
pH, Body Fluid: 7.8
pH, Body Fluid: 7.9

## 2019-03-14 LAB — CBC
HCT: 30.8 % — ABNORMAL LOW (ref 39.0–52.0)
Hemoglobin: 10.2 g/dL — ABNORMAL LOW (ref 13.0–17.0)
MCH: 28.7 pg (ref 26.0–34.0)
MCHC: 33.1 g/dL (ref 30.0–36.0)
MCV: 86.5 fL (ref 80.0–100.0)
Platelets: 197 10*3/uL (ref 150–400)
RBC: 3.56 MIL/uL — ABNORMAL LOW (ref 4.22–5.81)
RDW: 15.5 % (ref 11.5–15.5)
WBC: 7.9 10*3/uL (ref 4.0–10.5)
nRBC: 0 % (ref 0.0–0.2)

## 2019-03-14 LAB — RENAL FUNCTION PANEL
Albumin: 1.5 g/dL — ABNORMAL LOW (ref 3.5–5.0)
Anion gap: 12 (ref 5–15)
BUN: 82 mg/dL — ABNORMAL HIGH (ref 8–23)
CO2: 23 mmol/L (ref 22–32)
Calcium: 8.3 mg/dL — ABNORMAL LOW (ref 8.9–10.3)
Chloride: 92 mmol/L — ABNORMAL LOW (ref 98–111)
Creatinine, Ser: 3.85 mg/dL — ABNORMAL HIGH (ref 0.61–1.24)
GFR calc Af Amer: 17 mL/min — ABNORMAL LOW (ref >60)
GFR calc non Af Amer: 15 mL/min — ABNORMAL LOW (ref >60)
Glucose, Bld: 141 mg/dL — ABNORMAL HIGH (ref 70–99)
Phosphorus: 2.8 mg/dL (ref 2.5–4.6)
Potassium: 3.8 mmol/L (ref 3.5–5.1)
Sodium: 127 mmol/L — ABNORMAL LOW (ref 135–145)

## 2019-03-14 LAB — PROTIME-INR
INR: 1.4 — ABNORMAL HIGH (ref 0.8–1.2)
Prothrombin Time: 16.7 seconds — ABNORMAL HIGH (ref 11.4–15.2)

## 2019-03-14 LAB — CYTOLOGY - NON PAP

## 2019-03-14 SURGERY — INSERTION OF DIALYSIS CATHETER
Anesthesia: Choice

## 2019-03-14 SURGERY — INSERTION OF DIALYSIS CATHETER
Anesthesia: General | Site: Neck

## 2019-03-14 MED ORDER — MIDAZOLAM HCL 2 MG/2ML IJ SOLN
INTRAMUSCULAR | Status: AC
  Filled 2019-03-14: qty 2

## 2019-03-14 MED ORDER — PROPOFOL 10 MG/ML IV BOLUS
INTRAVENOUS | Status: AC
  Filled 2019-03-14: qty 20

## 2019-03-14 MED ORDER — FENTANYL CITRATE (PF) 250 MCG/5ML IJ SOLN
INTRAMUSCULAR | Status: AC
  Filled 2019-03-14: qty 5

## 2019-03-14 NOTE — Consult Note (Signed)
Chief Complaint: Patient was seen in consultation today for tunneled dialysis catheter placement at the request of Dr Driscilla Moats  Supervising Physician: Daryll Brod  Patient Status: [facility]  History of Present Illness: Paul Ball is a 73 y.o. male   BPH; COPD; DM; HTN Acute resp failure; CHF CKD 3 To [facility] for management for respiratory failure  Critically ill ESRD Temp cath placed in IR 02/19/19 In use now Needs tunneled catheter Scheduled for conversion/placement in IR 11/10    Past Medical History:  Diagnosis Date   Acute on chronic respiratory failure with hypoxia (HCC)    Acute on chronic systolic and diastolic heart failure, NYHA class 3 ([place])    Acute renal failure superimposed on stage 3 chronic kidney disease ([place])    COPD, severe ([place])    Healthcare-associated pneumonia     Past Surgical History:  Procedure Laterality Date   BIOPSY  03/07/2019   Procedure: BIOPSY;  Surgeon: Thornton Park, MD;  Location: [facility];  Service: Gastroenterology;;   ESOPHAGOGASTRODUODENOSCOPY N/A 03/07/2019   Procedure: ESOPHAGOGASTRODUODENOSCOPY (EGD);  Surgeon: Thornton Park, MD;  Location: [facility];  Service: Gastroenterology;  Laterality: N/A;   IR FLUORO GUIDE CV LINE RIGHT  02/19/2019   IR PERC PLEURAL DRAIN W/INDWELL CATH W/IMG GUIDE  03/12/2019   IR US GUIDE VASC ACCESS RIGHT  02/19/2019    Allergies: Shellfish-derived products  Medications: Prior to Admission medications   Not on File     History reviewed. No pertinent family history.  Social History   Socioeconomic History   Marital status: Married    Spouse name: Not on file   Number of children: Not on file   Years of education: Not on file   Highest education level: Not on file  Occupational History   Not on file  Social Needs   Financial resource strain: Not on file   Food insecurity    Worry: Not on file    Inability: Not on file   Transportation needs   Medical: Not on file    Non-medical: Not on file  Tobacco Use   Smoking status: Not on file  Substance and Sexual Activity   Alcohol use: Not on file   Drug use: Not on file   Sexual activity: Not on file  Lifestyle   Physical activity    Days per week: Not on file    Minutes per session: Not on file   Stress: Not on file  Relationships   Social connections    Talks on phone: Not on file    Gets together: Not on file    Attends religious service: Not on file    Active member of club or organization: Not on file    Attends meetings of clubs or organizations: Not on file    Relationship status: Not on file  Other Topics Concern   Not on file  Social History Narrative   Not on file    Review of Systems: A 12 point ROS discussed and pertinent positives are indicated in the HPI above.  All other systems are negative.   Vital Signs: BP 132/74    Pulse 79    Temp 97.8 F (36.6 C) (Temporal)    Resp (!) 26    SpO2 100%   Physical Exam Constitutional:      Comments: Vent/trach  Musculoskeletal:     Comments: No response  Skin:    General: Skin is warm and dry.     Comments: Rt IJ  temp cath in place In use  Psychiatric:     Comments: Spoke to Dtr Melodie via phone-- consents for procedure     Imaging: Dg Chest 1 View  Result Date: 03/13/2019 CLINICAL DATA:  Follow-up pneumothorax, chest tube in place EXAM: CHEST  1 VIEW COMPARISON:  03/12/2019 FINDINGS: Tracheostomy in satisfactory position. Right IJ venous catheter terminates in the upper right atrium. Interstitial/airspace opacities bilaterally, suggesting moderate interstitial edema. Moderate left and small right pleural effusions. Indwelling right pigtail chest tube. Prior right pneumothorax has resolved. Cardiomegaly. IMPRESSION: Indwelling right pigtail chest tube. Prior right pneumothorax is resolved. Cardiomegaly with moderate interstitial edema. Moderate left and small right pleural effusions. Additional  support apparatus as above. Electronically Signed   By: Julian Hy M.D.   On: 03/13/2019 13:28   Dg Chest 1 View  Result Date: 03/12/2019 CLINICAL DATA:  S/P Right thora. 1.5 liters removed EXAM: CHEST  1 VIEW COMPARISON:  the previous day's study FINDINGS: Evacuation of right pneumothorax. Small-moderate right pneumothorax, right lung apex projecting over the posterior aspect right third rib. Mild interstitial and alveolar opacities bilaterally, left greater than right, marginally improved since previous. Heart size normal. No mediastinal shift. Right IJ dialysis catheter and endotracheal tube stable in position. There may be a small residual left pleural effusion. Visualized bones unremarkable. IMPRESSION: 1. Right pneumothorax post right thoracentesis. Electronically Signed   By: Lucrezia Europe M.D.   On: 03/12/2019 11:27   Dg Chest 1 View  Result Date: 03/11/2019 CLINICAL DATA:  S/p left thoracentesis EXAM: CHEST  1 VIEW COMPARISON:  Chest x-ray 03/11/2019 6:35 a.m. FINDINGS: Tracheostomy tube remains in place. Right sided central venous catheter in the upper right atrium as before. Following thoracentesis there is less density at the left lung base with improved aeration, presumed resolution of pleural fluid. Increased interstitial markings remain evident bilaterally. Heart size is enlarged as before. No acute bone findings. IMPRESSION: No pneumothorax status post left thoracentesis. Persistent increased interstitial markings, potentially pulmonary edema and right pleural effusion. Electronically Signed   By: Zetta Bills M.D.   On: 03/11/2019 11:22   Dg Abd 1 View  Result Date: 02/15/2019 CLINICAL DATA:  73 year old male with respiratory failure. EXAM: PORTABLE CHEST 1 VIEW COMPARISON:  None. FINDINGS: Tracheostomy with tip approximately 6 cm above the carina. There are small to moderate bilateral pleural effusions with associated bilateral lower lobe atelectasis versus infiltrate. Mild  diffuse interstitial prominence may represent vascular congestion. There is no pneumothorax. There is mild cardiomegaly. Percutaneous gastrostomy with tip over the body of the stomach. Injected contrast is noted in the second portion of the duodenum as well as within the colon. The osseous structures and soft tissues are grossly unremarkable. There is silhouetting of the abdominal structures which may represent ascites. IMPRESSION: 1. Small to moderate bilateral pleural effusions with associated bilateral lower lobe atelectasis versus infiltrate. 2. Mild cardiomegaly with mild vascular congestion. 3. Percutaneous gastrostomy with tip over the body of the stomach. Electronically Signed   By: Anner Crete M.D.   On: 02/15/2019 19:59   Ct Chest Wo Contrast  Result Date: 03/10/2019 CLINICAL DATA:  Pleural effusion EXAM: CT CHEST WITHOUT CONTRAST TECHNIQUE: Multidetector CT imaging of the chest was performed following the standard protocol without IV contrast. COMPARISON:  None. FINDINGS: Cardiovascular: Heart size is enlarged. There is a well-positioned right-sided central venous catheter with tip terminating near the cavoatrial junction. Aortic calcifications are noted. The intracardiac blood pool is hypodense relative to the adjacent myocardium consistent  with anemia. Coronary artery calcifications are noted. There is no significant pericardial effusion. Mediastinum/Nodes: --No mediastinal or hilar lymphadenopathy. --No axillary lymphadenopathy. --there is a pathologically enlarged left supraclavicular lymph node measuring approximately 2 cm (axial series 3, image 6). --Normal thyroid gland. --there is mild wall thickening of the distal esophagus. Lungs/Pleura: There are large bilateral pleural effusions with adjacent compressive atelectasis. There are diffuse interstitial lung markings bilaterally. Evaluation of the lung fields is limited by respiratory motion artifact. There is a questionable trace  right-sided pneumothorax (axial series 4, image 46). There is a questionable left-sided pneumothorax versus paraseptal emphysematous changes. There is a tracheostomy tube terminates above the carina. Upper Abdomen: The partially visualized upper abdomen demonstrates a gastrostomy tube that is only partially visualized. There is a small volume of ascites in the upper abdomen. Musculoskeletal: No chest wall abnormality. No acute or significant osseous findings. IMPRESSION: 1. Large bilateral pleural effusions with adjacent compressive atelectasis. 2. Questionable bilateral pneumothoraces, more suspicious on the right. These are poorly evaluated secondary to extensive motion artifact. Other differential considerations include paraseptal emphysematous changes. Short interval repeat chest x-ray is recommended to confirm stability of this finding. 3. Prominent interstitial lung markings may represent pulmonary edema or an atypical infectious process. 4. Cardiomegaly. 5. Pathologically enlarged left supraclavicular lymph node. 6. Ascites Aortic Atherosclerosis (ICD10-I70.0). Electronically Signed   By: Constance Holster M.D.   On: 03/10/2019 01:42   US Renal  Result Date: 02/17/2019 CLINICAL DATA:  Acute kidney injury. EXAM: RENAL / URINARY TRACT ULTRASOUND COMPLETE COMPARISON:  None. FINDINGS: Right Kidney: Renal measurements: 9.6 x 4.4 x 3.8 cm = volume: 85 mL. Increased echogenicity of renal parenchyma is noted. No mass or hydronephrosis visualized. Left Kidney: Renal measurements: 10.0 x 5.0 x 4.9 cm = volume: 127 mL. Increased echogenicity of renal parenchyma is noted. No mass or hydronephrosis visualized. Bladder: Not visualized as bladder is decompressed secondary to catheter. Other: Mild ascites is noted. IMPRESSION: Increased echogenicity of renal parenchyma is noted bilaterally suggesting medical renal disease. No other renal abnormality is noted. Mild ascites. Electronically Signed   By: Marijo Conception  M.D.   On: 02/17/2019 08:50   Ir Fluoro Guide Cv Line Right  Result Date: 02/19/2019 INDICATION: 73 year old with renal failure and request for a hemodialysis catheter. EXAM: FLUOROSCOPIC AND ULTRASOUND GUIDED PLACEMENT OF A NON-TUNNELED DIALYSIS CATHETER Physician: Stephan Minister. Henn, MD MEDICATIONS: None ANESTHESIA/SEDATION: None FLUOROSCOPY TIME:  Fluoroscopy Time: 30 seconds, 1.7 mGy COMPLICATIONS: None immediate. PROCEDURE: Informed consent was obtained for catheter placement. The patient was placed supine on the interventional table. Ultrasound confirmed a patent right internal jugular vein. Ultrasound images were obtained for documentation. The right side of the neck was prepped and draped in a sterile fashion. The right neck was anesthetized with 1% lidocaine. Maximal barrier sterile technique was utilized including caps, mask, sterile gowns, sterile gloves, sterile drape, hand hygiene and skin antiseptic. A small incision was made with #11 blade scalpel. A 21 gauge needle directed into the right internal jugular vein with ultrasound guidance. A micropuncture dilator set was placed. A 20 cm Mahurkar catheter was selected. The catheter was advanced over a wire and positioned in the lower SVC. Fluoroscopic images were obtained for documentation. Both dialysis lumens were found to aspirate and flush well. The proper amount of heparin was flushed in both lumens. The central venous lumen was flushed with normal saline. Catheter was sutured to skin. FINDINGS: Catheter tip in the lower SVC. IMPRESSION: Successful placement of a right  jugular non-tunneled dialysis catheter using ultrasound and fluoroscopic guidance. Electronically Signed   By: Markus Daft M.D.   On: 02/19/2019 15:44   Ir US Guide Vasc Access Right  Result Date: 02/19/2019 INDICATION: 73 year old with renal failure and request for a hemodialysis catheter. EXAM: FLUOROSCOPIC AND ULTRASOUND GUIDED PLACEMENT OF A NON-TUNNELED DIALYSIS CATHETER  Physician: Stephan Minister. Henn, MD MEDICATIONS: None ANESTHESIA/SEDATION: None FLUOROSCOPY TIME:  Fluoroscopy Time: 30 seconds, 1.7 mGy COMPLICATIONS: None immediate. PROCEDURE: Informed consent was obtained for catheter placement. The patient was placed supine on the interventional table. Ultrasound confirmed a patent right internal jugular vein. Ultrasound images were obtained for documentation. The right side of the neck was prepped and draped in a sterile fashion. The right neck was anesthetized with 1% lidocaine. Maximal barrier sterile technique was utilized including caps, mask, sterile gowns, sterile gloves, sterile drape, hand hygiene and skin antiseptic. A small incision was made with #11 blade scalpel. A 21 gauge needle directed into the right internal jugular vein with ultrasound guidance. A micropuncture dilator set was placed. A 20 cm Mahurkar catheter was selected. The catheter was advanced over a wire and positioned in the lower SVC. Fluoroscopic images were obtained for documentation. Both dialysis lumens were found to aspirate and flush well. The proper amount of heparin was flushed in both lumens. The central venous lumen was flushed with normal saline. Catheter was sutured to skin. FINDINGS: Catheter tip in the lower SVC. IMPRESSION: Successful placement of a right jugular non-tunneled dialysis catheter using ultrasound and fluoroscopic guidance. Electronically Signed   By: Markus Daft M.D.   On: 02/19/2019 15:44   Dg Chest Port 1 View  Result Date: 03/14/2019 CLINICAL DATA:  Pneumothorax. Chest tube in situ. EXAM: PORTABLE CHEST 1 VIEW COMPARISON:  One-view chest x-ray 03/13/2019 FINDINGS: A tiny apical pneumothorax is evident on today's study. Tracheostomy tube is stable. Right IJ line is stable. The heart is mildly enlarged. Atherosclerotic changes are noted at the aorta. A shifting interstitial pattern is consistent with edema or ARDS. Marland Kitchen IMPRESSION: 1. Tiny right apical pneumothorax has recurred.  2. Increasing interstitial pattern compatible with edema or ARDS. 3. Stable tracheostomy tube and right IJ line. Electronically Signed   By: San Morelle M.D.   On: 03/14/2019 07:38   Dg Chest Port 1 View  Result Date: 03/11/2019 CLINICAL DATA:  Tracheostomy.  Pleural effusion. EXAM: PORTABLE CHEST 1 VIEW COMPARISON:  CT 03/10/2019.  Chest x-ray 03/08/2019. FINDINGS: Tracheostomy tube and right IJ line stable position. Heart size stable. Diffuse severe bilateral pulmonary infiltrates/edema again noted. Bilateral pleural effusions again noted. No pneumothorax noted as previously questioned on recent chest CT. Continued follow-up exams suggested for continued evaluation. IMPRESSION: 1.  Tracheostomy tube and right IJ line stable position. 2. Diffuse severe bilateral pulmonary infiltrates/edema and bilateral pleural effusions again noted without interim change. 3. No pneumothorax noted as previously questioned on recent chest CT. Continued follow-up chest x-rays suggested for continued evaluation. Electronically Signed   By: Marcello Moores  Register   On: 03/11/2019 06:52   Dg Chest Port 1 View  Result Date: 03/08/2019 CLINICAL DATA:  Congestive heart failure. EXAM: PORTABLE CHEST 1 VIEW COMPARISON:  Chest x-rays dated 03/02/2019 and 02/24/2019. FINDINGS: Tracheostomy tube remains appropriately positioned in the midline. RIGHT IJ central line appears well positioned with tip at the level of the mid/lower SVC. Stable diffuse bilateral airspace opacities, incompletely imaged at the lung bases on today's exam. Probable bilateral pleural effusions, small to moderate in size. No pneumothorax seen.  IMPRESSION: 1. Stable diffuse bilateral airspace opacities, compatible with pulmonary edema pattern, incompletely imaged at the lung bases on today's exam. 2. Probable small to moderate bilateral pleural effusions. Electronically Signed   By: Franki Cabot M.D.   On: 03/08/2019 10:02   Dg Chest Port 1 View  Result  Date: 03/02/2019 CLINICAL DATA:  Shortness of breath EXAM: PORTABLE CHEST 1 VIEW COMPARISON:  02/24/2019 FINDINGS: Diffuse bilateral airspace disease and moderate layering effusions, similar to prior study. Tracheostomy and right Vas-Cath unchanged. IMPRESSION: No significant change diffuse bilateral airspace disease and effusions. Electronically Signed   By: Rolm Baptise M.D.   On: 03/02/2019 08:54   Dg Chest Port 1 View  Result Date: 02/24/2019 CLINICAL DATA:  Pneumonia. EXAM: PORTABLE CHEST 1 VIEW COMPARISON:  February 21, 2019. FINDINGS: Stable cardiomediastinal silhouette. Tracheostomy tube is unchanged position. Right internal jugular catheter is unchanged in position. No pneumothorax is noted. Stable bilateral lung opacities are noted concerning for edema or pneumonia with associated pleural effusions. Bony thorax is unremarkable. IMPRESSION: Stable support apparatus. Stable bilateral lung opacities and pleural effusions as described above. Electronically Signed   By: Marijo Conception M.D.   On: 02/24/2019 07:41   Dg Chest Port 1 View  Result Date: 02/21/2019 CLINICAL DATA:  Pneumonia. Ventilator dependent. Respiratory failure. EXAM: PORTABLE CHEST 1 VIEW COMPARISON:  02/16/2019 FINDINGS: Tracheostomy tube tip at the thoracic inlet. Right internal jugular dialysis catheter tip at the atrial caval junction. Presumed overlying artifact projects over the lower chest in the dialysis catheter tip. Unchanged cardiomegaly and mediastinal contours. Heterogeneous bilateral lung opacities with bilateral pleural effusions, not significantly changed. No visualized pneumothorax. IMPRESSION: 1. Unchanged appearance of the chest at the past 3 days. 2. Unchanged bilateral lung opacities and pleural effusions. Electronically Signed   By: Keith Rake M.D.   On: 02/21/2019 06:43   Dg Chest Port 1 View  Result Date: 02/16/2019 CLINICAL DATA:  Pleural effusion. EXAM: PORTABLE CHEST 1 VIEW COMPARISON:  February 15, 2019. FINDINGS: Stable cardiomediastinal silhouette. Tracheostomy tube is unchanged in position. Stable bilateral lung opacities are noted concerning for edema or infiltrates with associated pleural effusions, right greater than left. Bony thorax is unremarkable. IMPRESSION: Stable bilateral lung opacities are noted concerning for edema or infiltrates with associated pleural effusions, right greater than left. Electronically Signed   By: Marijo Conception M.D.   On: 02/16/2019 12:53   Dg Chest Port 1 View  Result Date: 02/15/2019 CLINICAL DATA:  73 year old male with respiratory failure. EXAM: PORTABLE CHEST 1 VIEW COMPARISON:  None. FINDINGS: Tracheostomy with tip approximately 6 cm above the carina. There are small to moderate bilateral pleural effusions with associated bilateral lower lobe atelectasis versus infiltrate. Mild diffuse interstitial prominence may represent vascular congestion. There is no pneumothorax. There is mild cardiomegaly. Percutaneous gastrostomy with tip over the body of the stomach. Injected contrast is noted in the second portion of the duodenum as well as within the colon. The osseous structures and soft tissues are grossly unremarkable. There is silhouetting of the abdominal structures which may represent ascites. IMPRESSION: 1. Small to moderate bilateral pleural effusions with associated bilateral lower lobe atelectasis versus infiltrate. 2. Mild cardiomegaly with mild vascular congestion. 3. Percutaneous gastrostomy with tip over the body of the stomach. Electronically Signed   By: Anner Crete M.D.   On: 02/15/2019 19:59   Ir Perc Pleural Drain W/indwell Cath W/img Guide  Result Date: 03/12/2019 INDICATION: Moderate right pneumothorax after thoracentesis, intubated on positive pressure  ventilation EXAM: RIGHT CHEST TUBE PLACEMENT UNDER FLUOROSCOPY MEDICATIONS: The patient is currently admitted to the hospital and receiving intravenous antibiotics. The antibiotics were  administered within an appropriate time frame prior to the initiation of the procedure. ANESTHESIA/SEDATION: Intravenous Fentanyl 89mcg and Versed 1mg  were administered as conscious sedation during continuous monitoring of the patient's level of consciousness and physiological / cardiorespiratory status by the radiology RN, with a total moderate sedation time of 10 minutes. PROCEDURE: Informed written consent was obtained from the family after a thorough discussion of the procedural risks, benefits and alternatives. All questions were addressed. Maximal Sterile Barrier Technique was utilized including caps, mask, sterile gowns, sterile gloves, sterile drape, hand hygiene and skin antiseptic. A timeout was performed prior to the initiation of the procedure. The right lateral thorax was prepped with chlorhexidine, draped in usual sterile fashion, infiltrated with 1% lidocaine. An appropriate skin entry site was determined fluoroscopically 18 gauge percutaneous entry needle advanced into the pleural space. Air could be aspirated in an Amplatz wire advanced easily, position confirmed on fluoroscopy. Tract dilated to facilitate placement of a 14 French pigtail drain catheter, placed laterally directed towards the apex. Catheter was secured externally with 0 Prolene suture and StatLock and placed to Pleur-evac -20 cm H2O suction. The patient tolerated the procedure well. FLUOROSCOPY TIME:  0.2 minutes; 17  uGym2 DAP COMPLICATIONS: None immediate. IMPRESSION: 1. Technically successful right chest tube placement under fluoroscopy. Electronically Signed   By: Lucrezia Europe M.D.   On: 03/12/2019 16:15   US Thoracentesis Asp Pleural Space W/img Guide  Result Date: 03/12/2019 INDICATION: Respiratory failure requiring tracheostomy and mechanical ventilation. Large right pleural effusion. Request for diagnostic and therapeutic thoracentesis. EXAM: ULTRASOUND GUIDED RIGHT THORACENTESIS MEDICATIONS: 1% lidocaine 10 mL  COMPLICATIONS: SIR LEVEL B - Normal therapy, includes overnight admission for observation. PROCEDURE: An ultrasound guided thoracentesis was thoroughly discussed with the patient and questions answered. The benefits, risks, alternatives and complications were also discussed. The patient understands and wishes to proceed with the procedure. Written consent was obtained. Ultrasound was performed to localize and mark an adequate pocket of fluid in the right chest. The area was then prepped and draped in the normal sterile fashion. 1% Lidocaine was used for local anesthesia. Under ultrasound guidance a catheter was introduced. Thoracentesis was performed. The catheter was removed and a dressing applied. FINDINGS: A total of approximately 1.7 L of amber fluid was removed. Samples were sent to the laboratory as requested by the clinical team. IMPRESSION: Successful ultrasound guided right thoracentesis yielding 1.7 L of pleural fluid. Moderate pneumothorax on post-procedure chest x-ray. Given patient is on mechanical ventilation will proceed with placement of pigtail chest tube. Read by: Gareth Eagle, PA-C Electronically Signed   By: Lucrezia Europe M.D.   On: 03/12/2019 11:55   US Thoracentesis Asp Pleural Space W/img Guide  Result Date: 03/11/2019 INDICATION: Acute on chronic respiratory failure. Bilateral pleural effusions. Request for diagnostic and therapeutic left thoracentesis. EXAM: ULTRASOUND GUIDED LEFT THORACENTESIS MEDICATIONS: 1% lidocaine 10 mL COMPLICATIONS: None immediate. PROCEDURE: An ultrasound guided thoracentesis was thoroughly discussed with the patient and questions answered. The benefits, risks, alternatives and complications were also discussed. The patient understands and wishes to proceed with the procedure. Written consent was obtained. Ultrasound was performed to localize and mark an adequate pocket of fluid in the left chest. The area was then prepped and draped in the normal sterile fashion.  1% Lidocaine was used for local anesthesia. Under ultrasound guidance a 6 Fr Safe-T-Centesis  catheter was introduced. Thoracentesis was performed. The catheter was removed and a dressing applied. FINDINGS: A total of approximately 1.35 L of clear amber fluid was removed. Samples were sent to the laboratory as requested by the clinical team. IMPRESSION: Successful ultrasound guided left thoracentesis yielding 1.35 L of pleural fluid. No pneumothorax on post-procedure chest x-ray. Read by: Gareth Eagle, PA-C Electronically Signed   By: Aletta Edouard M.D.   On: 03/11/2019 12:23   US Thoracentesis Asp Pleural Space W/img Guide  Result Date: 03/09/2019 INDICATION: Patient with history of acute on chronic respiratory failure s/p tracheostomy placement, dyspnea, and bilateral pleural effusions. Request made for therapeutic right thoracentesis. EXAM: ULTRASOUND GUIDED THERAPEUTIC RIGHT THORACENTESIS (UNSUCCESSFUL) MEDICATIONS: 20 mL 1% lidocaine COMPLICATIONS: None immediate. PROCEDURE: An ultrasound guided thoracentesis was thoroughly discussed with the patient and questions answered. The benefits, risks, alternatives and complications were also discussed. The patient understands and wishes to proceed with the procedure. Written consent was obtained. Ultrasound was performed to localize and mark an adequate pocket of fluid in the right chest. The area was then prepped and draped in the normal sterile fashion. 1% Lidocaine was used for local anesthesia. Even though there was an adequate amount of fluid and despite multiple attempts under ultrasound guidance using both a 6 Fr Safe-T-Centesis along with a 19 gauge, 10 cm, Yueh, no fluid was aspirated due to thickened pleura. Because of this, procedure was aborted. Band-aid was applied to site. FINDINGS: Right pleural effusion by ultrasound. Unsuccessful attempt at right thoracentesis. IMPRESSION: Unsuccessful right thoracentesis at bedside secondary to thickened pleura.  Procedure aborted. Recommend CT chest with contrast for further evaluation of pleural fluid, and possible repeat thoracentesis if indicated. Read by: Earley Abide, PA-C Electronically Signed   By: Sandi Mariscal M.D.   On: 03/09/2019 15:09    Labs:  CBC: Recent Labs    03/08/19 0536 03/09/19 0502 03/11/19 0542 03/14/19 0611  WBC 8.4 7.8 7.4 7.9  HGB 11.2* 11.4* 10.4* 10.2*  HCT 33.4* 34.3* 31.4* 30.8*  PLT 172 169 194 197    COAGS: Recent Labs    02/16/19 0658 03/14/19 0719  INR 1.3* 1.4*    BMP: Recent Labs    03/07/19 0749 03/07/19 1423 03/09/19 0502 03/11/19 0542 03/14/19 0611  NA 129* 129* 129* 127* 127*  K 4.0 3.8 3.8 3.8 3.8  CL 94* 91* 92* 93* 92*  CO2 23  --  25 23 23   GLUCOSE 166* 127* 183* 165* 141*  BUN 89* 102* 81* 73* 82*  CALCIUM 7.9*  --  8.1* 7.9* 8.3*  CREATININE 4.55* 4.70* 4.10* 3.71* 3.85*  GFRNONAA 12*  --  14* 15* 15*  GFRAA 14*  --  16* 18* 17*    LIVER FUNCTION TESTS: Recent Labs    02/16/19 0658  03/07/19 0749 03/09/19 0502 03/11/19 0542 03/14/19 0611  BILITOT 1.1  --   --   --   --   --   AST 21  --   --   --   --   --   ALT 15  --   --   --   --   --   ALKPHOS 112  --   --   --   --   --   PROT 6.0*  --   --   --   --   --   ALBUMIN 1.9*   < > 1.5* 1.7* 1.6* 1.5*   < > = values in this interval not displayed.  TUMOR MARKERS: No results for input(s): AFPTM, CEA, CA199, CHROMGRNA in the last 8760 hours.  Assessment and Plan:  ESRD Temp cath placed 10/17 in IR Now for conversion/placement for tunneled catheter per Dr Holley Raring Risks and benefits discussed with the patient's daughter Melodie via phone including, but not limited to bleeding, infection, vascular injury, pneumothorax which may require chest tube placement, air embolism or even death  All questions were answered, She is agreeable to proceed. Consent signed and in chart.   Thank you for this interesting consult.  I greatly enjoyed meeting Jovonne Odorizzi and  look forward to participating in their care.  A copy of this report was sent to the requesting provider on this date.  Electronically Signed: Lavonia Drafts, PA-C 03/14/2019, 9:49 AM   I spent a total of 20 Minutes    in face to face in clinical consultation, greater than 50% of which was counseling/coordinating care for tunneled HD catheter

## 2019-03-14 NOTE — Progress Notes (Addendum)
Pulmonary Critical Care Medicine [place]   PULMONARY CRITICAL CARE SERVICE  PROGRESS NOTE  Date of Service: 03/14/2019  Paul Ball  [id number]  DOB: [birth year]   DOA: 02/15/2019  Referring Physician: [name], MD  HPI: Paul Ball is a 73 y.o. male seen for follow up of Acute on Chronic Respiratory Failure.  Patient has a 12-hour goal today on pressure support however was unable to reach due to increased respirations.  On full support.  Medications: Reviewed on Rounds  Physical Exam:  Vitals: Pulse 77 respirations 24 BP 132/51 O2 sat 90% temp 98.5  Ventilator Settings ventilator mode AC VC rate of 16 tidal volume 420 PEEP of 7 with FiO2 28%  . General: Comfortable at this time . Eyes: Grossly normal lids, irises & conjunctiva . ENT: grossly tongue is normal . Neck: no obvious mass . Cardiovascular: S1 S2 normal no gallop . Respiratory: No rales or rhonchi noted . Abdomen: soft . Skin: no rash seen on limited exam . Musculoskeletal: not rigid . Psychiatric:unable to assess . Neurologic: no seizure no involuntary movements         Lab Data:   Basic Metabolic Panel: Recent Labs  Lab 03/09/19 0502 03/11/19 0542 03/14/19 0611  NA 129* 127* 127*  K 3.8 3.8 3.8  CL 92* 93* 92*  CO2 25 23 23   GLUCOSE 183* 165* 141*  BUN 81* 73* 82*  CREATININE 4.10* 3.71* 3.85*  CALCIUM 8.1* 7.9* 8.3*  PHOS 3.6 2.8 2.8    ABG: Recent Labs  Lab 03/08/19 0950  PHART 7.421  PCO2ART 40.5  PO2ART 180*  HCO3 25.9  O2SAT 99.6    Liver Function Tests: Recent Labs  Lab 03/09/19 0502 03/11/19 0542 03/14/19 0611  ALBUMIN 1.7* 1.6* 1.5*   No results for input(s): LIPASE, AMYLASE in the last 168 hours. No results for input(s): AMMONIA in the last 168 hours.  CBC: Recent Labs  Lab 03/08/19 0536 03/09/19 0502 03/11/19 0542 03/14/19 0611  WBC 8.4 7.8 7.4 7.9  HGB 11.2* 11.4* 10.4* 10.2*  HCT 33.4* 34.3* 31.4* 30.8*  MCV 85.9 86.6 87.2  86.5  PLT 172 169 194 197    Cardiac Enzymes: No results for input(s): CKTOTAL, CKMB, CKMBINDEX, TROPONINI in the last 168 hours.  BNP (last 3 results) No results for input(s): BNP in the last 8760 hours.  ProBNP (last 3 results) No results for input(s): PROBNP in the last 8760 hours.  Radiological Exams: Dg Chest 1 View  Result Date: 03/13/2019 CLINICAL DATA:  Follow-up pneumothorax, chest tube in place EXAM: CHEST  1 VIEW COMPARISON:  03/12/2019 FINDINGS: Tracheostomy in satisfactory position. Right IJ venous catheter terminates in the upper right atrium. Interstitial/airspace opacities bilaterally, suggesting moderate interstitial edema. Moderate left and small right pleural effusions. Indwelling right pigtail chest tube. Prior right pneumothorax has resolved. Cardiomegaly. IMPRESSION: Indwelling right pigtail chest tube. Prior right pneumothorax is resolved. Cardiomegaly with moderate interstitial edema. Moderate left and small right pleural effusions. Additional support apparatus as above. Electronically Signed   By: Julian Hy M.D.   On: 03/13/2019 13:28   Dg Chest Port 1 View  Result Date: 03/14/2019 CLINICAL DATA:  Pneumothorax. Chest tube in situ. EXAM: PORTABLE CHEST 1 VIEW COMPARISON:  One-view chest x-ray 03/13/2019 FINDINGS: A tiny apical pneumothorax is evident on today's study. Tracheostomy tube is stable. Right IJ line is stable. The heart is mildly enlarged. Atherosclerotic changes are noted at the aorta. A shifting interstitial pattern is consistent with edema or  ARDS. Marland Kitchen IMPRESSION: 1. Tiny right apical pneumothorax has recurred. 2. Increasing interstitial pattern compatible with edema or ARDS. 3. Stable tracheostomy tube and right IJ line. Electronically Signed   By: San Morelle M.D.   On: 03/14/2019 07:38    Assessment/Plan Active Problems:   Acute on chronic respiratory failure with hypoxia (HCC)   Acute on chronic systolic and diastolic heart failure,  NYHA class 3 (HCC)   Acute renal failure superimposed on stage 3 chronic kidney disease (HCC)   Healthcare-associated pneumonia   COPD, severe (HCC)   Duodenal ulcer   Anemia due to GI blood loss   1. Acute on chronic respiratory failure with hypoxiapatient failed pressure support wean today back will support ventilator.  Continue aggressive pulmonary toilet supportive measures and continue to wean per protocol patient can tolerate. 2. Acute on chronic systolic heart failure needs more aggressive fluid management 3. Acute on chronic renal failure per nephrology 4. Healthcare associated pneumonia treated 5. Severe COPD patient is at baseline we will continue present management   I have personally seen and evaluated the patient, evaluated laboratory and imaging results, formulated the assessment and plan and placed orders. The Patient requires high complexity decision making for assessment and support.  Case was discussed on Rounds with the Respiratory Therapy Staff  Allyne Gee, MD [facility] Pulmonary Critical Care Medicine Sleep Medicine

## 2019-03-14 NOTE — Progress Notes (Signed)
[facility]  ROUNDING NOTE   Subjective:  Patient remains critically ill at the moment. Had thoracentesis performed and subsequently developed pneumothorax. Patient has right-sided chest tube in place now.  Objective:  Vital signs in last 24 hours:  Pulse 77 respirations 20 blood pressure 112/47  Physical Exam: General: Critically ill-appearing  Head: Normocephalic, atraumatic. Moist oral mucosal membranes  Eyes: Anicteric  Neck: Tracheostomy in place  Lungs:  Scattered rhonchi, vent assisted  Heart: S1S2 no rubs  Abdomen:  Soft, nontender, bowel sounds present, PEG in place  Extremities: 3+ peripheral edema.  Neurologic: Not following commands  Skin: No rash  Access: Right internal jugular temporary dialysis catheter    Basic Metabolic Panel: Recent Labs  Lab 03/07/19 1423 03/09/19 0502 03/11/19 0542 03/14/19 0611  NA 129* 129* 127* 127*  K 3.8 3.8 3.8 3.8  CL 91* 92* 93* 92*  CO2  --  25 23 23   GLUCOSE 127* 183* 165* 141*  BUN 102* 81* 73* 82*  CREATININE 4.70* 4.10* 3.71* 3.85*  CALCIUM  --  8.1* 7.9* 8.3*  PHOS  --  3.6 2.8 2.8    Liver Function Tests: Recent Labs  Lab 03/09/19 0502 03/11/19 0542 03/14/19 0611  ALBUMIN 1.7* 1.6* 1.5*   No results for input(s): LIPASE, AMYLASE in the last 168 hours. No results for input(s): AMMONIA in the last 168 hours.  CBC: Recent Labs  Lab 03/07/19 1423 03/08/19 0536 03/09/19 0502 03/11/19 0542 03/14/19 0611  WBC  --  8.4 7.8 7.4 7.9  HGB 7.8* 11.2* 11.4* 10.4* 10.2*  HCT 23.0* 33.4* 34.3* 31.4* 30.8*  MCV  --  85.9 86.6 87.2 86.5  PLT  --  172 169 194 197    Cardiac Enzymes: No results for input(s): CKTOTAL, CKMB, CKMBINDEX, TROPONINI in the last 168 hours.  BNP: Invalid input(s): POCBNP  CBG: No results for input(s): GLUCAP in the last 168 hours.  Microbiology: Results for orders placed or performed during the hospital encounter of 02/15/19  Culture, Urine     Status: None    Collection Time: 02/15/19 12:52 AM   Specimen: Urine, Random  Result Value Ref Range Status   Specimen Description URINE, RANDOM  Final   Special Requests NONE  Final   Culture   Final    NO GROWTH Performed at [facility], [place] [address], [place], [place] [address]    Report Status 02/17/2019 FINAL  Final  Culture, respiratory (non-expectorated)     Status: None   Collection Time: 02/15/19 10:50 PM   Specimen: Tracheal Aspirate; Respiratory  Result Value Ref Range Status   Specimen Description TRACHEAL ASPIRATE  Final   Special Requests NONE  Final   Gram Stain   Final    NO WBC SEEN RARE GRAM POSITIVE COCCI IN PAIRS Performed at [facility], [address], [place],  [address]    Culture MODERATE ENTEROCOCCUS FAECALIS  Final   Report Status 02/19/2019 FINAL  Final   Organism ID, Bacteria ENTEROCOCCUS FAECALIS  Final      Susceptibility   Enterococcus faecalis - MIC*    AMPICILLIN <=2 SENSITIVE Sensitive     VANCOMYCIN 1 SENSITIVE Sensitive     GENTAMICIN SYNERGY RESISTANT Resistant     * MODERATE ENTEROCOCCUS FAECALIS  SARS Coronavirus 2 by RT PCR (hospital order, performed in [place] hospital lab) Nasopharyngeal Nasopharyngeal Swab     Status: None   Collection Time: 02/16/19  2:20 PM   Specimen:  Nasopharyngeal Swab  Result Value Ref Range Status   SARS Coronavirus 2 NEGATIVE NEGATIVE Final    Comment: (NOTE) If result is NEGATIVE SARS-CoV-2 target nucleic acids are NOT DETECTED. The SARS-CoV-2 RNA is generally detectable in upper and lower  respiratory specimens during the acute phase of infection. The lowest  concentration of SARS-CoV-2 viral copies this assay can detect is 250  copies / mL. A negative result does not preclude SARS-CoV-2 infection  and should not be used as the sole basis for treatment or other  patient management decisions.  A negative result may occur with  improper specimen collection / handling, submission of specimen  other  than nasopharyngeal swab, presence of viral mutation(s) within the  areas targeted by this assay, and inadequate number of viral copies  (<250 copies / mL). A negative result must be combined with clinical  observations, patient history, and epidemiological information. If result is POSITIVE SARS-CoV-2 target nucleic acids are DETECTED. The SARS-CoV-2 RNA is generally detectable in upper and lower  respiratory specimens dur ing the acute phase of infection.  Positive  results are indicative of active infection with SARS-CoV-2.  Clinical  correlation with patient history and other diagnostic information is  necessary to determine patient infection status.  Positive results do  not rule out bacterial infection or co-infection with other viruses. If result is PRESUMPTIVE POSTIVE SARS-CoV-2 nucleic acids MAY BE PRESENT.   A presumptive positive result was obtained on the submitted specimen  and confirmed on repeat testing.  While 2019 novel coronavirus  (SARS-CoV-2) nucleic acids may be present in the submitted sample  additional confirmatory testing may be necessary for epidemiological  and / or clinical management purposes  to differentiate between  SARS-CoV-2 and other Sarbecovirus currently known to infect humans.  If clinically indicated additional testing with an alternate test  methodology [phone]) is advised. The SARS-CoV-2 RNA is generally  detectable in upper and lower respiratory sp ecimens during the acute  phase of infection. The expected result is Negative. Fact Sheet for Patients:  StrictlyIdeas.no Fact Sheet for Healthcare Providers: BankingDealers.co.za This test is not yet approved or cleared by the [place] FDA and has been authorized for detection and/or diagnosis of SARS-CoV-2 by FDA under an Emergency Use Authorization (EUA).  This EUA will remain in effect (meaning this test can be used) for the duration  of the COVID-19 declaration under Section 564(b)(1) of the Act, 21 U.S.C. section 360bbb-3(b)(1), unless the authorization is terminated or revoked sooner. Performed at [facility], [place] [address] [place], [place] [address]   SARS CORONAVIRUS 2 (TAT 6-24 HRS) Nasopharyngeal Nasopharyngeal Swab     Status: None   Collection Time: 03/07/19  7:40 AM   Specimen: Nasopharyngeal Swab  Result Value Ref Range Status   SARS Coronavirus 2 NEGATIVE NEGATIVE Final    Comment: (NOTE) SARS-CoV-2 target nucleic acids are NOT DETECTED. The SARS-CoV-2 RNA is generally detectable in upper and lower respiratory specimens during the acute phase of infection. Negative results do not preclude SARS-CoV-2 infection, do not rule out co-infections with other pathogens, and should not be used as the sole basis for treatment or other patient management decisions. Negative results must be combined with clinical observations, patient history, and epidemiological information. The expected result is Negative. Fact Sheet for Patients: SugarRoll.be Fact Sheet for Healthcare Providers: https://www.woods-mathews.com/ This test is not yet approved or cleared by the [place] FDA and  has been authorized for detection and/or diagnosis of SARS-CoV-2 by  FDA under an Emergency Use Authorization (EUA). This EUA will remain  in effect (meaning this test can be used) for the duration of the COVID-19 declaration under Section 56 4(b)(1) of the Act, 21 U.S.C. section 360bbb-3(b)(1), unless the authorization is terminated or revoked sooner. Performed at [facility], [place] [address], [place], [place] [address]   Stat Gram stain     Status: None   Collection Time: 03/11/19 12:46 PM   Specimen: Pleura; Body Fluid  Result Value Ref Range Status   Specimen Description PLEURAL LEFT  Final   Special Requests Normal  Final   Gram Stain   Final    NO WBC SEEN NO  ORGANISMS SEEN Performed at [facility], [address] [place], [place] [address]    Report Status 03/11/2019 FINAL  Final  Gram stain     Status: None   Collection Time: 03/12/19 10:37 AM   Specimen: PATH Cytology Pleural fluid  Result Value Ref Range Status   Specimen Description PLEURAL RIGHT  Final   Special Requests NONE  Final   Gram Stain   Final    RARE WBC PRESENT, PREDOMINANTLY MONONUCLEAR NO ORGANISMS SEEN Performed at [facility], [place] [address], [place], [place] [address]    Report Status 03/12/2019 FINAL  Final  Culture, body fluid-bottle     Status: None (Preliminary result)   Collection Time: 03/12/19 10:37 AM   Specimen: Pleura  Result Value Ref Range Status   Specimen Description PLEURAL RIGHT  Final   Special Requests NONE  Final   Culture   Final    NO GROWTH 2 DAYS Performed at [place] [address], [place], [place] [address]    Report Status PENDING  Incomplete    Coagulation Studies: Recent Labs    03/14/19 0719  LABPROT 16.7*  INR 1.4*    Urinalysis: No results for input(s): COLORURINE, LABSPEC, PHURINE, GLUCOSEU, HGBUR, BILIRUBINUR, KETONESUR, PROTEINUR, UROBILINOGEN, NITRITE, LEUKOCYTESUR in the last 72 hours.  Invalid input(s): APPERANCEUR    Imaging: Dg Chest 1 View  Result Date: 03/13/2019 CLINICAL DATA:  Follow-up pneumothorax, chest tube in place EXAM: CHEST  1 VIEW COMPARISON:  03/12/2019 FINDINGS: Tracheostomy in satisfactory position. Right IJ venous catheter terminates in the upper right atrium. Interstitial/airspace opacities bilaterally, suggesting moderate interstitial edema. Moderate left and small right pleural effusions. Indwelling right pigtail chest tube. Prior right pneumothorax has resolved. Cardiomegaly. IMPRESSION: Indwelling right pigtail chest tube. Prior right pneumothorax is resolved. Cardiomegaly with moderate interstitial edema. Moderate left and small right pleural effusions. Additional  support apparatus as above. Electronically Signed   By: Julian Hy M.D.   On: 03/13/2019 13:28   Dg Chest 1 View  Result Date: 03/12/2019 CLINICAL DATA:  S/P Right thora. 1.5 liters removed EXAM: CHEST  1 VIEW COMPARISON:  the previous day's study FINDINGS: Evacuation of right pneumothorax. Small-moderate right pneumothorax, right lung apex projecting over the posterior aspect right third rib. Mild interstitial and alveolar opacities bilaterally, left greater than right, marginally improved since previous. Heart size normal. No mediastinal shift. Right IJ dialysis catheter and endotracheal tube stable in position. There may be a small residual left pleural effusion. Visualized bones unremarkable. IMPRESSION: 1. Right pneumothorax post right thoracentesis. Electronically Signed   By: Lucrezia Europe M.D.   On: 03/12/2019 11:27   Dg Chest Port 1 View  Result Date: 03/14/2019 CLINICAL DATA:  Pneumothorax. Chest tube in situ. EXAM: PORTABLE CHEST 1 VIEW COMPARISON:  One-view chest x-ray 03/13/2019 FINDINGS: A tiny apical pneumothorax is evident on today's study. Tracheostomy tube is stable. Right IJ line is stable. The heart is mildly enlarged. Atherosclerotic changes are noted at the aorta. A shifting interstitial pattern is consistent with edema or ARDS. Marland Kitchen IMPRESSION: 1. Tiny right apical pneumothorax has recurred. 2. Increasing interstitial pattern compatible with edema or ARDS. 3. Stable tracheostomy tube and right IJ line. Electronically Signed   By: San Morelle M.D.   On: 03/14/2019 07:38   Ir Perc Pleural Drain W/indwell Cath W/img Guide  Result Date: 03/12/2019 INDICATION: Moderate right pneumothorax after thoracentesis, intubated on positive pressure ventilation EXAM: RIGHT CHEST TUBE PLACEMENT UNDER FLUOROSCOPY MEDICATIONS: The patient is currently admitted to the hospital and receiving intravenous antibiotics. The antibiotics were administered within an appropriate time frame prior to  the initiation of the procedure. ANESTHESIA/SEDATION: Intravenous Fentanyl 11mg and Versed 155mwere administered as conscious sedation during continuous monitoring of the patient's level of consciousness and physiological / cardiorespiratory status by the radiology RN, with a total moderate sedation time of 10 minutes. PROCEDURE: Informed written consent was obtained from the family after a thorough discussion of the procedural risks, benefits and alternatives. All questions were addressed. Maximal Sterile Barrier Technique was utilized including caps, mask, sterile gowns, sterile gloves, sterile drape, hand hygiene and skin antiseptic. A timeout was performed prior to the initiation of the procedure. The right lateral thorax was prepped with chlorhexidine, draped in usual sterile fashion, infiltrated with 1% lidocaine. An appropriate skin entry site was determined fluoroscopically 18 gauge percutaneous entry needle advanced into the pleural space. Air could be aspirated in an Amplatz wire advanced easily, position confirmed on fluoroscopy. Tract dilated to facilitate placement of a 14 French pigtail drain catheter, placed laterally directed towards the apex. Catheter was secured externally with 0 Prolene suture and StatLock and placed to Pleur-evac -20 cm H2O suction. The patient tolerated the procedure well. FLUOROSCOPY TIME:  0.2 minutes; 17  uGym2 DAP COMPLICATIONS: None immediate. IMPRESSION: 1. Technically successful right chest tube placement under fluoroscopy. Electronically Signed   By: D Lucrezia Europe.D.   On: 03/12/2019 16:15   UsKoreahoracentesis Asp Pleural Space W/img Guide  Result Date: 03/12/2019 INDICATION: Respiratory failure requiring tracheostomy and mechanical ventilation. Large right pleural effusion. Request for diagnostic and therapeutic thoracentesis. EXAM: ULTRASOUND GUIDED RIGHT THORACENTESIS MEDICATIONS: 1% lidocaine 10 mL COMPLICATIONS: SIR LEVEL B - Normal therapy, includes overnight  admission for observation. PROCEDURE: An ultrasound guided thoracentesis was thoroughly discussed with the patient and questions answered. The benefits, risks, alternatives and complications were also discussed. The patient understands and wishes to proceed with the procedure. Written consent was obtained. Ultrasound was performed to localize and mark an adequate pocket of fluid in the right chest. The area was then prepped and draped in the normal sterile fashion. 1% Lidocaine was used for local anesthesia. Under ultrasound guidance a catheter was introduced. Thoracentesis was performed. The catheter was removed and a dressing applied. FINDINGS: A total of approximately 1.7 L of amber fluid was removed. Samples were sent to the laboratory as requested by the clinical team. IMPRESSION: Successful ultrasound guided right thoracentesis yielding 1.7 L of pleural fluid. Moderate pneumothorax on post-procedure chest x-ray. Given patient is on mechanical ventilation will proceed with placement of pigtail chest tube. Read by: WeGareth EaglePA-C Electronically Signed   By: D Lucrezia Europe.D.   On: 03/12/2019 11:55     Medications:     iohexol  Assessment/ Plan:  73 y.o. male with a PMHx of BPH, allergic rhinitis, COPD, diabetes mellitus type 2, hypertension, GERD, acute respiratory failure, congestive heart failure, chronic kidney disease stage IIIb EGFR 43 who was admitted to [facility] on 02/15/2019 for ongoing management of acute respiratory failure.   1.  Acute kidney injury/chronic kidney disease stage IIIb baseline GFR 43.    Patient seen and evaluated during dialysis treatment.  Ultrafiltration target 2.5 to 3 kg as tolerated today.  Has significant volume on board..   2.  Acute respiratory failure.  FiO2 has been weaned down to 28%.  Still requiring ventilatory support.  Developed right-sided pneumothorax status post thoracentesis.  Chest tube in place.  3.  Anemia of chronic kidney disease.  Hemoglobin  10.2.  Hold off on Retacrit at this time.  4.  Secondary hyperparathyroidism.  Repeat serum phosphorus today.   LOS: 0 Ewa Hipp 11/9/20208:24 AM

## 2019-03-14 NOTE — Anesthesia Preprocedure Evaluation (Deleted)
Anesthesia Evaluation  Patient identified by MRN, date of birth, ID band  Reviewed: Allergy & Precautions, NPO status , Patient's Chart, lab work & pertinent test results  Airway Mallampati: Intubated       Dental   Pulmonary COPD,  Acute on chronic respiratory failure with hypoxia      + intubated    Cardiovascular +CHF    ECG: Sinus rhythm with frequent Premature ventricular complexes  ECHO: 1. Left ventricular ejection fraction, by visual estimation, is 50 to 55%. The left ventricle has normal function. Normal left ventricular size. Mildly increased left ventricular posterior wall thickness. There is mildly increased left ventricular hypertrophy. 2. Left ventricular diastolic Doppler parameters are consistent with pseudonormalization pattern of LV diastolic filling. 3. Global right ventricle has normal systolic function.The right ventricular size is normal. Mildly increased right ventricular wall thickness. 4. Left atrial size was moderately dilated. 5. Right atrial size was mildly dilated. 6. Large pleural effusion in both left and right lateral regions. 7. The pericardial effusion is posterior to the left ventricle. 8. Trivial pericardial effusion is present. 9. Moderate aortic valve annular calcification. 10. The mitral valve is degenerative. Mild mitral valve regurgitation. No evidence of mitral stenosis. 11. The tricuspid valve is normal in structure. Tricuspid valve regurgitation is mild. 12. The aortic valve is tricuspid Aortic valve regurgitation is mild by color flow Doppler. Mild to moderate aortic valve stenosis. 13. There is Mild thickening of the aortic valve. 14. There is Severe calcifcation of the aortic valve. 15. The pulmonic valve was normal in structure. Pulmonic valve regurgitation is trivial by color flow Doppler. 16. The inferior vena cava is normal in size with greater than 50% respiratory  variability, suggesting right atrial pressure of 3 mmHg. 33. Small patent foramen ovale with predominantly right to left shunting across the atrial septum. 18. Evidence of atrial level shunting detected by color flow Doppler.   Neuro/Psych negative neurological ROS     GI/Hepatic negative GI ROS, Neg liver ROS, PUD,   Endo/Other  hyponatremia  Renal/GU ESRF and DialysisRenal disease     Musculoskeletal negative musculoskeletal ROS (+)   Abdominal   Peds  Hematology  (+) anemia ,   Anesthesia Other Findings ESRD  Reproductive/Obstetrics                            Anesthesia Physical Anesthesia Plan  ASA: IV  Anesthesia Plan: General   Post-op Pain Management:    Induction: Intravenous  PONV Risk Score and Plan: 2 and Ondansetron, Dexamethasone and Treatment may vary due to age or medical condition  Airway Management Planned:   Additional Equipment:   Intra-op Plan:   Post-operative Plan: Post-operative intubation/ventilation  Informed Consent:   Plan Discussed with: Anesthesiologist  Anesthesia Plan Comments:        Anesthesia Quick Evaluation

## 2019-03-14 NOTE — Progress Notes (Signed)
Patient ID: Paul Ball, male   DOB: 1945/07/10, 73 y.o.   MRN: [id number] The patient was on the OR schedule today for tunneled hemodialysis catheter placement.  Apparently interventional radiology has been consulted for this as well.  When I sent for the patient, he had been get off of hemodialysis today and his tube feeds had been started 1 hour ago.  I discussed with with the patient's nurse and also nurse practitioner caring for Paul Ball.  He obviously cannot have a catheter placed today.  Vascular surgery will sign off.  Interventional radiology is involved with the patient due to his pleural effusion draining and subsequent pneumothorax treatment.  Please call if we can assist

## 2019-03-15 ENCOUNTER — Institutional Professional Consult (permissible substitution) (HOSPITAL_COMMUNITY): Payer: Medicare Other

## 2019-03-15 ENCOUNTER — Encounter (HOSPITAL_COMMUNITY): Payer: Self-pay | Admitting: Interventional Radiology

## 2019-03-15 DIAGNOSIS — N179 Acute kidney failure, unspecified: Secondary | ICD-10-CM | POA: Diagnosis not present

## 2019-03-15 DIAGNOSIS — J449 Chronic obstructive pulmonary disease, unspecified: Secondary | ICD-10-CM | POA: Diagnosis not present

## 2019-03-15 DIAGNOSIS — J9621 Acute and chronic respiratory failure with hypoxia: Secondary | ICD-10-CM | POA: Diagnosis not present

## 2019-03-15 DIAGNOSIS — I5043 Acute on chronic combined systolic (congestive) and diastolic (congestive) heart failure: Secondary | ICD-10-CM | POA: Diagnosis not present

## 2019-03-15 HISTORY — PX: IR FLUORO GUIDE CV LINE RIGHT: IMG2283

## 2019-03-15 HISTORY — PX: IR US GUIDE VASC ACCESS RIGHT: IMG2390

## 2019-03-15 LAB — CYTOLOGY - NON PAP

## 2019-03-15 MED ORDER — HEPARIN SODIUM (PORCINE) 1000 UNIT/ML IJ SOLN
INTRAMUSCULAR | Status: AC
  Filled 2019-03-15: qty 1

## 2019-03-15 MED ORDER — LIDOCAINE HCL 1 % IJ SOLN
INTRAMUSCULAR | Status: AC
  Filled 2019-03-15: qty 20

## 2019-03-15 MED ORDER — CEFAZOLIN SODIUM-DEXTROSE 2-4 GM/100ML-% IV SOLN
INTRAVENOUS | Status: AC
  Administered 2019-03-15: 11:00:00 2 g
  Filled 2019-03-15: qty 100

## 2019-03-15 MED ORDER — GENERIC EXTERNAL MEDICATION
Status: DC
Start: ? — End: 2019-03-15

## 2019-03-15 MED ORDER — FENTANYL CITRATE (PF) 100 MCG/2ML IJ SOLN
INTRAMUSCULAR | Status: AC
  Filled 2019-03-15: qty 2

## 2019-03-15 MED ORDER — MIDAZOLAM HCL 2 MG/2ML IJ SOLN
INTRAMUSCULAR | Status: AC | PRN
  Administered 2019-03-15: 0.5 mg via INTRAVENOUS

## 2019-03-15 MED ORDER — MIDAZOLAM HCL 2 MG/2ML IJ SOLN
INTRAMUSCULAR | Status: AC
  Filled 2019-03-15: qty 2

## 2019-03-15 MED ORDER — FENTANYL CITRATE (PF) 100 MCG/2ML IJ SOLN
INTRAMUSCULAR | Status: AC | PRN
  Administered 2019-03-15: 25 ug via INTRAVENOUS

## 2019-03-15 MED ORDER — CHLORHEXIDINE GLUCONATE 4 % EX LIQD
CUTANEOUS | Status: AC
  Filled 2019-03-15: qty 15

## 2019-03-15 NOTE — Procedures (Signed)
  Procedure: R IJ tunneled HD catheter placmenet Palindrome 23 EBL:   minimal Complications:  none immediate  See full dictation in [facility].  Dillard Cannon MD Main # [phone] Pager  [phone]

## 2019-03-15 NOTE — Progress Notes (Signed)
Pulmonary Critical Care Medicine [facility]   PULMONARY CRITICAL CARE SERVICE  PROGRESS NOTE  Date of Service: 03/15/2019  Paul Ball  [name]  DOB: 10-19-1945   DOA: 02/15/2019  Referring Physician: [name], MD  HPI: Paul Ball is a 73 y.o. male seen for follow up of Acute on Chronic Respiratory Failure.  Patient chest x-ray actually looked little bit better.  There is less fluid continues to have drainage in the chest tube.  He remains on pressure support mode  Medications: Reviewed on Rounds  Physical Exam:  Vitals: Temperature 98.5 pulse 76 respiratory rate 20 blood pressure 164/50 saturations 95%  Ventilator Settings mode ventilation pressure support FiO2 20% tidal volume 414 pressure support 12 PEEP 7  . General: Comfortable at this time . Eyes: Grossly normal lids, irises & conjunctiva . ENT: grossly tongue is normal . Neck: no obvious mass . Cardiovascular: S1 S2 normal no gallop . Respiratory: No rhonchi no rales are noted at this time . Abdomen: soft . Skin: no rash seen on limited exam . Musculoskeletal: not rigid . Psychiatric:unable to assess . Neurologic: no seizure no involuntary movements         Lab Data:   Basic Metabolic Panel: Recent Labs  Lab 03/09/19 0502 03/11/19 0542 03/14/19 0611  NA 129* 127* 127*  K 3.8 3.8 3.8  CL 92* 93* 92*  CO2 25 23 23   GLUCOSE 183* 165* 141*  BUN 81* 73* 82*  CREATININE 4.10* 3.71* 3.85*  CALCIUM 8.1* 7.9* 8.3*  PHOS 3.6 2.8 2.8    ABG: No results for input(s): PHART, PCO2ART, PO2ART, HCO3, O2SAT in the last 168 hours.  Liver Function Tests: Recent Labs  Lab 03/09/19 0502 03/11/19 0542 03/14/19 0611  ALBUMIN 1.7* 1.6* 1.5*   No results for input(s): LIPASE, AMYLASE in the last 168 hours. No results for input(s): AMMONIA in the last 168 hours.  CBC: Recent Labs  Lab 03/09/19 0502 03/11/19 0542 03/14/19 0611  WBC 7.8 7.4 7.9  HGB 11.4* 10.4* 10.2*  HCT 34.3*  31.4* 30.8*  MCV 86.6 87.2 86.5  PLT 169 194 197    Cardiac Enzymes: No results for input(s): CKTOTAL, CKMB, CKMBINDEX, TROPONINI in the last 168 hours.  BNP (last 3 results) No results for input(s): BNP in the last 8760 hours.  ProBNP (last 3 results) No results for input(s): PROBNP in the last 8760 hours.  Radiological Exams: Dg Chest 1 View  Result Date: 03/13/2019 CLINICAL DATA:  Follow-up pneumothorax, chest tube in place EXAM: CHEST  1 VIEW COMPARISON:  03/12/2019 FINDINGS: Tracheostomy in satisfactory position. Right IJ venous catheter terminates in the upper right atrium. Interstitial/airspace opacities bilaterally, suggesting moderate interstitial edema. Moderate left and small right pleural effusions. Indwelling right pigtail chest tube. Prior right pneumothorax has resolved. Cardiomegaly. IMPRESSION: Indwelling right pigtail chest tube. Prior right pneumothorax is resolved. Cardiomegaly with moderate interstitial edema. Moderate left and small right pleural effusions. Additional support apparatus as above. Electronically Signed   By: Julian Hy M.D.   On: 03/13/2019 13:28   Dg Chest Port 1 View  Result Date: 03/14/2019 CLINICAL DATA:  Pneumothorax. Chest tube in situ. EXAM: PORTABLE CHEST 1 VIEW COMPARISON:  One-view chest x-ray 03/13/2019 FINDINGS: A tiny apical pneumothorax is evident on today's study. Tracheostomy tube is stable. Right IJ line is stable. The heart is mildly enlarged. Atherosclerotic changes are noted at the aorta. A shifting interstitial pattern is consistent with edema or ARDS. Marland Kitchen IMPRESSION: 1. Tiny right apical  pneumothorax has recurred. 2. Increasing interstitial pattern compatible with edema or ARDS. 3. Stable tracheostomy tube and right IJ line. Electronically Signed   By: San Morelle M.D.   On: 03/14/2019 07:38    Assessment/Plan Active Problems:   Acute on chronic respiratory failure with hypoxia (HCC)   Acute on chronic systolic and  diastolic heart failure, NYHA class 3 (HCC)   Acute renal failure superimposed on stage 3 chronic kidney disease (HCC)   Healthcare-associated pneumonia   COPD, severe (HCC)   Duodenal ulcer   Anemia due to GI blood loss   1. Acute on chronic respiratory failure with hypoxia we will continue with the wean protocol as tolerated titrate oxygen continue pulmonary toilet. 2. Healthcare associated pneumonia treated we will continue with supportive care 3. Severe COPD patient is at baseline 4. Acute renal failure being followed by nephrology   I have personally seen and evaluated the patient, evaluated laboratory and imaging results, formulated the assessment and plan and placed orders. The Patient requires high complexity decision making for assessment and support.  Case was discussed on Rounds with the Respiratory Therapy Staff  Allyne Gee, MD [facility] Pulmonary Critical Care Medicine Sleep Medicine

## 2019-03-15 NOTE — Progress Notes (Signed)
Supervising Physician: Arne Cleveland  Patient Status:  Paul Ball  Chief Complaint: Follow up right chest tube placed 03/12/19 by Dr. Vernard Gambles for right ptx following thoracentesis.  Subjective:  Patient with tracheostomy on ventilatory support, sedated. Moves to tactile stimuli but does not open eyes or follow commands. No concerns noted by staff member at bedside.   Allergies: Shellfish-derived products  Medications: Prior to Admission medications   Not on File     Vital Signs: BP 132/74    Pulse 79    Temp 97.8 F (36.6 C) (Temporal)    Resp (!) 26    SpO2 100%   Physical Exam Vitals signs reviewed.  Constitutional:      Appearance: He is ill-appearing.     Comments: Does not respond to verbal cues  HENT:     Head: Normocephalic.  Cardiovascular:     Rate and Rhythm: Normal rate.  Pulmonary:     Comments: (+) tracheostomy, ventilated. (+) right sided chest tube to water seal; ~ 15 cc clear, serous output in pleur evac. No obvious air leak or SQ emphysema. Insertion site clean, dry, dressed appropriately.  Skin:    General: Skin is warm.  Neurological:     Mental Status: Mental status is at baseline.     Imaging: Dg Chest 1 View  Result Date: 03/13/2019 CLINICAL DATA:  Follow-up pneumothorax, chest tube in place EXAM: CHEST  1 VIEW COMPARISON:  03/12/2019 FINDINGS: Tracheostomy in satisfactory position. Right IJ venous catheter terminates in the upper right atrium. Interstitial/airspace opacities bilaterally, suggesting moderate interstitial edema. Moderate left and small right pleural effusions. Indwelling right pigtail chest tube. Prior right pneumothorax has resolved. Cardiomegaly. IMPRESSION: Indwelling right pigtail chest tube. Prior right pneumothorax is resolved. Cardiomegaly with moderate interstitial edema. Moderate left and small right pleural effusions. Additional support apparatus as above. Electronically Signed   By: Julian Hy M.D.   On:  03/13/2019 13:28   Dg Chest 1 View  Result Date: 03/12/2019 CLINICAL DATA:  S/P Right thora. 1.5 liters removed EXAM: CHEST  1 VIEW COMPARISON:  the previous day's study FINDINGS: Evacuation of right pneumothorax. Small-moderate right pneumothorax, right lung apex projecting over the posterior aspect right third rib. Mild interstitial and alveolar opacities bilaterally, left greater than right, marginally improved since previous. Heart size normal. No mediastinal shift. Right IJ dialysis catheter and endotracheal tube stable in position. There may be a small residual left pleural effusion. Visualized bones unremarkable. IMPRESSION: 1. Right pneumothorax post right thoracentesis. Electronically Signed   By: Lucrezia Europe M.D.   On: 03/12/2019 11:27   Dg Chest 1 View  Result Date: 03/11/2019 CLINICAL DATA:  S/p left thoracentesis EXAM: CHEST  1 VIEW COMPARISON:  Chest x-ray 03/11/2019 6:35 a.m. FINDINGS: Tracheostomy tube remains in place. Right sided central venous catheter in the upper right atrium as before. Following thoracentesis there is less density at the left lung base with improved aeration, presumed resolution of pleural fluid. Increased interstitial markings remain evident bilaterally. Heart size is enlarged as before. No acute bone findings. IMPRESSION: No pneumothorax status post left thoracentesis. Persistent increased interstitial markings, potentially pulmonary edema and right pleural effusion. Electronically Signed   By: Zetta Bills M.D.   On: 03/11/2019 11:22   Ir US Guide Bx Asp/drain  Result Date: 03/15/2019 INDICATION: Moderate right pneumothorax after thoracentesis, intubated on positive pressure ventilation EXAM: RIGHT CHEST TUBE PLACEMENT UNDER FLUOROSCOPY MEDICATIONS: The patient is currently admitted to the hospital and receiving intravenous antibiotics.  The antibiotics were administered within an appropriate time frame prior to the initiation of the procedure.  ANESTHESIA/SEDATION: Intravenous Fentanyl 56mcg and Versed 1mg  were administered as conscious sedation during continuous monitoring of the patient's level of consciousness and physiological / cardiorespiratory status by the radiology RN, with a total moderate sedation time of 10 minutes. PROCEDURE: Informed written consent was obtained from the family after a thorough discussion of the procedural risks, benefits and alternatives. All questions were addressed. Maximal Sterile Barrier Technique was utilized including caps, mask, sterile gowns, sterile gloves, sterile drape, hand hygiene and skin antiseptic. A timeout was performed prior to the initiation of the procedure. The right lateral thorax was prepped with chlorhexidine, draped in usual sterile fashion, infiltrated with 1% lidocaine. An appropriate skin entry site was determined fluoroscopically 18 gauge percutaneous entry needle advanced into the pleural space. Air could be aspirated in an Amplatz wire advanced easily, position confirmed on fluoroscopy. Tract dilated to facilitate placement of a 14 French pigtail drain catheter, placed laterally directed towards the apex. Catheter was secured externally with 0 Prolene suture and StatLock and placed to Pleur-evac -20 cm H2O suction. The patient tolerated the procedure well. FLUOROSCOPY TIME:  0.2 minutes; 17  uGym2 DAP COMPLICATIONS: None immediate. IMPRESSION: 1. Technically successful right chest tube placement under fluoroscopy. Electronically Signed   By: Lucrezia Europe M.D.   On: 03/12/2019 16:15   Dg Chest Port 1 View  Result Date: 03/14/2019 CLINICAL DATA:  Pneumothorax. Chest tube in situ. EXAM: PORTABLE CHEST 1 VIEW COMPARISON:  One-view chest x-ray 03/13/2019 FINDINGS: A tiny apical pneumothorax is evident on today's study. Tracheostomy tube is stable. Right IJ line is stable. The heart is mildly enlarged. Atherosclerotic changes are noted at the aorta. A shifting interstitial pattern is consistent  with edema or ARDS. Marland Kitchen IMPRESSION: 1. Tiny right apical pneumothorax has recurred. 2. Increasing interstitial pattern compatible with edema or ARDS. 3. Stable tracheostomy tube and right IJ line. Electronically Signed   By: San Morelle M.D.   On: 03/14/2019 07:38   US Thoracentesis Asp Pleural Space W/img Guide  Result Date: 03/12/2019 INDICATION: Respiratory failure requiring tracheostomy and mechanical ventilation. Large right pleural effusion. Request for diagnostic and therapeutic thoracentesis. EXAM: ULTRASOUND GUIDED RIGHT THORACENTESIS MEDICATIONS: 1% lidocaine 10 mL COMPLICATIONS: SIR LEVEL B - Normal therapy, includes overnight admission for observation. PROCEDURE: An ultrasound guided thoracentesis was thoroughly discussed with the patient and questions answered. The benefits, risks, alternatives and complications were also discussed. The patient understands and wishes to proceed with the procedure. Written consent was obtained. Ultrasound was performed to localize and mark an adequate pocket of fluid in the right chest. The area was then prepped and draped in the normal sterile fashion. 1% Lidocaine was used for local anesthesia. Under ultrasound guidance a catheter was introduced. Thoracentesis was performed. The catheter was removed and a dressing applied. FINDINGS: A total of approximately 1.7 L of amber fluid was removed. Samples were sent to the laboratory as requested by the clinical team. IMPRESSION: Successful ultrasound guided right thoracentesis yielding 1.7 L of pleural fluid. Moderate pneumothorax on post-procedure chest x-ray. Given patient is on mechanical ventilation will proceed with placement of pigtail chest tube. Read by: Gareth Eagle, PA-C Electronically Signed   By: Lucrezia Europe M.D.   On: 03/12/2019 11:55   US Thoracentesis Asp Pleural Space W/img Guide  Result Date: 03/11/2019 INDICATION: Acute on chronic respiratory failure. Bilateral pleural effusions. Request for  diagnostic and therapeutic left thoracentesis. EXAM: ULTRASOUND  GUIDED LEFT THORACENTESIS MEDICATIONS: 1% lidocaine 10 mL COMPLICATIONS: None immediate. PROCEDURE: An ultrasound guided thoracentesis was thoroughly discussed with the patient and questions answered. The benefits, risks, alternatives and complications were also discussed. The patient understands and wishes to proceed with the procedure. Written consent was obtained. Ultrasound was performed to localize and mark an adequate pocket of fluid in the left chest. The area was then prepped and draped in the normal sterile fashion. 1% Lidocaine was used for local anesthesia. Under ultrasound guidance a 6 Fr Safe-T-Centesis catheter was introduced. Thoracentesis was performed. The catheter was removed and a dressing applied. FINDINGS: A total of approximately 1.35 L of clear amber fluid was removed. Samples were sent to the laboratory as requested by the clinical team. IMPRESSION: Successful ultrasound guided left thoracentesis yielding 1.35 L of pleural fluid. No pneumothorax on post-procedure chest x-ray. Read by: Gareth Eagle, PA-C Electronically Signed   By: Aletta Edouard M.D.   On: 03/11/2019 12:23    Labs:  CBC: Recent Labs    03/08/19 0536 03/09/19 0502 03/11/19 0542 03/14/19 0611  WBC 8.4 7.8 7.4 7.9  HGB 11.2* 11.4* 10.4* 10.2*  HCT 33.4* 34.3* 31.4* 30.8*  PLT 172 169 194 197    COAGS: Recent Labs    02/16/19 0658 03/14/19 0719  INR 1.3* 1.4*    BMP: Recent Labs    03/07/19 0749 03/07/19 1423 03/09/19 0502 03/11/19 0542 03/14/19 0611  NA 129* 129* 129* 127* 127*  K 4.0 3.8 3.8 3.8 3.8  CL 94* 91* 92* 93* 92*  CO2 23  --  25 23 23   GLUCOSE 166* 127* 183* 165* 141*  BUN 89* 102* 81* 73* 82*  CALCIUM 7.9*  --  8.1* 7.9* 8.3*  CREATININE 4.55* 4.70* 4.10* 3.71* 3.85*  GFRNONAA 12*  --  14* 15* 15*  GFRAA 14*  --  16* 18* 17*    LIVER FUNCTION TESTS: Recent Labs    02/16/19 0658  03/07/19 0749  03/09/19 0502 03/11/19 0542 03/14/19 0611  BILITOT 1.1  --   --   --   --   --   AST 21  --   --   --   --   --   ALT 15  --   --   --   --   --   ALKPHOS 112  --   --   --   --   --   PROT 6.0*  --   --   --   --   --   ALBUMIN 1.9*   < > 1.5* 1.7* 1.6* 1.5*   < > = values in this interval not displayed.    Assessment and Plan:  72 y/o M s/p right chest tube placement 03/12/19 for right ptx following thoracentesis. CXR from 11/8 showed resolution of the ptx and tube was placed to water seal, unfortunately on CXR from 11/9 a tiny apical ptx had recurred. No concerns noted with chest tube by staff, insertion site c/d/i without air leak or SQ emphysema . Approximately 15 cc clear, serous output in pleur evac today.   Tube to remain to water seal for now, will repeat CXR tomorrow AM to assess for resolution of ptx prior to chest tube removal.   Please call IR with questions or concerns.   Electronically Signed: Joaquim Nam, PA-C 03/15/2019, 10:29 AM   I spent a total of 15 Minutes at the the patient's bedside AND on the patient's hospital floor or  unit, greater than 50% of which was counseling/coordinating care for right chest tube.

## 2019-03-16 ENCOUNTER — Other Ambulatory Visit (HOSPITAL_COMMUNITY): Payer: Medicare Other

## 2019-03-16 DIAGNOSIS — N179 Acute kidney failure, unspecified: Secondary | ICD-10-CM | POA: Diagnosis not present

## 2019-03-16 DIAGNOSIS — J9621 Acute and chronic respiratory failure with hypoxia: Secondary | ICD-10-CM | POA: Diagnosis not present

## 2019-03-16 DIAGNOSIS — J449 Chronic obstructive pulmonary disease, unspecified: Secondary | ICD-10-CM | POA: Diagnosis not present

## 2019-03-16 DIAGNOSIS — I5043 Acute on chronic combined systolic (congestive) and diastolic (congestive) heart failure: Secondary | ICD-10-CM | POA: Diagnosis not present

## 2019-03-16 LAB — RENAL FUNCTION PANEL
Albumin: 1.5 g/dL — ABNORMAL LOW (ref 3.5–5.0)
Anion gap: 13 (ref 5–15)
BUN: 72 mg/dL — ABNORMAL HIGH (ref 8–23)
CO2: 23 mmol/L (ref 22–32)
Calcium: 8.5 mg/dL — ABNORMAL LOW (ref 8.9–10.3)
Chloride: 92 mmol/L — ABNORMAL LOW (ref 98–111)
Creatinine, Ser: 3.71 mg/dL — ABNORMAL HIGH (ref 0.61–1.24)
GFR calc Af Amer: 18 mL/min — ABNORMAL LOW (ref >60)
GFR calc non Af Amer: 15 mL/min — ABNORMAL LOW (ref >60)
Glucose, Bld: 185 mg/dL — ABNORMAL HIGH (ref 70–99)
Phosphorus: 2.9 mg/dL (ref 2.5–4.6)
Potassium: 3.8 mmol/L (ref 3.5–5.1)
Sodium: 128 mmol/L — ABNORMAL LOW (ref 135–145)

## 2019-03-16 LAB — CBC
HCT: 30.1 % — ABNORMAL LOW (ref 39.0–52.0)
Hemoglobin: 9.9 g/dL — ABNORMAL LOW (ref 13.0–17.0)
MCH: 28.8 pg (ref 26.0–34.0)
MCHC: 32.9 g/dL (ref 30.0–36.0)
MCV: 87.5 fL (ref 80.0–100.0)
Platelets: 214 10*3/uL (ref 150–400)
RBC: 3.44 MIL/uL — ABNORMAL LOW (ref 4.22–5.81)
RDW: 15.5 % (ref 11.5–15.5)
WBC: 8 10*3/uL (ref 4.0–10.5)
nRBC: 0 % (ref 0.0–0.2)

## 2019-03-16 LAB — MAGNESIUM: Magnesium: 2.2 mg/dL (ref 1.7–2.4)

## 2019-03-16 NOTE — Progress Notes (Signed)
Pulmonary Critical Care Medicine [place]   PULMONARY CRITICAL CARE SERVICE  PROGRESS NOTE  Date of Service: 03/16/2019  Paul Ball  [name]  DOB: [birth year]   DOA: 02/15/2019  Referring Physician: [name], MD  HPI: Paul Ball is a 73 y.o. male seen for follow up of Acute on Chronic Respiratory Failure.  Patient currently is on pressure support mode will going to try T collar today  Medications: Reviewed on Rounds  Physical Exam:  Vitals: Temperature 97.3 pulse 84 respiratory 22 blood pressure 140/60 saturations 97%  Ventilator Settings pressure support FiO2 28% tidal line 502 PEEP 7 pressure poor 12  . General: Comfortable at this time . Eyes: Grossly normal lids, irises & conjunctiva . ENT: grossly tongue is normal . Neck: no obvious mass . Cardiovascular: S1 S2 normal no gallop . Respiratory: No rhonchi coarse breath sounds . Abdomen: soft . Skin: no rash seen on limited exam . Musculoskeletal: not rigid . Psychiatric:unable to assess . Neurologic: no seizure no involuntary movements         Lab Data:   Basic Metabolic Panel: Recent Labs  Lab 03/11/19 0542 03/14/19 0611 03/16/19 0441  NA 127* 127* 128*  K 3.8 3.8 3.8  CL 93* 92* 92*  CO2 23 23 23   GLUCOSE 165* 141* 185*  BUN 73* 82* 72*  CREATININE 3.71* 3.85* 3.71*  CALCIUM 7.9* 8.3* 8.5*  MG  --   --  2.2  PHOS 2.8 2.8 2.9    ABG: No results for input(s): PHART, PCO2ART, PO2ART, HCO3, O2SAT in the last 168 hours.  Liver Function Tests: Recent Labs  Lab 03/11/19 0542 03/14/19 0611 03/16/19 0441  ALBUMIN 1.6* 1.5* 1.5*   No results for input(s): LIPASE, AMYLASE in the last 168 hours. No results for input(s): AMMONIA in the last 168 hours.  CBC: Recent Labs  Lab 03/11/19 0542 03/14/19 0611 03/16/19 0441  WBC 7.4 7.9 8.0  HGB 10.4* 10.2* 9.9*  HCT 31.4* 30.8* 30.1*  MCV 87.2 86.5 87.5  PLT 194 197 214    Cardiac Enzymes: No results for input(s):  CKTOTAL, CKMB, CKMBINDEX, TROPONINI in the last 168 hours.  BNP (last 3 results) No results for input(s): BNP in the last 8760 hours.  ProBNP (last 3 results) No results for input(s): PROBNP in the last 8760 hours.  Radiological Exams: Dg Chest 1 View  Result Date: 03/16/2019 CLINICAL DATA:  73 year old male status post chest tube removal. EXAM: CHEST  1 VIEW COMPARISON:  Earlier radiograph dated 03/16/2019 FINDINGS: There has been interval removal of the pigtail right chest tube. No pneumothorax identified. Dialysis catheter tracheostomy remain in similar position. No interval change in diffuse interstitial and airspace densities. Stable cardiac silhouette. No acute osseous pathology. IMPRESSION: 1. Interval removal of the pigtail right chest tube. No pneumothorax. 2. No interval change in the appearance of the lungs since the earlier radiograph. Electronically Signed   By: Anner Crete M.D.   On: 03/16/2019 09:21   Ir Fluoro Guide Cv Line Right  Result Date: 03/15/2019 CLINICAL DATA:  Renal failure, needs long-term hemodialysis access. Patient has a temporary IJ catheter. EXAM: TUNNELED HEMODIALYSIS CATHETER PLACEMENT WITH ULTRASOUND AND FLUOROSCOPIC GUIDANCE TECHNIQUE: The procedure, risks, benefits, and alternatives were explained to the family. Questions regarding the procedure were encouraged and answered. The family understands and consents to the procedure. As antibiotic prophylaxis, cefazolin 2 g was ordered pre-procedure and administered intravenously within one hour of incision. Patency of the right IJ vein was  confirmed with ultrasound with image documentation. An appropriate skin site was determined. Region was prepped using maximum barrier technique including cap and mask, sterile gown, sterile gloves, large sterile sheet, and Chlorhexidine as cutaneous antisepsis. The region was infiltrated locally with 1% lidocaine. Intravenous Fentanyl 86mcg and Versed 5mg  were administered as  conscious sedation during continuous monitoring of the patient's level of consciousness and physiological / cardiorespiratory status by the radiology RN, with a total moderate sedation time of 14 minutes. Under real-time ultrasound guidance, the right IJ vein was accessed with a 21 gauge micropuncture needle; the needle tip within the vein was confirmed with ultrasound image documentation. Needle exchanged over the 018 guidewire for transitional dilator, which allowed advancement of a Benson wire into the IVC. Over this, an MPA catheter was advanced. A Palindrome 23 hemodialysis catheter was tunneled from the right anterior chest wall approach to the right IJ dermatotomy site. The MPA catheter was exchanged over an Amplatz wire for serial vascular dilators which allow placement of a peel-away sheath, through which the catheter was advanced under intermittent fluoroscopy, positioned with its tips in the proximal and midright atrium. Spot chest radiograph confirms good catheter position. No pneumothorax. Catheter was flushed and primed per protocol. Catheter secured externally with O Prolene sutures. The right IJ dermatotomy site was closed with Dermabond. COMPLICATIONS: COMPLICATIONS None immediate FLUOROSCOPY TIME:  12 seconds; 1 mGy COMPARISON:  None IMPRESSION: 1. Technically successful placement of tunneled right IJ hemodialysis catheter with ultrasound and fluoroscopic guidance. Ready for routine use. ACCESS: Remains approachable for percutaneous intervention as needed. Electronically Signed   By: Lucrezia Europe M.D.   On: 03/15/2019 11:50   Ir US Guide Vasc Access Right  Result Date: 03/15/2019 CLINICAL DATA:  Renal failure, needs long-term hemodialysis access. Patient has a temporary IJ catheter. EXAM: TUNNELED HEMODIALYSIS CATHETER PLACEMENT WITH ULTRASOUND AND FLUOROSCOPIC GUIDANCE TECHNIQUE: The procedure, risks, benefits, and alternatives were explained to the family. Questions regarding the procedure  were encouraged and answered. The family understands and consents to the procedure. As antibiotic prophylaxis, cefazolin 2 g was ordered pre-procedure and administered intravenously within one hour of incision. Patency of the right IJ vein was confirmed with ultrasound with image documentation. An appropriate skin site was determined. Region was prepped using maximum barrier technique including cap and mask, sterile gown, sterile gloves, large sterile sheet, and Chlorhexidine as cutaneous antisepsis. The region was infiltrated locally with 1% lidocaine. Intravenous Fentanyl 26mcg and Versed 5mg  were administered as conscious sedation during continuous monitoring of the patient's level of consciousness and physiological / cardiorespiratory status by the radiology RN, with a total moderate sedation time of 14 minutes. Under real-time ultrasound guidance, the right IJ vein was accessed with a 21 gauge micropuncture needle; the needle tip within the vein was confirmed with ultrasound image documentation. Needle exchanged over the 018 guidewire for transitional dilator, which allowed advancement of a Benson wire into the IVC. Over this, an MPA catheter was advanced. A Palindrome 23 hemodialysis catheter was tunneled from the right anterior chest wall approach to the right IJ dermatotomy site. The MPA catheter was exchanged over an Amplatz wire for serial vascular dilators which allow placement of a peel-away sheath, through which the catheter was advanced under intermittent fluoroscopy, positioned with its tips in the proximal and midright atrium. Spot chest radiograph confirms good catheter position. No pneumothorax. Catheter was flushed and primed per protocol. Catheter secured externally with O Prolene sutures. The right IJ dermatotomy site was closed with Dermabond. COMPLICATIONS:  COMPLICATIONS None immediate FLUOROSCOPY TIME:  12 seconds; 1 mGy COMPARISON:  None IMPRESSION: 1. Technically successful placement of  tunneled right IJ hemodialysis catheter with ultrasound and fluoroscopic guidance. Ready for routine use. ACCESS: Remains approachable for percutaneous intervention as needed. Electronically Signed   By: Lucrezia Europe M.D.   On: 03/15/2019 11:50   Dg Chest Port 1 View  Result Date: 03/16/2019 CLINICAL DATA:  Follow-up pneumothorax. EXAM: PORTABLE CHEST 1 VIEW COMPARISON:  03/14/2019 FINDINGS: There is a right sided dialysis catheter with tips in the cavoatrial junction. Tracheostomy tube tip is in satisfactory position above the carina. Small right apical pneumothorax not seen on today's study. Normal heart size. Unchanged, diffuse bilateral interstitial and airspace opacities. IMPRESSION: 1. No change in aeration a lungs compared with previous exam. 2. Right apical pneumothorax not seen on today's exam. Electronically Signed   By: Kerby Moors M.D.   On: 03/16/2019 07:32    Assessment/Plan Active Problems:   Acute on chronic respiratory failure with hypoxia (HCC)   Acute on chronic systolic and diastolic heart failure, NYHA class 3 (HCC)   Acute renal failure superimposed on stage 3 chronic kidney disease (HCC)   Healthcare-associated pneumonia   COPD, severe ([place])   1. Acute on chronic respiratory failure with hypoxia continue to wean try T collar today 2. Acute on chronic diastolic heart failure diuretics as tolerated. 3. Acute renal failure followed by nephrology 4. Severe COPD at baseline 5. Healthcare associated pneumonia treated we will continue to monitor radiologically chest x-ray still showing no significant changes   I have personally seen and evaluated the patient, evaluated laboratory and imaging results, formulated the assessment and plan and placed orders. The Patient requires high complexity decision making for assessment and support.  Case was discussed on Rounds with the Respiratory Therapy Staff  Allyne Gee, MD [facility] Pulmonary Critical Care Medicine Sleep  Medicine

## 2019-03-16 NOTE — Progress Notes (Addendum)
[facility]  ROUNDING NOTE   Subjective:  Patient now with right IJ PermCath in place. Appreciate assistance from radiology.   Objective:  Vital signs in last 24 hours:  Pulse 77 respirations 20 blood pressure 112/47  Physical Exam: General: Critically ill-appearing  Head: Normocephalic, atraumatic. Moist oral mucosal membranes  Eyes: Anicteric  Neck: Tracheostomy in place  Lungs:  Scattered rhonchi, vent assisted  Heart: S1S2 no rubs  Abdomen:  Soft, nontender, bowel sounds present, PEG in place  Extremities: 3+ peripheral edema.  Neurologic: Not following commands  Skin: No rash  Access: Right internal jugular permcath    Basic Metabolic Panel: Recent Labs  Lab 03/11/19 0542 03/14/19 0611 03/16/19 0441  NA 127* 127* 128*  K 3.8 3.8 3.8  CL 93* 92* 92*  CO2 23 23 23   GLUCOSE 165* 141* 185*  BUN 73* 82* 72*  CREATININE 3.71* 3.85* 3.71*  CALCIUM 7.9* 8.3* 8.5*  MG  --   --  2.2  PHOS 2.8 2.8 2.9    Liver Function Tests: Recent Labs  Lab 03/11/19 0542 03/14/19 0611 03/16/19 0441  ALBUMIN 1.6* 1.5* 1.5*   No results for input(s): LIPASE, AMYLASE in the last 168 hours. No results for input(s): AMMONIA in the last 168 hours.  CBC: Recent Labs  Lab 03/11/19 0542 03/14/19 0611 03/16/19 0441  WBC 7.4 7.9 8.0  HGB 10.4* 10.2* 9.9*  HCT 31.4* 30.8* 30.1*  MCV 87.2 86.5 87.5  PLT 194 197 214    Cardiac Enzymes: No results for input(s): CKTOTAL, CKMB, CKMBINDEX, TROPONINI in the last 168 hours.  BNP: Invalid input(s): POCBNP  CBG: No results for input(s): GLUCAP in the last 168 hours.  Microbiology: Results for orders placed or performed during the hospital encounter of 02/15/19  Culture, Urine     Status: None   Collection Time: 02/15/19 12:52 AM   Specimen: Urine, Random  Result Value Ref Range Status   Specimen Description URINE, RANDOM  Final   Special Requests NONE  Final   Culture   Final    NO GROWTH Performed at [facility], [place] [address], [place], [place] [address]    Report Status 02/17/2019 FINAL  Final  Culture, respiratory (non-expectorated)     Status: None   Collection Time: 02/15/19 10:50 PM   Specimen: Tracheal Aspirate; Respiratory  Result Value Ref Range Status   Specimen Description TRACHEAL ASPIRATE  Final   Special Requests NONE  Final   Gram Stain   Final    NO WBC SEEN RARE GRAM POSITIVE COCCI IN PAIRS Performed at [facility], [address] [place], [place] [address]    Culture MODERATE ENTEROCOCCUS FAECALIS  Final   Report Status 02/19/2019 FINAL  Final   Organism ID, Bacteria ENTEROCOCCUS FAECALIS  Final      Susceptibility   Enterococcus faecalis - MIC*    AMPICILLIN <=2 SENSITIVE Sensitive     VANCOMYCIN 1 SENSITIVE Sensitive     GENTAMICIN SYNERGY RESISTANT Resistant     * MODERATE ENTEROCOCCUS FAECALIS  SARS Coronavirus 2 by RT PCR (hospital order, performed in [facility] hospital lab) Nasopharyngeal Nasopharyngeal Swab     Status: None   Collection Time: 02/16/19  2:20 PM   Specimen: Nasopharyngeal Swab  Result Value Ref Range Status   SARS Coronavirus 2 NEGATIVE NEGATIVE Final    Comment: (NOTE) If result is NEGATIVE SARS-CoV-2 target nucleic acids are NOT DETECTED. The SARS-CoV-2 RNA is generally detectable in  upper and lower  respiratory specimens during the acute phase of infection. The lowest  concentration of SARS-CoV-2 viral copies this assay can detect is 250  copies / mL. A negative result does not preclude SARS-CoV-2 infection  and should not be used as the sole basis for treatment or other  patient management decisions.  A negative result may occur with  improper specimen collection / handling, submission of specimen other  than nasopharyngeal swab, presence of viral mutation(s) within the  areas targeted by this assay, and inadequate number of viral copies  (<250 copies / mL). A negative result must be combined with clinical   observations, patient history, and epidemiological information. If result is POSITIVE SARS-CoV-2 target nucleic acids are DETECTED. The SARS-CoV-2 RNA is generally detectable in upper and lower  respiratory specimens dur ing the acute phase of infection.  Positive  results are indicative of active infection with SARS-CoV-2.  Clinical  correlation with patient history and other diagnostic information is  necessary to determine patient infection status.  Positive results do  not rule out bacterial infection or co-infection with other viruses. If result is PRESUMPTIVE POSTIVE SARS-CoV-2 nucleic acids MAY BE PRESENT.   A presumptive positive result was obtained on the submitted specimen  and confirmed on repeat testing.  While 2019 novel coronavirus  (SARS-CoV-2) nucleic acids may be present in the submitted sample  additional confirmatory testing may be necessary for epidemiological  and / or clinical management purposes  to differentiate between  SARS-CoV-2 and other Sarbecovirus currently known to infect humans.  If clinically indicated additional testing with an alternate test  methodology [phone]) is advised. The SARS-CoV-2 RNA is generally  detectable in upper and lower respiratory sp ecimens during the acute  phase of infection. The expected result is Negative. Fact Sheet for Patients:  StrictlyIdeas.no Fact Sheet for Healthcare Providers: BankingDealers.co.za This test is not yet approved or cleared by the [place] FDA and has been authorized for detection and/or diagnosis of SARS-CoV-2 by FDA under an Emergency Use Authorization (EUA).  This EUA will remain in effect (meaning this test can be used) for the duration of the COVID-19 declaration under Section 564(b)(1) of the Act, 21 U.S.C. section 360bbb-3(b)(1), unless the authorization is terminated or revoked sooner. Performed at [facility], [place] [address],  [place], [place] [address]   SARS CORONAVIRUS 2 (TAT 6-24 HRS) Nasopharyngeal Nasopharyngeal Swab     Status: None   Collection Time: 03/07/19  7:40 AM   Specimen: Nasopharyngeal Swab  Result Value Ref Range Status   SARS Coronavirus 2 NEGATIVE NEGATIVE Final    Comment: (NOTE) SARS-CoV-2 target nucleic acids are NOT DETECTED. The SARS-CoV-2 RNA is generally detectable in upper and lower respiratory specimens during the acute phase of infection. Negative results do not preclude SARS-CoV-2 infection, do not rule out co-infections with other pathogens, and should not be used as the sole basis for treatment or other patient management decisions. Negative results must be combined with clinical observations, patient history, and epidemiological information. The expected result is Negative. Fact Sheet for Patients: SugarRoll.be Fact Sheet for Healthcare Providers: https://www.woods-mathews.com/ This test is not yet approved or cleared by the [place] FDA and  has been authorized for detection and/or diagnosis of SARS-CoV-2 by FDA under an Emergency Use Authorization (EUA). This EUA will remain  in effect (meaning this test can be used) for the duration of the COVID-19 declaration under Section 56 4(b)(1) of the Act, 21 U.S.C. section 360bbb-3(b)(1), unless  the authorization is terminated or revoked sooner. Performed at [facility], [place] [address] [place], [place] [address]   Stat Gram stain     Status: None   Collection Time: 03/11/19 12:46 PM   Specimen: Pleura; Body Fluid  Result Value Ref Range Status   Specimen Description PLEURAL LEFT  Final   Special Requests Normal  Final   Gram Stain   Final    NO WBC SEEN NO ORGANISMS SEEN Performed at [facility], [address], [place], [place] [address]    Report Status 03/11/2019 FINAL  Final  Gram stain     Status: None   Collection Time: 03/12/19 10:37 AM   Specimen: PATH  Cytology Pleural fluid  Result Value Ref Range Status   Specimen Description PLEURAL RIGHT  Final   Special Requests NONE  Final   Gram Stain   Final    RARE WBC PRESENT, PREDOMINANTLY MONONUCLEAR NO ORGANISMS SEEN Performed at [facility], [place] [address] [place], [place] [address]    Report Status 03/12/2019 FINAL  Final  Culture, body fluid-bottle     Status: None (Preliminary result)   Collection Time: 03/12/19 10:37 AM   Specimen: Pleura  Result Value Ref Range Status   Specimen Description PLEURAL RIGHT  Final   Special Requests NONE  Final   Culture   Final    NO GROWTH 4 DAYS Performed at [place] [address] [place], [place] [address]    Report Status PENDING  Incomplete    Coagulation Studies: Recent Labs    03/14/19 0719  LABPROT 16.7*  INR 1.4*    Urinalysis: No results for input(s): COLORURINE, LABSPEC, PHURINE, GLUCOSEU, HGBUR, BILIRUBINUR, KETONESUR, PROTEINUR, UROBILINOGEN, NITRITE, LEUKOCYTESUR in the last 72 hours.  Invalid input(s): APPERANCEUR    Imaging: Ir Fluoro Guide Cv Line Right  Result Date: 03/15/2019 CLINICAL DATA:  Renal failure, needs long-term hemodialysis access. Patient has a temporary IJ catheter. EXAM: TUNNELED HEMODIALYSIS CATHETER PLACEMENT WITH ULTRASOUND AND FLUOROSCOPIC GUIDANCE TECHNIQUE: The procedure, risks, benefits, and alternatives were explained to the family. Questions regarding the procedure were encouraged and answered. The family understands and consents to the procedure. As antibiotic prophylaxis, cefazolin 2 g was ordered pre-procedure and administered intravenously within one hour of incision. Patency of the right IJ vein was confirmed with ultrasound with image documentation. An appropriate skin site was determined. Region was prepped using maximum barrier technique including cap and mask, sterile gown, sterile gloves, large sterile sheet, and Chlorhexidine as cutaneous antisepsis. The region was  infiltrated locally with 1% lidocaine. Intravenous Fentanyl 67mg and Versed 576mwere administered as conscious sedation during continuous monitoring of the patient's level of consciousness and physiological / cardiorespiratory status by the radiology RN, with a total moderate sedation time of 14 minutes. Under real-time ultrasound guidance, the right IJ vein was accessed with a 21 gauge micropuncture needle; the needle tip within the vein was confirmed with ultrasound image documentation. Needle exchanged over the 018 guidewire for transitional dilator, which allowed advancement of a Benson wire into the IVC. Over this, an MPA catheter was advanced. A Palindrome 23 hemodialysis catheter was tunneled from the right anterior chest wall approach to the right IJ dermatotomy site. The MPA catheter was exchanged over an Amplatz wire for serial vascular dilators which allow placement of a peel-away sheath, through which the catheter was advanced under intermittent fluoroscopy, positioned with its tips in the proximal and midright atrium. Spot chest  radiograph confirms good catheter position. No pneumothorax. Catheter was flushed and primed per protocol. Catheter secured externally with O Prolene sutures. The right IJ dermatotomy site was closed with Dermabond. COMPLICATIONS: COMPLICATIONS None immediate FLUOROSCOPY TIME:  12 seconds; 1 mGy COMPARISON:  None IMPRESSION: 1. Technically successful placement of tunneled right IJ hemodialysis catheter with ultrasound and fluoroscopic guidance. Ready for routine use. ACCESS: Remains approachable for percutaneous intervention as needed. Electronically Signed   By: Lucrezia Europe M.D.   On: 03/15/2019 11:50   Ir US Guide Vasc Access Right  Result Date: 03/15/2019 CLINICAL DATA:  Renal failure, needs long-term hemodialysis access. Patient has a temporary IJ catheter. EXAM: TUNNELED HEMODIALYSIS CATHETER PLACEMENT WITH ULTRASOUND AND FLUOROSCOPIC GUIDANCE TECHNIQUE: The procedure,  risks, benefits, and alternatives were explained to the family. Questions regarding the procedure were encouraged and answered. The family understands and consents to the procedure. As antibiotic prophylaxis, cefazolin 2 g was ordered pre-procedure and administered intravenously within one hour of incision. Patency of the right IJ vein was confirmed with ultrasound with image documentation. An appropriate skin site was determined. Region was prepped using maximum barrier technique including cap and mask, sterile gown, sterile gloves, large sterile sheet, and Chlorhexidine as cutaneous antisepsis. The region was infiltrated locally with 1% lidocaine. Intravenous Fentanyl 51mg and Versed 532mwere administered as conscious sedation during continuous monitoring of the patient's level of consciousness and physiological / cardiorespiratory status by the radiology RN, with a total moderate sedation time of 14 minutes. Under real-time ultrasound guidance, the right IJ vein was accessed with a 21 gauge micropuncture needle; the needle tip within the vein was confirmed with ultrasound image documentation. Needle exchanged over the 018 guidewire for transitional dilator, which allowed advancement of a Benson wire into the IVC. Over this, an MPA catheter was advanced. A Palindrome 23 hemodialysis catheter was tunneled from the right anterior chest wall approach to the right IJ dermatotomy site. The MPA catheter was exchanged over an Amplatz wire for serial vascular dilators which allow placement of a peel-away sheath, through which the catheter was advanced under intermittent fluoroscopy, positioned with its tips in the proximal and midright atrium. Spot chest radiograph confirms good catheter position. No pneumothorax. Catheter was flushed and primed per protocol. Catheter secured externally with O Prolene sutures. The right IJ dermatotomy site was closed with Dermabond. COMPLICATIONS: COMPLICATIONS None immediate FLUOROSCOPY  TIME:  12 seconds; 1 mGy COMPARISON:  None IMPRESSION: 1. Technically successful placement of tunneled right IJ hemodialysis catheter with ultrasound and fluoroscopic guidance. Ready for routine use. ACCESS: Remains approachable for percutaneous intervention as needed. Electronically Signed   By: D Lucrezia Europe.D.   On: 03/15/2019 11:50   Dg Chest Port 1 View  Result Date: 03/16/2019 CLINICAL DATA:  Follow-up pneumothorax. EXAM: PORTABLE CHEST 1 VIEW COMPARISON:  03/14/2019 FINDINGS: There is a right sided dialysis catheter with tips in the cavoatrial junction. Tracheostomy tube tip is in satisfactory position above the carina. Small right apical pneumothorax not seen on today's study. Normal heart size. Unchanged, diffuse bilateral interstitial and airspace opacities. IMPRESSION: 1. No change in aeration a lungs compared with previous exam. 2. Right apical pneumothorax not seen on today's exam. Electronically Signed   By: TaKerby Moors.D.   On: 03/16/2019 07:32     Medications:     iohexol  Assessment/ Plan:  [age].o. male with a PMHx of BPH, allergic rhinitis, COPD, diabetes mellitus type 2, hypertension, GERD, acute respiratory failure, congestive heart failure, chronic kidney  disease stage IIIb EGFR 43 who was admitted to [facility] on 02/15/2019 for ongoing management of acute respiratory failure.   1.  Acute kidney injury/chronic kidney disease stage IIIb baseline GFR 43.    Patient continues to have renal dysfunction and is dialysis dependent.  Maintain the patient on dialysis on MWF schedule.  Patient due for dialysis today.  2.  Acute respiratory failure.  FiO2 remained stable at 28%.  Continue ultrafiltration with dialysis to aid in weaning from the ventilator.  He also now has a chest tube in place as he has pneumothorax.  3.  Anemia of chronic kidney disease.  Hemoglobin currently 9.9.  Hold off on Retacrit at this time.  4.  Secondary hyperparathyroidism.  Phosphorus 2.9 and  acceptable.  Continue periodically monitor.  LOS: 0 Dorinne Graeff 11/11/20209:58 AM

## 2019-03-16 NOTE — Progress Notes (Signed)
Supervising Physician: Sandi Mariscal  Patient Status:  [facility] - In-pt  Chief Complaint:  Iatrogenic Pneumothorax  Subjective:  Patient resting comfortably.  Trach/vent  Allergies: Shellfish-derived products  Medications: Prior to Admission medications   Not on File     Vital Signs: BP (!) 139/51    Pulse 91    Temp 97.8 F (36.6 C) (Temporal)    Resp (!) 31    SpO2 99%   Physical Exam Awake Does not respond Trach/vent Right chest tube in place Clear peach colored drainage in pleurevac. Pleurevac almost completely full of fluid. Chest tube removed without difficulty but with + leakage of about 5 mL of clear fluid. Vaseline gauze dressing placed and dressing reinforced with 5 layers of 4x4 gauze and Medipore tape.  Imaging: Dg Chest 1 View  Result Date: 03/13/2019 CLINICAL DATA:  Follow-up pneumothorax, chest tube in place EXAM: CHEST  1 VIEW COMPARISON:  03/12/2019 FINDINGS: Tracheostomy in satisfactory position. Right IJ venous catheter terminates in the upper right atrium. Interstitial/airspace opacities bilaterally, suggesting moderate interstitial edema. Moderate left and small right pleural effusions. Indwelling right pigtail chest tube. Prior right pneumothorax has resolved. Cardiomegaly. IMPRESSION: Indwelling right pigtail chest tube. Prior right pneumothorax is resolved. Cardiomegaly with moderate interstitial edema. Moderate left and small right pleural effusions. Additional support apparatus as above. Electronically Signed   By: Julian Hy M.D.   On: 03/13/2019 13:28   Dg Chest 1 View  Result Date: 03/12/2019 CLINICAL DATA:  S/P Right thora. 1.5 liters removed EXAM: CHEST  1 VIEW COMPARISON:  the previous day's study FINDINGS: Evacuation of right pneumothorax. Small-moderate right pneumothorax, right lung apex projecting over the posterior aspect right third rib. Mild interstitial and alveolar opacities bilaterally, left greater than right, marginally  improved since previous. Heart size normal. No mediastinal shift. Right IJ dialysis catheter and endotracheal tube stable in position. There may be a small residual left pleural effusion. Visualized bones unremarkable. IMPRESSION: 1. Right pneumothorax post right thoracentesis. Electronically Signed   By: Lucrezia Europe M.D.   On: 03/12/2019 11:27   Ir Fluoro Guide Cv Line Right  Result Date: 03/15/2019 CLINICAL DATA:  Renal failure, needs long-term hemodialysis access. Patient has a temporary IJ catheter. EXAM: TUNNELED HEMODIALYSIS CATHETER PLACEMENT WITH ULTRASOUND AND FLUOROSCOPIC GUIDANCE TECHNIQUE: The procedure, risks, benefits, and alternatives were explained to the family. Questions regarding the procedure were encouraged and answered. The family understands and consents to the procedure. As antibiotic prophylaxis, cefazolin 2 g was ordered pre-procedure and administered intravenously within one hour of incision. Patency of the right IJ vein was confirmed with ultrasound with image documentation. An appropriate skin site was determined. Region was prepped using maximum barrier technique including cap and mask, sterile gown, sterile gloves, large sterile sheet, and Chlorhexidine as cutaneous antisepsis. The region was infiltrated locally with 1% lidocaine. Intravenous Fentanyl 50mcg and Versed 5mg  were administered as conscious sedation during continuous monitoring of the patient's level of consciousness and physiological / cardiorespiratory status by the radiology RN, with a total moderate sedation time of 14 minutes. Under real-time ultrasound guidance, the right IJ vein was accessed with a 21 gauge micropuncture needle; the needle tip within the vein was confirmed with ultrasound image documentation. Needle exchanged over the 018 guidewire for transitional dilator, which allowed advancement of a Benson wire into the IVC. Over this, an MPA catheter was advanced. A Palindrome 23 hemodialysis catheter was  tunneled from the right anterior chest wall approach to the right IJ  dermatotomy site. The MPA catheter was exchanged over an Amplatz wire for serial vascular dilators which allow placement of a peel-away sheath, through which the catheter was advanced under intermittent fluoroscopy, positioned with its tips in the proximal and midright atrium. Spot chest radiograph confirms good catheter position. No pneumothorax. Catheter was flushed and primed per protocol. Catheter secured externally with O Prolene sutures. The right IJ dermatotomy site was closed with Dermabond. COMPLICATIONS: COMPLICATIONS None immediate FLUOROSCOPY TIME:  12 seconds; 1 mGy COMPARISON:  None IMPRESSION: 1. Technically successful placement of tunneled right IJ hemodialysis catheter with ultrasound and fluoroscopic guidance. Ready for routine use. ACCESS: Remains approachable for percutaneous intervention as needed. Electronically Signed   By: Lucrezia Europe M.D.   On: 03/15/2019 11:50   Ir US Guide Vasc Access Right  Result Date: 03/15/2019 CLINICAL DATA:  Renal failure, needs long-term hemodialysis access. Patient has a temporary IJ catheter. EXAM: TUNNELED HEMODIALYSIS CATHETER PLACEMENT WITH ULTRASOUND AND FLUOROSCOPIC GUIDANCE TECHNIQUE: The procedure, risks, benefits, and alternatives were explained to the family. Questions regarding the procedure were encouraged and answered. The family understands and consents to the procedure. As antibiotic prophylaxis, cefazolin 2 g was ordered pre-procedure and administered intravenously within one hour of incision. Patency of the right IJ vein was confirmed with ultrasound with image documentation. An appropriate skin site was determined. Region was prepped using maximum barrier technique including cap and mask, sterile gown, sterile gloves, large sterile sheet, and Chlorhexidine as cutaneous antisepsis. The region was infiltrated locally with 1% lidocaine. Intravenous Fentanyl 76mcg and Versed 5mg   were administered as conscious sedation during continuous monitoring of the patient's level of consciousness and physiological / cardiorespiratory status by the radiology RN, with a total moderate sedation time of 14 minutes. Under real-time ultrasound guidance, the right IJ vein was accessed with a 21 gauge micropuncture needle; the needle tip within the vein was confirmed with ultrasound image documentation. Needle exchanged over the 018 guidewire for transitional dilator, which allowed advancement of a Benson wire into the IVC. Over this, an MPA catheter was advanced. A Palindrome 23 hemodialysis catheter was tunneled from the right anterior chest wall approach to the right IJ dermatotomy site. The MPA catheter was exchanged over an Amplatz wire for serial vascular dilators which allow placement of a peel-away sheath, through which the catheter was advanced under intermittent fluoroscopy, positioned with its tips in the proximal and midright atrium. Spot chest radiograph confirms good catheter position. No pneumothorax. Catheter was flushed and primed per protocol. Catheter secured externally with O Prolene sutures. The right IJ dermatotomy site was closed with Dermabond. COMPLICATIONS: COMPLICATIONS None immediate FLUOROSCOPY TIME:  12 seconds; 1 mGy COMPARISON:  None IMPRESSION: 1. Technically successful placement of tunneled right IJ hemodialysis catheter with ultrasound and fluoroscopic guidance. Ready for routine use. ACCESS: Remains approachable for percutaneous intervention as needed. Electronically Signed   By: Lucrezia Europe M.D.   On: 03/15/2019 11:50   Ir US Guide Bx Asp/drain  Result Date: 03/15/2019 INDICATION: Moderate right pneumothorax after thoracentesis, intubated on positive pressure ventilation EXAM: RIGHT CHEST TUBE PLACEMENT UNDER FLUOROSCOPY MEDICATIONS: The patient is currently admitted to the hospital and receiving intravenous antibiotics. The antibiotics were administered within an  appropriate time frame prior to the initiation of the procedure. ANESTHESIA/SEDATION: Intravenous Fentanyl 58mcg and Versed 1mg  were administered as conscious sedation during continuous monitoring of the patient's level of consciousness and physiological / cardiorespiratory status by the radiology RN, with a total moderate sedation time of 10 minutes. PROCEDURE:  Informed written consent was obtained from the family after a thorough discussion of the procedural risks, benefits and alternatives. All questions were addressed. Maximal Sterile Barrier Technique was utilized including caps, mask, sterile gowns, sterile gloves, sterile drape, hand hygiene and skin antiseptic. A timeout was performed prior to the initiation of the procedure. The right lateral thorax was prepped with chlorhexidine, draped in usual sterile fashion, infiltrated with 1% lidocaine. An appropriate skin entry site was determined fluoroscopically 18 gauge percutaneous entry needle advanced into the pleural space. Air could be aspirated in an Amplatz wire advanced easily, position confirmed on fluoroscopy. Tract dilated to facilitate placement of a 14 French pigtail drain catheter, placed laterally directed towards the apex. Catheter was secured externally with 0 Prolene suture and StatLock and placed to Pleur-evac -20 cm H2O suction. The patient tolerated the procedure well. FLUOROSCOPY TIME:  0.2 minutes; 17  uGym2 DAP COMPLICATIONS: None immediate. IMPRESSION: 1. Technically successful right chest tube placement under fluoroscopy. Electronically Signed   By: Lucrezia Europe M.D.   On: 03/12/2019 16:15   Dg Chest Port 1 View  Result Date: 03/16/2019 CLINICAL DATA:  Follow-up pneumothorax. EXAM: PORTABLE CHEST 1 VIEW COMPARISON:  03/14/2019 FINDINGS: There is a right sided dialysis catheter with tips in the cavoatrial junction. Tracheostomy tube tip is in satisfactory position above the carina. Small right apical pneumothorax not seen on today's  study. Normal heart size. Unchanged, diffuse bilateral interstitial and airspace opacities. IMPRESSION: 1. No change in aeration a lungs compared with previous exam. 2. Right apical pneumothorax not seen on today's exam. Electronically Signed   By: Kerby Moors M.D.   On: 03/16/2019 07:32   Dg Chest Port 1 View  Result Date: 03/14/2019 CLINICAL DATA:  Pneumothorax. Chest tube in situ. EXAM: PORTABLE CHEST 1 VIEW COMPARISON:  One-view chest x-ray 03/13/2019 FINDINGS: A tiny apical pneumothorax is evident on today's study. Tracheostomy tube is stable. Right IJ line is stable. The heart is mildly enlarged. Atherosclerotic changes are noted at the aorta. A shifting interstitial pattern is consistent with edema or ARDS. Marland Kitchen IMPRESSION: 1. Tiny right apical pneumothorax has recurred. 2. Increasing interstitial pattern compatible with edema or ARDS. 3. Stable tracheostomy tube and right IJ line. Electronically Signed   By: San Morelle M.D.   On: 03/14/2019 07:38   US Thoracentesis Asp Pleural Space W/img Guide  Result Date: 03/12/2019 INDICATION: Respiratory failure requiring tracheostomy and mechanical ventilation. Large right pleural effusion. Request for diagnostic and therapeutic thoracentesis. EXAM: ULTRASOUND GUIDED RIGHT THORACENTESIS MEDICATIONS: 1% lidocaine 10 mL COMPLICATIONS: SIR LEVEL B - Normal therapy, includes overnight admission for observation. PROCEDURE: An ultrasound guided thoracentesis was thoroughly discussed with the patient and questions answered. The benefits, risks, alternatives and complications were also discussed. The patient understands and wishes to proceed with the procedure. Written consent was obtained. Ultrasound was performed to localize and mark an adequate pocket of fluid in the right chest. The area was then prepped and draped in the normal sterile fashion. 1% Lidocaine was used for local anesthesia. Under ultrasound guidance a catheter was introduced. Thoracentesis  was performed. The catheter was removed and a dressing applied. FINDINGS: A total of approximately 1.7 L of amber fluid was removed. Samples were sent to the laboratory as requested by the clinical team. IMPRESSION: Successful ultrasound guided right thoracentesis yielding 1.7 L of pleural fluid. Moderate pneumothorax on post-procedure chest x-ray. Given patient is on mechanical ventilation will proceed with placement of pigtail chest tube. Read by: Gareth Eagle, PA-C  Electronically Signed   By: Lucrezia Europe M.D.   On: 03/12/2019 11:55    Labs:  CBC: Recent Labs    03/09/19 0502 03/11/19 0542 03/14/19 0611 03/16/19 0441  WBC 7.8 7.4 7.9 8.0  HGB 11.4* 10.4* 10.2* 9.9*  HCT 34.3* 31.4* 30.8* 30.1*  PLT 169 194 197 214    COAGS: Recent Labs    02/16/19 0658 03/14/19 0719  INR 1.3* 1.4*    BMP: Recent Labs    03/09/19 0502 03/11/19 0542 03/14/19 0611 03/16/19 0441  NA 129* 127* 127* 128*  K 3.8 3.8 3.8 3.8  CL 92* 93* 92* 92*  CO2 25 23 23 23   GLUCOSE 183* 165* 141* 185*  BUN 81* 73* 82* 72*  CALCIUM 8.1* 7.9* 8.3* 8.5*  CREATININE 4.10* 3.71* 3.85* 3.71*  GFRNONAA 14* 15* 15* 15*  GFRAA 16* 18* 17* 18*    LIVER FUNCTION TESTS: Recent Labs    02/16/19 0658  03/09/19 0502 03/11/19 0542 03/14/19 0611 03/16/19 0441  BILITOT 1.1  --   --   --   --   --   AST 21  --   --   --   --   --   ALT 15  --   --   --   --   --   ALKPHOS 112  --   --   --   --   --   PROT 6.0*  --   --   --   --   --   ALBUMIN 1.9*   < > 1.7* 1.6* 1.5* 1.5*   < > = values in this interval not displayed.    Assessment and Plan:  Iatrogenic pneumothorax.  Chest tube removed without difficulty.  Will obtain STAT CXR.  Electronically Signed: Murrell Redden, PA-C 03/16/2019, 8:55 AM    I spent a total of 15 Minutes at the the patient's bedside AND on the patient's hospital floor or unit, greater than 50% of which was counseling/coordinating care for chest tube removal.

## 2019-03-17 DIAGNOSIS — I5043 Acute on chronic combined systolic (congestive) and diastolic (congestive) heart failure: Secondary | ICD-10-CM | POA: Diagnosis not present

## 2019-03-17 DIAGNOSIS — J449 Chronic obstructive pulmonary disease, unspecified: Secondary | ICD-10-CM | POA: Diagnosis not present

## 2019-03-17 DIAGNOSIS — N179 Acute kidney failure, unspecified: Secondary | ICD-10-CM | POA: Diagnosis not present

## 2019-03-17 DIAGNOSIS — J9621 Acute and chronic respiratory failure with hypoxia: Secondary | ICD-10-CM | POA: Diagnosis not present

## 2019-03-17 LAB — CULTURE, BODY FLUID W GRAM STAIN -BOTTLE: Culture: NO GROWTH

## 2019-03-17 NOTE — Progress Notes (Signed)
Pulmonary Critical Care Medicine [place]   PULMONARY CRITICAL CARE SERVICE  PROGRESS NOTE  Date of Service: 03/17/2019  Armend Baumhardt  [id number]  DOB: 08-20-1945   DOA: 02/15/2019  Referring Physician: [name], MD  HPI: Hiroto Kemna is a 73 y.o. male seen for follow up of Acute on Chronic Respiratory Failure.  Patient has been tolerating weaning fairly well right now is resting on assist control mode patient restarted on T collar again  Medications: Reviewed on Rounds  Physical Exam:  Vitals: Temperature 97.5 pulse 88 respiratory 25 blood pressure 190/69 saturations 97%  Ventilator Settings mode ventilation assist control FiO2 35% tidal line 420 PEEP 7  . General: Comfortable at this time . Eyes: Grossly normal lids, irises & conjunctiva . ENT: grossly tongue is normal . Neck: no obvious mass . Cardiovascular: S1 S2 normal no gallop . Respiratory: No rhonchi no rales noted . Abdomen: soft . Skin: no rash seen on limited exam . Musculoskeletal: not rigid . Psychiatric:unable to assess . Neurologic: no seizure no involuntary movements         Lab Data:   Basic Metabolic Panel: Recent Labs  Lab 03/11/19 0542 03/14/19 0611 03/16/19 0441  NA 127* 127* 128*  K 3.8 3.8 3.8  CL 93* 92* 92*  CO2 23 23 23   GLUCOSE 165* 141* 185*  BUN 73* 82* 72*  CREATININE 3.71* 3.85* 3.71*  CALCIUM 7.9* 8.3* 8.5*  MG  --   --  2.2  PHOS 2.8 2.8 2.9    ABG: No results for input(s): PHART, PCO2ART, PO2ART, HCO3, O2SAT in the last 168 hours.  Liver Function Tests: Recent Labs  Lab 03/11/19 0542 03/14/19 0611 03/16/19 0441  ALBUMIN 1.6* 1.5* 1.5*   No results for input(s): LIPASE, AMYLASE in the last 168 hours. No results for input(s): AMMONIA in the last 168 hours.  CBC: Recent Labs  Lab 03/11/19 0542 03/14/19 0611 03/16/19 0441  WBC 7.4 7.9 8.0  HGB 10.4* 10.2* 9.9*  HCT 31.4* 30.8* 30.1*  MCV 87.2 86.5 87.5  PLT 194 197 214     Cardiac Enzymes: No results for input(s): CKTOTAL, CKMB, CKMBINDEX, TROPONINI in the last 168 hours.  BNP (last 3 results) No results for input(s): BNP in the last 8760 hours.  ProBNP (last 3 results) No results for input(s): PROBNP in the last 8760 hours.  Radiological Exams: Dg Chest 1 View  Result Date: 03/16/2019 CLINICAL DATA:  73 year old male status post chest tube removal. EXAM: CHEST  1 VIEW COMPARISON:  Earlier radiograph dated 03/16/2019 FINDINGS: There has been interval removal of the pigtail right chest tube. No pneumothorax identified. Dialysis catheter tracheostomy remain in similar position. No interval change in diffuse interstitial and airspace densities. Stable cardiac silhouette. No acute osseous pathology. IMPRESSION: 1. Interval removal of the pigtail right chest tube. No pneumothorax. 2. No interval change in the appearance of the lungs since the earlier radiograph. Electronically Signed   By: Anner Crete M.D.   On: 03/16/2019 09:21   Ir Fluoro Guide Cv Line Right  Result Date: 03/15/2019 CLINICAL DATA:  Renal failure, needs long-term hemodialysis access. Patient has a temporary IJ catheter. EXAM: TUNNELED HEMODIALYSIS CATHETER PLACEMENT WITH ULTRASOUND AND FLUOROSCOPIC GUIDANCE TECHNIQUE: The procedure, risks, benefits, and alternatives were explained to the family. Questions regarding the procedure were encouraged and answered. The family understands and consents to the procedure. As antibiotic prophylaxis, cefazolin 2 g was ordered pre-procedure and administered intravenously within one hour of incision. Patency  of the right IJ vein was confirmed with ultrasound with image documentation. An appropriate skin site was determined. Region was prepped using maximum barrier technique including cap and mask, sterile gown, sterile gloves, large sterile sheet, and Chlorhexidine as cutaneous antisepsis. The region was infiltrated locally with 1% lidocaine. Intravenous  Fentanyl 41mcg and Versed 5mg  were administered as conscious sedation during continuous monitoring of the patient's level of consciousness and physiological / cardiorespiratory status by the radiology RN, with a total moderate sedation time of 14 minutes. Under real-time ultrasound guidance, the right IJ vein was accessed with a 21 gauge micropuncture needle; the needle tip within the vein was confirmed with ultrasound image documentation. Needle exchanged over the 018 guidewire for transitional dilator, which allowed advancement of a Benson wire into the IVC. Over this, an MPA catheter was advanced. A Palindrome 23 hemodialysis catheter was tunneled from the right anterior chest wall approach to the right IJ dermatotomy site. The MPA catheter was exchanged over an Amplatz wire for serial vascular dilators which allow placement of a peel-away sheath, through which the catheter was advanced under intermittent fluoroscopy, positioned with its tips in the proximal and midright atrium. Spot chest radiograph confirms good catheter position. No pneumothorax. Catheter was flushed and primed per protocol. Catheter secured externally with O Prolene sutures. The right IJ dermatotomy site was closed with Dermabond. COMPLICATIONS: COMPLICATIONS None immediate FLUOROSCOPY TIME:  12 seconds; 1 mGy COMPARISON:  None IMPRESSION: 1. Technically successful placement of tunneled right IJ hemodialysis catheter with ultrasound and fluoroscopic guidance. Ready for routine use. ACCESS: Remains approachable for percutaneous intervention as needed. Electronically Signed   By: Lucrezia Europe M.D.   On: 03/15/2019 11:50   Ir US Guide Vasc Access Right  Result Date: 03/15/2019 CLINICAL DATA:  Renal failure, needs long-term hemodialysis access. Patient has a temporary IJ catheter. EXAM: TUNNELED HEMODIALYSIS CATHETER PLACEMENT WITH ULTRASOUND AND FLUOROSCOPIC GUIDANCE TECHNIQUE: The procedure, risks, benefits, and alternatives were explained  to the family. Questions regarding the procedure were encouraged and answered. The family understands and consents to the procedure. As antibiotic prophylaxis, cefazolin 2 g was ordered pre-procedure and administered intravenously within one hour of incision. Patency of the right IJ vein was confirmed with ultrasound with image documentation. An appropriate skin site was determined. Region was prepped using maximum barrier technique including cap and mask, sterile gown, sterile gloves, large sterile sheet, and Chlorhexidine as cutaneous antisepsis. The region was infiltrated locally with 1% lidocaine. Intravenous Fentanyl 7mcg and Versed 5mg  were administered as conscious sedation during continuous monitoring of the patient's level of consciousness and physiological / cardiorespiratory status by the radiology RN, with a total moderate sedation time of 14 minutes. Under real-time ultrasound guidance, the right IJ vein was accessed with a 21 gauge micropuncture needle; the needle tip within the vein was confirmed with ultrasound image documentation. Needle exchanged over the 018 guidewire for transitional dilator, which allowed advancement of a Benson wire into the IVC. Over this, an MPA catheter was advanced. A Palindrome 23 hemodialysis catheter was tunneled from the right anterior chest wall approach to the right IJ dermatotomy site. The MPA catheter was exchanged over an Amplatz wire for serial vascular dilators which allow placement of a peel-away sheath, through which the catheter was advanced under intermittent fluoroscopy, positioned with its tips in the proximal and midright atrium. Spot chest radiograph confirms good catheter position. No pneumothorax. Catheter was flushed and primed per protocol. Catheter secured externally with O Prolene sutures. The right IJ dermatotomy  site was closed with Dermabond. COMPLICATIONS: COMPLICATIONS None immediate FLUOROSCOPY TIME:  12 seconds; 1 mGy COMPARISON:  None  IMPRESSION: 1. Technically successful placement of tunneled right IJ hemodialysis catheter with ultrasound and fluoroscopic guidance. Ready for routine use. ACCESS: Remains approachable for percutaneous intervention as needed. Electronically Signed   By: Lucrezia Europe M.D.   On: 03/15/2019 11:50   Dg Chest Port 1 View  Result Date: 03/16/2019 CLINICAL DATA:  Follow-up pneumothorax. EXAM: PORTABLE CHEST 1 VIEW COMPARISON:  03/14/2019 FINDINGS: There is a right sided dialysis catheter with tips in the cavoatrial junction. Tracheostomy tube tip is in satisfactory position above the carina. Small right apical pneumothorax not seen on today's study. Normal heart size. Unchanged, diffuse bilateral interstitial and airspace opacities. IMPRESSION: 1. No change in aeration a lungs compared with previous exam. 2. Right apical pneumothorax not seen on today's exam. Electronically Signed   By: Kerby Moors M.D.   On: 03/16/2019 07:32    Assessment/Plan Active Problems:   Acute on chronic respiratory failure with hypoxia (HCC)   Acute on chronic systolic and diastolic heart failure, NYHA class 3 (HCC)   Acute renal failure superimposed on stage 3 chronic kidney disease (HCC)   Healthcare-associated pneumonia   COPD, severe ([place])   1. Acute on chronic respiratory failure hypoxia we will continue with full support on assist control right now is on 35% FiO2 with a PEEP of 7 patient will be reassessed again in weaned on T collar 2. Acute on chronic diastolic heart failure monitor fluid status x-ray still does not look optimal 3. Acute renal failure followed by nephrology 4. Healthcare associated pneumonia treated 5. Severe COPD we will continue with supportive care   I have personally seen and evaluated the patient, evaluated laboratory and imaging results, formulated the assessment and plan and placed orders. The Patient requires high complexity decision making for assessment and support.  Case was  discussed on Rounds with the Respiratory Therapy Staff  Allyne Gee, MD [facility] Pulmonary Critical Care Medicine Sleep Medicine

## 2019-03-18 DIAGNOSIS — I5043 Acute on chronic combined systolic (congestive) and diastolic (congestive) heart failure: Secondary | ICD-10-CM | POA: Diagnosis not present

## 2019-03-18 DIAGNOSIS — J449 Chronic obstructive pulmonary disease, unspecified: Secondary | ICD-10-CM | POA: Diagnosis not present

## 2019-03-18 DIAGNOSIS — N179 Acute kidney failure, unspecified: Secondary | ICD-10-CM | POA: Diagnosis not present

## 2019-03-18 DIAGNOSIS — J9621 Acute and chronic respiratory failure with hypoxia: Secondary | ICD-10-CM | POA: Diagnosis not present

## 2019-03-18 LAB — CBC
HCT: 27.3 % — ABNORMAL LOW (ref 39.0–52.0)
Hemoglobin: 9.1 g/dL — ABNORMAL LOW (ref 13.0–17.0)
MCH: 28.4 pg (ref 26.0–34.0)
MCHC: 33.3 g/dL (ref 30.0–36.0)
MCV: 85.3 fL (ref 80.0–100.0)
Platelets: 204 10*3/uL (ref 150–400)
RBC: 3.2 MIL/uL — ABNORMAL LOW (ref 4.22–5.81)
RDW: 15.3 % (ref 11.5–15.5)
WBC: 8 10*3/uL (ref 4.0–10.5)
nRBC: 0 % (ref 0.0–0.2)

## 2019-03-18 LAB — RENAL FUNCTION PANEL
Albumin: 1.7 g/dL — ABNORMAL LOW (ref 3.5–5.0)
Anion gap: 12 (ref 5–15)
BUN: 63 mg/dL — ABNORMAL HIGH (ref 8–23)
CO2: 25 mmol/L (ref 22–32)
Calcium: 8.4 mg/dL — ABNORMAL LOW (ref 8.9–10.3)
Chloride: 94 mmol/L — ABNORMAL LOW (ref 98–111)
Creatinine, Ser: 3.05 mg/dL — ABNORMAL HIGH (ref 0.61–1.24)
GFR calc Af Amer: 23 mL/min — ABNORMAL LOW (ref >60)
GFR calc non Af Amer: 19 mL/min — ABNORMAL LOW (ref >60)
Glucose, Bld: 172 mg/dL — ABNORMAL HIGH (ref 70–99)
Phosphorus: 2.2 mg/dL — ABNORMAL LOW (ref 2.5–4.6)
Potassium: 3.9 mmol/L (ref 3.5–5.1)
Sodium: 131 mmol/L — ABNORMAL LOW (ref 135–145)

## 2019-03-18 NOTE — Progress Notes (Signed)
Pulmonary Critical Care Medicine [place]   PULMONARY CRITICAL CARE SERVICE  PROGRESS NOTE  Date of Service: 03/18/2019  Paul Ball  [name]  DOB: Jan 04, 1946   DOA: 02/15/2019  Referring Physician: [name], MD  HPI: Paul Ball is a 72 y.o. male seen for follow up of Acute on Chronic Respiratory Failure.  Patient currently is on full support on assist control mode has been on 35% FiO2 no changes are noted  Medications: Reviewed on Rounds  Physical Exam:  Vitals: Temperature 99.2 pulse 100 respiratory rate 22 blood pressure 163/59 saturations 98%  Ventilator Settings mode of ventilation assist control FiO2 35% tidal volume 444 PEEP 7  . General: Comfortable at this time . Eyes: Grossly normal lids, irises & conjunctiva . ENT: grossly tongue is normal . Neck: no obvious mass . Cardiovascular: S1 S2 normal no gallop . Respiratory: No rhonchi no rales are noted at this time . Abdomen: soft . Skin: no rash seen on limited exam . Musculoskeletal: not rigid . Psychiatric:unable to assess . Neurologic: no seizure no involuntary movements         Lab Data:   Basic Metabolic Panel: Recent Labs  Lab 03/14/19 0611 03/16/19 0441 03/18/19 0550  NA 127* 128* 131*  K 3.8 3.8 3.9  CL 92* 92* 94*  CO2 23 23 25   GLUCOSE 141* 185* 172*  BUN 82* 72* 63*  CREATININE 3.85* 3.71* 3.05*  CALCIUM 8.3* 8.5* 8.4*  MG  --  2.2  --   PHOS 2.8 2.9 2.2*    ABG: No results for input(s): PHART, PCO2ART, PO2ART, HCO3, O2SAT in the last 168 hours.  Liver Function Tests: Recent Labs  Lab 03/14/19 0611 03/16/19 0441 03/18/19 0550  ALBUMIN 1.5* 1.5* 1.7*   No results for input(s): LIPASE, AMYLASE in the last 168 hours. No results for input(s): AMMONIA in the last 168 hours.  CBC: Recent Labs  Lab 03/14/19 0611 03/16/19 0441 03/18/19 0550  WBC 7.9 8.0 8.0  HGB 10.2* 9.9* 9.1*  HCT 30.8* 30.1* 27.3*  MCV 86.5 87.5 85.3  PLT 197 214 204     Cardiac Enzymes: No results for input(s): CKTOTAL, CKMB, CKMBINDEX, TROPONINI in the last 168 hours.  BNP (last 3 results) No results for input(s): BNP in the last 8760 hours.  ProBNP (last 3 results) No results for input(s): PROBNP in the last 8760 hours.  Radiological Exams: No results found.  Assessment/Plan Active Problems:   Acute on chronic respiratory failure with hypoxia (HCC)   Acute on chronic systolic and diastolic heart failure, NYHA class 3 (HCC)   Acute renal failure superimposed on stage 3 chronic kidney disease (HCC)   Healthcare-associated pneumonia   COPD, severe ([place])   1. Acute on chronic respiratory failure hypoxia we will continue with full support on the vent patient was attempted on the RSB I did not tolerate still significant fluid overload noted 2. Acute on chronic systolic heart failure we will continue to monitor fluid status closely per nephrology recommendations 3. Acute renal failure being followed by nephrology 4. Healthcare associated pneumonia treated 5. Severe COPD continue with present management   I have personally seen and evaluated the patient, evaluated laboratory and imaging results, formulated the assessment and plan and placed orders. The Patient requires high complexity decision making for assessment and support.  Case was discussed on Rounds with the Respiratory Therapy Staff  Allyne Gee, MD [facility] Pulmonary Critical Care Medicine Sleep Medicine

## 2019-03-18 NOTE — Progress Notes (Signed)
[facility]  ROUNDING NOTE   Subjective:  Patient due for hemodialysis today. Remains on the ventilator at this time.    Objective:  Vital signs in last 24 hours:  Temperature 99.3 pulse 100 respirations 23 blood pressure 163/59  Physical Exam: General: Critically ill-appearing  Head: Normocephalic, atraumatic. Moist oral mucosal membranes  Eyes: Anicteric  Neck: Tracheostomy in place  Lungs:  Scattered rhonchi, vent assisted  Heart: S1S2 no rubs  Abdomen:  Soft, nontender, bowel sounds present, PEG in place  Extremities: 2+ peripheral edema.  Neurologic: Not following commands  Skin: No rash  Access: Right internal jugular permcath    Basic Metabolic Panel: Recent Labs  Lab 03/14/19 0611 03/16/19 0441 03/18/19 0550  NA 127* 128* 131*  K 3.8 3.8 3.9  CL 92* 92* 94*  CO2 _0 GLUCOSE 141* 185* 172*  BUN 82* 72* 63*  CREATININE 3.85* 3.71* 3.05*  CALCIUM 8.3* 8.5* 8.4*  MG  --  2.2  --   PHOS 2.8 2.9 2.2*    Liver Function Tests: Recent Labs  Lab 03/14/19 0611 03/16/19 0441 03/18/19 0550  ALBUMIN 1.5* 1.5* 1.7*   No results for input(s): LIPASE, AMYLASE in the last 168 hours. No results for input(s): AMMONIA in the last 168 hours.  CBC: Recent Labs  Lab 03/14/19 0611 03/16/19 0441 03/18/19 0550  WBC 7.9 8.0 8.0  HGB 10.2* 9.9* 9.1*  HCT 30.8* 30.1* 27.3*  MCV 86.5 87.5 85.3  PLT 197 214 204    Cardiac Enzymes: No results for input(s): CKTOTAL, CKMB, CKMBINDEX, TROPONINI in the last 168 hours.  BNP: Invalid input(s): POCBNP  CBG: No results for input(s): GLUCAP in the last 168 hours.  Microbiology: Results for orders placed or performed during the hospital encounter of 02/15/19  Culture, Urine     Status: None   Collection Time: 02/15/19 12:52 AM   Specimen: Urine, Random  Result Value Ref Range Status   Specimen Description URINE, RANDOM  Final   Special Requests NONE  Final   Culture   Final    NO  GROWTH Performed at [facility], [place] [address] [place], [place] [address]    Report Status 02/17/2019 FINAL  Final  Culture, respiratory (non-expectorated)     Status: None   Collection Time: 02/15/19 10:50 PM   Specimen: Tracheal Aspirate; Respiratory  Result Value Ref Range Status   Specimen Description TRACHEAL ASPIRATE  Final   Special Requests NONE  Final   Gram Stain   Final    NO WBC SEEN RARE GRAM POSITIVE COCCI IN PAIRS Performed at [facility], [address] [place], [place] [address]    Culture MODERATE ENTEROCOCCUS FAECALIS  Final   Report Status 02/19/2019 FINAL  Final   Organism ID, Bacteria ENTEROCOCCUS FAECALIS  Final      Susceptibility   Enterococcus faecalis - MIC*    AMPICILLIN <=2 SENSITIVE Sensitive     VANCOMYCIN 1 SENSITIVE Sensitive     GENTAMICIN SYNERGY RESISTANT Resistant     * MODERATE ENTEROCOCCUS FAECALIS  SARS Coronavirus 2 by RT PCR (hospital order, performed in [place] hospital lab) Nasopharyngeal Nasopharyngeal Swab     Status: None   Collection Time: 02/16/19  2:20 PM   Specimen: Nasopharyngeal Swab  Result Value Ref Range Status   SARS Coronavirus 2 NEGATIVE NEGATIVE Final    Comment: (NOTE) If result is NEGATIVE SARS-CoV-2 target nucleic acids are NOT DETECTED. The SARS-CoV-2 RNA is  generally detectable in upper and lower  respiratory specimens during the acute phase of infection. The lowest  concentration of SARS-CoV-2 viral copies this assay can detect is 250  copies / mL. A negative result does not preclude SARS-CoV-2 infection  and should not be used as the sole basis for treatment or other  patient management decisions.  A negative result may occur with  improper specimen collection / handling, submission of specimen other  than nasopharyngeal swab, presence of viral mutation(s) within the  areas targeted by this assay, and inadequate number of viral copies  (<250 copies / mL). A negative result must be combined  with clinical  observations, patient history, and epidemiological information. If result is POSITIVE SARS-CoV-2 target nucleic acids are DETECTED. The SARS-CoV-2 RNA is generally detectable in upper and lower  respiratory specimens dur ing the acute phase of infection.  Positive  results are indicative of active infection with SARS-CoV-2.  Clinical  correlation with patient history and other diagnostic information is  necessary to determine patient infection status.  Positive results do  not rule out bacterial infection or co-infection with other viruses. If result is PRESUMPTIVE POSTIVE SARS-CoV-2 nucleic acids MAY BE PRESENT.   A presumptive positive result was obtained on the submitted specimen  and confirmed on repeat testing.  While 2019 novel coronavirus  (SARS-CoV-2) nucleic acids may be present in the submitted sample  additional confirmatory testing may be necessary for epidemiological  and / or clinical management purposes  to differentiate between  SARS-CoV-2 and other Sarbecovirus currently known to infect humans.  If clinically indicated additional testing with an alternate test  methodology [phone]) is advised. The SARS-CoV-2 RNA is generally  detectable in upper and lower respiratory sp ecimens during the acute  phase of infection. The expected result is Negative. Fact Sheet for Patients:  StrictlyIdeas.no Fact Sheet for Healthcare Providers: BankingDealers.co.za This test is not yet approved or cleared by the [place] FDA and has been authorized for detection and/or diagnosis of SARS-CoV-2 by FDA under an Emergency Use Authorization (EUA).  This EUA will remain in effect (meaning this test can be used) for the duration of the COVID-19 declaration under Section 564(b)(1) of the Act, 21 U.S.C. section 360bbb-3(b)(1), unless the authorization is terminated or revoked sooner. Performed at [facility], [place] [address], [place], [place] [address]   SARS CORONAVIRUS 2 (TAT 6-24 HRS) Nasopharyngeal Nasopharyngeal Swab     Status: None   Collection Time: 03/07/19  7:40 AM   Specimen: Nasopharyngeal Swab  Result Value Ref Range Status   SARS Coronavirus 2 NEGATIVE NEGATIVE Final    Comment: (NOTE) SARS-CoV-2 target nucleic acids are NOT DETECTED. The SARS-CoV-2 RNA is generally detectable in upper and lower respiratory specimens during the acute phase of infection. Negative results do not preclude SARS-CoV-2 infection, do not rule out co-infections with other pathogens, and should not be used as the sole basis for treatment or other patient management decisions. Negative results must be combined with clinical observations, patient history, and epidemiological information. The expected result is Negative. Fact Sheet for Patients: SugarRoll.be Fact Sheet for Healthcare Providers: https://www.woods-mathews.com/ This test is not yet approved or cleared by the [place] FDA and  has been authorized for detection and/or diagnosis of SARS-CoV-2 by FDA under an Emergency Use Authorization (EUA). This EUA will remain  in effect (meaning this test can be used) for the duration of the COVID-19 declaration under Section 56 4(b)(1) of the Act, 21 U.S.C.  section 360bbb-3(b)(1), unless the authorization is terminated or revoked sooner. Performed at [facility], [place] [address] [place], [place] [address]   Stat Gram stain     Status: None   Collection Time: 03/11/19 12:46 PM   Specimen: Pleura; Body Fluid  Result Value Ref Range Status   Specimen Description PLEURAL LEFT  Final   Special Requests Normal  Final   Gram Stain   Final    NO WBC SEEN NO ORGANISMS SEEN Performed at [facility], [address], [place], [place] [address]    Report Status 03/11/2019 FINAL  Final  Gram stain     Status: None   Collection Time: 03/12/19 10:37 AM    Specimen: PATH Cytology Pleural fluid  Result Value Ref Range Status   Specimen Description PLEURAL RIGHT  Final   Special Requests NONE  Final   Gram Stain   Final    RARE WBC PRESENT, PREDOMINANTLY MONONUCLEAR NO ORGANISMS SEEN Performed at [facility], [place] [address], [place], [place] [address]    Report Status 03/12/2019 FINAL  Final  Culture, body fluid-bottle     Status: None   Collection Time: 03/12/19 10:37 AM   Specimen: Pleura  Result Value Ref Range Status   Specimen Description PLEURAL RIGHT  Final   Special Requests NONE  Final   Culture   Final    NO GROWTH 5 DAYS Performed at [place] [address] [place], [place] [address]    Report Status 03/17/2019 FINAL  Final    Coagulation Studies: No results for input(s): LABPROT, INR in the last 72 hours.  Urinalysis: No results for input(s): COLORURINE, LABSPEC, PHURINE, GLUCOSEU, HGBUR, BILIRUBINUR, KETONESUR, PROTEINUR, UROBILINOGEN, NITRITE, LEUKOCYTESUR in the last 72 hours.  Invalid input(s): APPERANCEUR    Imaging: Dg Chest 1 View  Result Date: 03/16/2019 CLINICAL DATA:  73 year old male status post chest tube removal. EXAM: CHEST  1 VIEW COMPARISON:  Earlier radiograph dated 03/16/2019 FINDINGS: There has been interval removal of the pigtail right chest tube. No pneumothorax identified. Dialysis catheter tracheostomy remain in similar position. No interval change in diffuse interstitial and airspace densities. Stable cardiac silhouette. No acute osseous pathology. IMPRESSION: 1. Interval removal of the pigtail right chest tube. No pneumothorax. 2. No interval change in the appearance of the lungs since the earlier radiograph. Electronically Signed   By: Anner Crete M.D.   On: 03/16/2019 09:21     Medications:     iohexol  Assessment/ Plan:  73 y.o. male with a PMHx of BPH, allergic rhinitis, COPD, diabetes mellitus type 2, hypertension, GERD, acute respiratory failure, congestive  heart failure, chronic kidney disease stage IIIb EGFR 43 who was admitted to [facility] on 02/15/2019 for ongoing management of acute respiratory failure.   1.  Acute kidney injury/chronic kidney disease stage IIIb baseline GFR 43.    Patient due for hemodialysis today.  Vitals have been prepared.  Ultrafiltration target 2 to 3.5 mg.  2.  Acute respiratory failure.  Patient remains on the ventilator at this time.  FiO2 currently 45%.  Continue ventilatory support.  3.  Anemia of chronic kidney disease.  Hemoglobin at the moment down slightly to 9.1.  Continue to monitor.  4.  Secondary hyperparathyroidism.  Phosphorus 2.2 at last check.  Continue to monitor bone renal metabolism parameters..  LOS: 0 Rie Mcneil 11/13/20207:44 AM

## 2019-03-19 DIAGNOSIS — J9621 Acute and chronic respiratory failure with hypoxia: Secondary | ICD-10-CM | POA: Diagnosis not present

## 2019-03-19 DIAGNOSIS — J449 Chronic obstructive pulmonary disease, unspecified: Secondary | ICD-10-CM | POA: Diagnosis not present

## 2019-03-19 DIAGNOSIS — I5043 Acute on chronic combined systolic (congestive) and diastolic (congestive) heart failure: Secondary | ICD-10-CM | POA: Diagnosis not present

## 2019-03-19 DIAGNOSIS — N179 Acute kidney failure, unspecified: Secondary | ICD-10-CM | POA: Diagnosis not present

## 2019-03-19 NOTE — Progress Notes (Addendum)
Pulmonary Critical Care Medicine [place]   PULMONARY CRITICAL CARE SERVICE  PROGRESS NOTE  Date of Service: 03/19/2019  Paul Ball  [id number]  DOB: 04-30-1946   DOA: 02/15/2019  Referring Physician: [name], MD  HPI: Paul Ball is a 73 y.o. male seen for follow up of Acute on Chronic Respiratory Failure.  Patient is a 16-hour goal today on pressure support currently satting well no fever or distress.  Medications: Reviewed on Rounds  Physical Exam:  Vitals: Pulse 103 respirations 13 BP 102/65 O2 sat 99% temp 96.0  Ventilator Settings pressure support 12 5 FiO2 40%  . General: Comfortable at this time . Eyes: Grossly normal lids, irises & conjunctiva . ENT: grossly tongue is normal . Neck: no obvious mass . Cardiovascular: S1 S2 normal no gallop . Respiratory: No rales or rhonchi noted . Abdomen: soft . Skin: no rash seen on limited exam . Musculoskeletal: not rigid . Psychiatric:unable to assess . Neurologic: no seizure no involuntary movements         Lab Data:   Basic Metabolic Panel: Recent Labs  Lab 03/14/19 0611 03/16/19 0441 03/18/19 0550  NA 127* 128* 131*  K 3.8 3.8 3.9  CL 92* 92* 94*  CO2 23 23 25   GLUCOSE 141* 185* 172*  BUN 82* 72* 63*  CREATININE 3.85* 3.71* 3.05*  CALCIUM 8.3* 8.5* 8.4*  MG  --  2.2  --   PHOS 2.8 2.9 2.2*    ABG: No results for input(s): PHART, PCO2ART, PO2ART, HCO3, O2SAT in the last 168 hours.  Liver Function Tests: Recent Labs  Lab 03/14/19 0611 03/16/19 0441 03/18/19 0550  ALBUMIN 1.5* 1.5* 1.7*   No results for input(s): LIPASE, AMYLASE in the last 168 hours. No results for input(s): AMMONIA in the last 168 hours.  CBC: Recent Labs  Lab 03/14/19 0611 03/16/19 0441 03/18/19 0550  WBC 7.9 8.0 8.0  HGB 10.2* 9.9* 9.1*  HCT 30.8* 30.1* 27.3*  MCV 86.5 87.5 85.3  PLT 197 214 204    Cardiac Enzymes: No results for input(s): CKTOTAL, CKMB, CKMBINDEX, TROPONINI in the  last 168 hours.  BNP (last 3 results) No results for input(s): BNP in the last 8760 hours.  ProBNP (last 3 results) No results for input(s): PROBNP in the last 8760 hours.  Radiological Exams: No results found.  Assessment/Plan Active Problems:   Acute on chronic respiratory failure with hypoxia (HCC)   Acute on chronic systolic and diastolic heart failure, NYHA class 3 (HCC)   Acute renal failure superimposed on stage 3 chronic kidney disease (HCC)   Healthcare-associated pneumonia   COPD, severe ([place])   1. Acute on chronic respiratory failure hypoxia patient will continue to wean per protocol.  Current goal of 16 hours on pressure support.  Continue aggressive pulmonary toilet supportive measures. 2. Acute on chronic systolic heart failure we will continue to monitor fluid status closely per nephrology recommendations 3. Acute renal failure being followed by nephrology 4. Healthcare associated pneumonia treated 5. Severe COPD continue with present management   I have personally seen and evaluated the patient, evaluated laboratory and imaging results, formulated the assessment and plan and placed orders. The Patient requires high complexity decision making for assessment and support.  Case was discussed on Rounds with the Respiratory Therapy Staff  Allyne Gee, MD [facility] Pulmonary Critical Care Medicine Sleep Medicine

## 2019-03-20 DIAGNOSIS — J449 Chronic obstructive pulmonary disease, unspecified: Secondary | ICD-10-CM | POA: Diagnosis not present

## 2019-03-20 DIAGNOSIS — J9621 Acute and chronic respiratory failure with hypoxia: Secondary | ICD-10-CM | POA: Diagnosis not present

## 2019-03-20 DIAGNOSIS — I5043 Acute on chronic combined systolic (congestive) and diastolic (congestive) heart failure: Secondary | ICD-10-CM | POA: Diagnosis not present

## 2019-03-20 DIAGNOSIS — N179 Acute kidney failure, unspecified: Secondary | ICD-10-CM | POA: Diagnosis not present

## 2019-03-20 NOTE — Progress Notes (Addendum)
Pulmonary Critical Care Medicine [place]   PULMONARY CRITICAL CARE SERVICE  PROGRESS NOTE  Date of Service: 03/20/2019  Paul Ball  [id number]  DOB: March 06, 1946   DOA: 02/15/2019  Referring Physician: [name], MD  HPI: Paul Ball is a 73 y.o. male seen for follow up of Acute on Chronic Respiratory Failure.  Patient completed 14 hours on per support yesterday and is back on pressure support today for goal of 16 hours.  Medications: Reviewed on Rounds  Physical Exam:  Vitals: Pulse 91 respirations 18 BP 159/93 O2 sat 100% temp 97.4  Ventilator Settings pressure support over 5 FiO2 of 40%  . General: Comfortable at this time . Eyes: Grossly normal lids, irises & conjunctiva . ENT: grossly tongue is normal . Neck: no obvious mass . Cardiovascular: S1 S2 normal no gallop . Respiratory: No rales or rhonchi noted . Abdomen: soft . Skin: no rash seen on limited exam . Musculoskeletal: not rigid . Psychiatric:unable to assess . Neurologic: no seizure no involuntary movements         Lab Data:   Basic Metabolic Panel: Recent Labs  Lab 03/14/19 0611 03/16/19 0441 03/18/19 0550  NA 127* 128* 131*  K 3.8 3.8 3.9  CL 92* 92* 94*  CO2 23 23 25   GLUCOSE 141* 185* 172*  BUN 82* 72* 63*  CREATININE 3.85* 3.71* 3.05*  CALCIUM 8.3* 8.5* 8.4*  MG  --  2.2  --   PHOS 2.8 2.9 2.2*    ABG: No results for input(s): PHART, PCO2ART, PO2ART, HCO3, O2SAT in the last 168 hours.  Liver Function Tests: Recent Labs  Lab 03/14/19 0611 03/16/19 0441 03/18/19 0550  ALBUMIN 1.5* 1.5* 1.7*   No results for input(s): LIPASE, AMYLASE in the last 168 hours. No results for input(s): AMMONIA in the last 168 hours.  CBC: Recent Labs  Lab 03/14/19 0611 03/16/19 0441 03/18/19 0550  WBC 7.9 8.0 8.0  HGB 10.2* 9.9* 9.1*  HCT 30.8* 30.1* 27.3*  MCV 86.5 87.5 85.3  PLT 197 214 204    Cardiac Enzymes: No results for input(s): CKTOTAL, CKMB,  CKMBINDEX, TROPONINI in the last 168 hours.  BNP (last 3 results) No results for input(s): BNP in the last 8760 hours.  ProBNP (last 3 results) No results for input(s): PROBNP in the last 8760 hours.  Radiological Exams: No results found.  Assessment/Plan Active Problems:   Acute on chronic respiratory failure with hypoxia (HCC)   Acute on chronic systolic and diastolic heart failure, NYHA class 3 (HCC)   Acute renal failure superimposed on stage 3 chronic kidney disease (HCC)   Healthcare-associated pneumonia   COPD, severe ([place])   1. Acute on chronic respiratory failure hypoxia patient will continue to wean per protocol.  Current goal of 16 hours on pressure support.  Continue aggressive pulmonary toilet supportive measures. 2. Acute on chronic systolic heart failure we will continue to monitor fluid status closely per nephrology recommendations 3. Acute renal failure being followed by nephrology 4. Healthcare associated pneumonia treated 5. Severe COPD continue with present management   I have personally seen and evaluated the patient, evaluated laboratory and imaging results, formulated the assessment and plan and placed orders. The Patient requires high complexity decision making for assessment and support.  Case was discussed on Rounds with the Respiratory Therapy Staff  Allyne Gee, MD [facility] Pulmonary Critical Care Medicine Sleep Medicine

## 2019-03-21 ENCOUNTER — Other Ambulatory Visit (HOSPITAL_COMMUNITY): Payer: Medicare Other

## 2019-03-21 DIAGNOSIS — J449 Chronic obstructive pulmonary disease, unspecified: Secondary | ICD-10-CM | POA: Diagnosis not present

## 2019-03-21 DIAGNOSIS — N179 Acute kidney failure, unspecified: Secondary | ICD-10-CM | POA: Diagnosis not present

## 2019-03-21 DIAGNOSIS — J9621 Acute and chronic respiratory failure with hypoxia: Secondary | ICD-10-CM | POA: Diagnosis not present

## 2019-03-21 DIAGNOSIS — I5043 Acute on chronic combined systolic (congestive) and diastolic (congestive) heart failure: Secondary | ICD-10-CM | POA: Diagnosis not present

## 2019-03-21 LAB — CBC
HCT: 28 % — ABNORMAL LOW (ref 39.0–52.0)
Hemoglobin: 9.1 g/dL — ABNORMAL LOW (ref 13.0–17.0)
MCH: 28.1 pg (ref 26.0–34.0)
MCHC: 32.5 g/dL (ref 30.0–36.0)
MCV: 86.4 fL (ref 80.0–100.0)
Platelets: 225 10*3/uL (ref 150–400)
RBC: 3.24 MIL/uL — ABNORMAL LOW (ref 4.22–5.81)
RDW: 15.6 % — ABNORMAL HIGH (ref 11.5–15.5)
WBC: 9.8 10*3/uL (ref 4.0–10.5)
nRBC: 0 % (ref 0.0–0.2)

## 2019-03-21 LAB — RENAL FUNCTION PANEL
Albumin: 1.7 g/dL — ABNORMAL LOW (ref 3.5–5.0)
Anion gap: 11 (ref 5–15)
BUN: 88 mg/dL — ABNORMAL HIGH (ref 8–23)
CO2: 25 mmol/L (ref 22–32)
Calcium: 9.1 mg/dL (ref 8.9–10.3)
Chloride: 95 mmol/L — ABNORMAL LOW (ref 98–111)
Creatinine, Ser: 3.46 mg/dL — ABNORMAL HIGH (ref 0.61–1.24)
GFR calc Af Amer: 19 mL/min — ABNORMAL LOW (ref >60)
GFR calc non Af Amer: 17 mL/min — ABNORMAL LOW (ref >60)
Glucose, Bld: 166 mg/dL — ABNORMAL HIGH (ref 70–99)
Phosphorus: 3.2 mg/dL (ref 2.5–4.6)
Potassium: 4.1 mmol/L (ref 3.5–5.1)
Sodium: 131 mmol/L — ABNORMAL LOW (ref 135–145)

## 2019-03-21 NOTE — Progress Notes (Signed)
Pulmonary Critical Care Medicine [place]   PULMONARY CRITICAL CARE SERVICE  PROGRESS NOTE  Date of Service: 03/21/2019  Paul Ball  [id number]  DOB: 09/15/1945   DOA: 02/15/2019  Referring Physician: [name], MD  HPI: Paul Ball is a 73 y.o. male seen for follow up of Acute on Chronic Respiratory Failure.  Patient currently is on full support was attempted on pressure support and did not tolerate it became more diaphoretic.  Last chest x-ray was showing increased pleural effusion also  Medications: Reviewed on Rounds  Physical Exam:  Vitals: Temperature 96.6 pulse 76 respiratory rate 18 blood pressure 145/55 saturations 99%  Ventilator Settings mode ventilation assist control FiO2 40% tidal volume 410 PEEP 5  . General: Comfortable at this time . Eyes: Grossly normal lids, irises & conjunctiva . ENT: grossly tongue is normal . Neck: no obvious mass . Cardiovascular: S1 S2 normal no gallop . Respiratory: No rhonchi no rales are noted at this time . Abdomen: soft . Skin: no rash seen on limited exam . Musculoskeletal: not rigid . Psychiatric:unable to assess . Neurologic: no seizure no involuntary movements         Lab Data:   Basic Metabolic Panel: Recent Labs  Lab 03/16/19 0441 03/18/19 0550 03/21/19 0639  NA 128* 131* 131*  K 3.8 3.9 4.1  CL 92* 94* 95*  CO2 23 25 25   GLUCOSE 185* 172* 166*  BUN 72* 63* 88*  CREATININE 3.71* 3.05* 3.46*  CALCIUM 8.5* 8.4* 9.1  MG 2.2  --   --   PHOS 2.9 2.2* 3.2    ABG: No results for input(s): PHART, PCO2ART, PO2ART, HCO3, O2SAT in the last 168 hours.  Liver Function Tests: Recent Labs  Lab 03/16/19 0441 03/18/19 0550 03/21/19 0639  ALBUMIN 1.5* 1.7* 1.7*   No results for input(s): LIPASE, AMYLASE in the last 168 hours. No results for input(s): AMMONIA in the last 168 hours.  CBC: Recent Labs  Lab 03/16/19 0441 03/18/19 0550 03/21/19 0639  WBC 8.0 8.0 9.8  HGB 9.9* 9.1*  9.1*  HCT 30.1* 27.3* 28.0*  MCV 87.5 85.3 86.4  PLT 214 204 225    Cardiac Enzymes: No results for input(s): CKTOTAL, CKMB, CKMBINDEX, TROPONINI in the last 168 hours.  BNP (last 3 results) No results for input(s): BNP in the last 8760 hours.  ProBNP (last 3 results) No results for input(s): PROBNP in the last 8760 hours.  Radiological Exams: Dg Chest Port 1 View  Result Date: 03/21/2019 CLINICAL DATA:  Pleural effusion EXAM: PORTABLE CHEST 1 VIEW COMPARISON:  March 16, 2019 FINDINGS: Tracheostomy catheter tip is 6.6 cm above the carina. Central catheter tip is in the superior vena cava near the cavoatrial junction. No demonstrable pneumothorax. There is a fairly small left pleural effusion. There is diffuse interstitial edema. There is patchy airspace opacity in the right mid lung region and left base regions with an overall increase in airspace opacity compared to previous study. There is cardiomegaly with pulmonary venous hypertension. No adenopathy. No bone lesions. IMPRESSION: Tube and catheter positions unchanged without evident pneumothorax. Fairly small left pleural effusion. Cardiomegaly with pulmonary vascular congestion. Interstitial and patchy alveolar opacity, likely edema. Suspect a degree of congestive heart failure. There may well be a degree of superimposed pneumonia and/or ARDS. Note that there is an overall slight increase in opacity in the right mid lung and left base compared to most recent study. Electronically Signed   By: Lowella Grip III  M.D.   On: 03/21/2019 08:05    Assessment/Plan Active Problems:   Acute on chronic respiratory failure with hypoxia (HCC)   Acute on chronic systolic and diastolic heart failure, NYHA class 3 (HCC)   Acute renal failure superimposed on stage 3 chronic kidney disease ([place])   Healthcare-associated pneumonia   COPD, severe ([place])   1. Acute on chronic respiratory failure with hypoxia we will continue with full support on  the vent for now.  Patient supposed to get dialyzed today chest x-ray still showing significant edema on the left side along with effusion 2. Acute on chronic systolic heart failure needs more fluid removal nephrology is following the patient 3. Acute on chronic renal failure on hemodialysis 4. Healthcare associated pneumonia worsening effusion on the left 5. Severe COPD patient is not back to baseline continue with supportive care   I have personally seen and evaluated the patient, evaluated laboratory and imaging results, formulated the assessment and plan and placed orders. The Patient requires high complexity decision making for assessment and support.  Case was discussed on Rounds with the Respiratory Therapy Staff  Allyne Gee, MD [facility] Pulmonary Critical Care Medicine Sleep Medicine

## 2019-03-21 NOTE — Progress Notes (Signed)
[facility]  ROUNDING NOTE   Subjective:  Patient remains critically ill. Still on the ventilator. Due for dialysis today.    Objective:  Vital signs in last 24 hours:  Temperature 96 pulse 76 respirations 18 blood pressure 145/55  Physical Exam: General: Critically ill-appearing  Head: Normocephalic, atraumatic. Moist oral mucosal membranes  Eyes: Anicteric  Neck: Tracheostomy in place  Lungs:  Scattered rhonchi, vent assisted  Heart: S1S2 no rubs  Abdomen:  Soft, nontender, bowel sounds present, PEG in place  Extremities: 2+ peripheral edema.  Neurologic: Not following commands  Skin: No rash  Access: Right internal jugular permcath    Basic Metabolic Panel: Recent Labs  Lab 03/16/19 0441 03/18/19 0550 03/21/19 0639  NA 128* 131* 131*  K 3.8 3.9 4.1  CL 92* 94* 95*  CO2 _0 GLUCOSE 185* 172* 166*  BUN 72* 63* 88*  CREATININE 3.71* 3.05* 3.46*  CALCIUM 8.5* 8.4* 9.1  MG 2.2  --   --   PHOS 2.9 2.2* 3.2    Liver Function Tests: Recent Labs  Lab 03/16/19 0441 03/18/19 0550 03/21/19 0639  ALBUMIN 1.5* 1.7* 1.7*   No results for input(s): LIPASE, AMYLASE in the last 168 hours. No results for input(s): AMMONIA in the last 168 hours.  CBC: Recent Labs  Lab 03/16/19 0441 03/18/19 0550 03/21/19 0639  WBC 8.0 8.0 9.8  HGB 9.9* 9.1* 9.1*  HCT 30.1* 27.3* 28.0*  MCV 87.5 85.3 86.4  PLT 214 204 225    Cardiac Enzymes: No results for input(s): CKTOTAL, CKMB, CKMBINDEX, TROPONINI in the last 168 hours.  BNP: Invalid input(s): POCBNP  CBG: No results for input(s): GLUCAP in the last 168 hours.  Microbiology: Results for orders placed or performed during the hospital encounter of 02/15/19  Culture, Urine     Status: None   Collection Time: 02/15/19 12:52 AM   Specimen: Urine, Random  Result Value Ref Range Status   Specimen Description URINE, RANDOM  Final   Special Requests NONE  Final   Culture   Final    NO  GROWTH Performed at [facility], [place] [address], [place], [place] [address]    Report Status 02/17/2019 FINAL  Final  Culture, respiratory (non-expectorated)     Status: None   Collection Time: 02/15/19 10:50 PM   Specimen: Tracheal Aspirate; Respiratory  Result Value Ref Range Status   Specimen Description TRACHEAL ASPIRATE  Final   Special Requests NONE  Final   Gram Stain   Final    NO WBC SEEN RARE GRAM POSITIVE COCCI IN PAIRS Performed at [facility], [address], [place], [place] [address]    Culture MODERATE ENTEROCOCCUS FAECALIS  Final   Report Status 02/19/2019 FINAL  Final   Organism ID, Bacteria ENTEROCOCCUS FAECALIS  Final      Susceptibility   Enterococcus faecalis - MIC*    AMPICILLIN <=2 SENSITIVE Sensitive     VANCOMYCIN 1 SENSITIVE Sensitive     GENTAMICIN SYNERGY RESISTANT Resistant     * MODERATE ENTEROCOCCUS FAECALIS  SARS Coronavirus 2 by RT PCR (hospital order, performed in [place] hospital lab) Nasopharyngeal Nasopharyngeal Swab     Status: None   Collection Time: 02/16/19  2:20 PM   Specimen: Nasopharyngeal Swab  Result Value Ref Range Status   SARS Coronavirus 2 NEGATIVE NEGATIVE Final    Comment: (NOTE) If result is NEGATIVE SARS-CoV-2 target nucleic acids are NOT DETECTED. The SARS-CoV-2 RNA is  generally detectable in upper and lower  respiratory specimens during the acute phase of infection. The lowest  concentration of SARS-CoV-2 viral copies this assay can detect is 250  copies / mL. A negative result does not preclude SARS-CoV-2 infection  and should not be used as the sole basis for treatment or other  patient management decisions.  A negative result may occur with  improper specimen collection / handling, submission of specimen other  than nasopharyngeal swab, presence of viral mutation(s) within the  areas targeted by this assay, and inadequate number of viral copies  (<250 copies / mL). A negative result must be combined  with clinical  observations, patient history, and epidemiological information. If result is POSITIVE SARS-CoV-2 target nucleic acids are DETECTED. The SARS-CoV-2 RNA is generally detectable in upper and lower  respiratory specimens dur ing the acute phase of infection.  Positive  results are indicative of active infection with SARS-CoV-2.  Clinical  correlation with patient history and other diagnostic information is  necessary to determine patient infection status.  Positive results do  not rule out bacterial infection or co-infection with other viruses. If result is PRESUMPTIVE POSTIVE SARS-CoV-2 nucleic acids MAY BE PRESENT.   A presumptive positive result was obtained on the submitted specimen  and confirmed on repeat testing.  While 2019 novel coronavirus  (SARS-CoV-2) nucleic acids may be present in the submitted sample  additional confirmatory testing may be necessary for epidemiological  and / or clinical management purposes  to differentiate between  SARS-CoV-2 and other Sarbecovirus currently known to infect humans.  If clinically indicated additional testing with an alternate test  methodology [phone]) is advised. The SARS-CoV-2 RNA is generally  detectable in upper and lower respiratory sp ecimens during the acute  phase of infection. The expected result is Negative. Fact Sheet for Patients:  StrictlyIdeas.no Fact Sheet for Healthcare Providers: BankingDealers.co.za This test is not yet approved or cleared by the [place] FDA and has been authorized for detection and/or diagnosis of SARS-CoV-2 by FDA under an Emergency Use Authorization (EUA).  This EUA will remain in effect (meaning this test can be used) for the duration of the COVID-19 declaration under Section 564(b)(1) of the Act, 21 U.S.C. section 360bbb-3(b)(1), unless the authorization is terminated or revoked sooner. Performed at [facility], [place] [address], [place], [place] [address]   SARS CORONAVIRUS 2 (TAT 6-24 HRS) Nasopharyngeal Nasopharyngeal Swab     Status: None   Collection Time: 03/07/19  7:40 AM   Specimen: Nasopharyngeal Swab  Result Value Ref Range Status   SARS Coronavirus 2 NEGATIVE NEGATIVE Final    Comment: (NOTE) SARS-CoV-2 target nucleic acids are NOT DETECTED. The SARS-CoV-2 RNA is generally detectable in upper and lower respiratory specimens during the acute phase of infection. Negative results do not preclude SARS-CoV-2 infection, do not rule out co-infections with other pathogens, and should not be used as the sole basis for treatment or other patient management decisions. Negative results must be combined with clinical observations, patient history, and epidemiological information. The expected result is Negative. Fact Sheet for Patients: SugarRoll.be Fact Sheet for Healthcare Providers: https://www.woods-mathews.com/ This test is not yet approved or cleared by the [place] FDA and  has been authorized for detection and/or diagnosis of SARS-CoV-2 by FDA under an Emergency Use Authorization (EUA). This EUA will remain  in effect (meaning this test can be used) for the duration of the COVID-19 declaration under Section 56 4(b)(1) of the Act, 21 U.S.C.  section 360bbb-3(b)(1), unless the authorization is terminated or revoked sooner. Performed at [facility], [place] [address] [place], [place] [address]   Stat Gram stain     Status: None   Collection Time: 03/11/19 12:46 PM   Specimen: Pleura; Body Fluid  Result Value Ref Range Status   Specimen Description PLEURAL LEFT  Final   Special Requests Normal  Final   Gram Stain   Final    NO WBC SEEN NO ORGANISMS SEEN Performed at [facility], [address] [place], [place] [address]    Report Status 03/11/2019 FINAL  Final  Gram stain     Status: None   Collection Time: 03/12/19 10:37 AM    Specimen: PATH Cytology Pleural fluid  Result Value Ref Range Status   Specimen Description PLEURAL RIGHT  Final   Special Requests NONE  Final   Gram Stain   Final    RARE WBC PRESENT, PREDOMINANTLY MONONUCLEAR NO ORGANISMS SEEN Performed at [facility], [place] [address] [place], [place] [address]    Report Status 03/12/2019 FINAL  Final  Culture, body fluid-bottle     Status: None   Collection Time: 03/12/19 10:37 AM   Specimen: Pleura  Result Value Ref Range Status   Specimen Description PLEURAL RIGHT  Final   Special Requests NONE  Final   Culture   Final    NO GROWTH 5 DAYS Performed at [facility] [address] [place], [place] [address]    Report Status 03/17/2019 FINAL  Final    Coagulation Studies: No results for input(s): LABPROT, INR in the last 72 hours.  Urinalysis: No results for input(s): COLORURINE, LABSPEC, PHURINE, GLUCOSEU, HGBUR, BILIRUBINUR, KETONESUR, PROTEINUR, UROBILINOGEN, NITRITE, LEUKOCYTESUR in the last 72 hours.  Invalid input(s): APPERANCEUR    Imaging: No results found.   Medications:     iohexol  Assessment/ Plan:  73 y.o. male with a PMHx of BPH, allergic rhinitis, COPD, diabetes mellitus type 2, hypertension, GERD, acute respiratory failure, congestive heart failure, chronic kidney disease stage IIIb EGFR 43 who was admitted to [facility] on 02/15/2019 for ongoing management of acute respiratory failure.   1.  Acute kidney injury/chronic kidney disease stage IIIb baseline GFR 43.    Patient remains dialysis dependent at this time.  We will plan for hemodialysis today.  Ultrafiltration target 2 to 2.5 kg.  2.  Acute respiratory failure.  Patient maintained on ventilatory support.  Pulmonary/critical care following.  3.  Anemia of chronic kidney disease.  Hemoglobin currently 9.1.  Continue to monitor CBC.  4.  Secondary hyperparathyroidism.  Phosphorus acceptable at 3.2.  Continue to monitor bone mineral metabolism  parameters.  LOS: 0 Chai Verdejo 11/16/20208:06 AM

## 2019-03-22 DIAGNOSIS — J449 Chronic obstructive pulmonary disease, unspecified: Secondary | ICD-10-CM | POA: Diagnosis not present

## 2019-03-22 DIAGNOSIS — N179 Acute kidney failure, unspecified: Secondary | ICD-10-CM | POA: Diagnosis not present

## 2019-03-22 DIAGNOSIS — J9621 Acute and chronic respiratory failure with hypoxia: Secondary | ICD-10-CM | POA: Diagnosis not present

## 2019-03-22 DIAGNOSIS — I5043 Acute on chronic combined systolic (congestive) and diastolic (congestive) heart failure: Secondary | ICD-10-CM | POA: Diagnosis not present

## 2019-03-22 LAB — HEPATITIS B SURFACE ANTIGEN: Hepatitis B Surface Ag: NEGATIVE — AB

## 2019-03-22 NOTE — Progress Notes (Addendum)
Pulmonary Critical Care Medicine [place]   PULMONARY CRITICAL CARE SERVICE  PROGRESS NOTE  Date of Service: 03/22/2019  Paul Ball  [id number]  DOB: 28-Aug-1945   DOA: 02/15/2019  Referring Physician: [name], MD  HPI: Paul Ball is a 73 y.o. male seen for follow up of Acute on Chronic Respiratory Failure.  Patient had a pressure support goal of 16 hours today however after about 3 hours had increased respiratory rate in the 40s and was placed back on support ventilator.  Currently satting well.  Medications: Reviewed on Rounds  Physical Exam:  Vitals: Pulse 82 respirations 19 BP 139/47 O2 sat 98% temp 97.6  Ventilator Settings ventilator mode AC VC rate of 16 tidal 05/08/2018 PEEP of 5 and FiO2 35%  . General: Comfortable at this time . Eyes: Grossly normal lids, irises & conjunctiva . ENT: grossly tongue is normal . Neck: no obvious mass . Cardiovascular: S1 S2 normal no gallop . Respiratory: No rales or rhonchi noted . Abdomen: soft . Skin: no rash seen on limited exam . Musculoskeletal: not rigid . Psychiatric:unable to assess . Neurologic: no seizure no involuntary movements         Lab Data:   Basic Metabolic Panel: Recent Labs  Lab 03/16/19 0441 03/18/19 0550 03/21/19 0639  NA 128* 131* 131*  K 3.8 3.9 4.1  CL 92* 94* 95*  CO2 23 25 25   GLUCOSE 185* 172* 166*  BUN 72* 63* 88*  CREATININE 3.71* 3.05* 3.46*  CALCIUM 8.5* 8.4* 9.1  MG 2.2  --   --   PHOS 2.9 2.2* 3.2    ABG: No results for input(s): PHART, PCO2ART, PO2ART, HCO3, O2SAT in the last 168 hours.  Liver Function Tests: Recent Labs  Lab 03/16/19 0441 03/18/19 0550 03/21/19 0639  ALBUMIN 1.5* 1.7* 1.7*   No results for input(s): LIPASE, AMYLASE in the last 168 hours. No results for input(s): AMMONIA in the last 168 hours.  CBC: Recent Labs  Lab 03/16/19 0441 03/18/19 0550 03/21/19 0639  WBC 8.0 8.0 9.8  HGB 9.9* 9.1* 9.1*  HCT 30.1* 27.3* 28.0*   MCV 87.5 85.3 86.4  PLT 214 204 225    Cardiac Enzymes: No results for input(s): CKTOTAL, CKMB, CKMBINDEX, TROPONINI in the last 168 hours.  BNP (last 3 results) No results for input(s): BNP in the last 8760 hours.  ProBNP (last 3 results) No results for input(s): PROBNP in the last 8760 hours.  Radiological Exams: Dg Chest Port 1 View  Result Date: 03/21/2019 CLINICAL DATA:  Pleural effusion EXAM: PORTABLE CHEST 1 VIEW COMPARISON:  March 16, 2019 FINDINGS: Tracheostomy catheter tip is 6.6 cm above the carina. Central catheter tip is in the superior vena cava near the cavoatrial junction. No demonstrable pneumothorax. There is a fairly small left pleural effusion. There is diffuse interstitial edema. There is patchy airspace opacity in the right mid lung region and left base regions with an overall increase in airspace opacity compared to previous study. There is cardiomegaly with pulmonary venous hypertension. No adenopathy. No bone lesions. IMPRESSION: Tube and catheter positions unchanged without evident pneumothorax. Fairly small left pleural effusion. Cardiomegaly with pulmonary vascular congestion. Interstitial and patchy alveolar opacity, likely edema. Suspect a degree of congestive heart failure. There may well be a degree of superimposed pneumonia and/or ARDS. Note that there is an overall slight increase in opacity in the right mid lung and left base compared to most recent study. Electronically Signed   By: Gwyndolyn Saxon  Jasmine December III M.D.   On: 03/21/2019 08:05    Assessment/Plan Active Problems:   Acute on chronic respiratory failure with hypoxia (HCC)   Acute on chronic systolic and diastolic heart failure, NYHA class 3 (HCC)   Acute renal failure superimposed on stage 3 chronic kidney disease (HCC)   Healthcare-associated pneumonia   COPD, severe ([place])   1. Acute on chronic respiratory failure with hypoxia we will continue with full support on the vent for now.    Patient  failed pressure support wean today 2. Acute on chronic systolic heart failure needs more fluid removal nephrology is following the patient 3. Acute on chronic renal failure on hemodialysis 4. Healthcare associated pneumonia worsening effusion on the left 5. Severe COPD patient is not back to baseline continue with supportive care   I have personally seen and evaluated the patient, evaluated laboratory and imaging results, formulated the assessment and plan and placed orders. The Patient requires high complexity decision making for assessment and support.  Case was discussed on Rounds with the Respiratory Therapy Staff  Allyne Gee, MD [facility] Pulmonary Critical Care Medicine Sleep Medicine

## 2019-03-23 DIAGNOSIS — J9621 Acute and chronic respiratory failure with hypoxia: Secondary | ICD-10-CM | POA: Diagnosis not present

## 2019-03-23 DIAGNOSIS — N179 Acute kidney failure, unspecified: Secondary | ICD-10-CM | POA: Diagnosis not present

## 2019-03-23 DIAGNOSIS — I5043 Acute on chronic combined systolic (congestive) and diastolic (congestive) heart failure: Secondary | ICD-10-CM | POA: Diagnosis not present

## 2019-03-23 DIAGNOSIS — J449 Chronic obstructive pulmonary disease, unspecified: Secondary | ICD-10-CM | POA: Diagnosis not present

## 2019-03-23 LAB — CBC
HCT: 24.7 % — ABNORMAL LOW (ref 39.0–52.0)
Hemoglobin: 8.3 g/dL — ABNORMAL LOW (ref 13.0–17.0)
MCH: 28.6 pg (ref 26.0–34.0)
MCHC: 33.6 g/dL (ref 30.0–36.0)
MCV: 85.2 fL (ref 80.0–100.0)
Platelets: 176 10*3/uL (ref 150–400)
RBC: 2.9 MIL/uL — ABNORMAL LOW (ref 4.22–5.81)
RDW: 15.7 % — ABNORMAL HIGH (ref 11.5–15.5)
WBC: 9.2 10*3/uL (ref 4.0–10.5)
nRBC: 0 % (ref 0.0–0.2)

## 2019-03-23 LAB — RENAL FUNCTION PANEL
Albumin: 1.9 g/dL — ABNORMAL LOW (ref 3.5–5.0)
Anion gap: 16 — ABNORMAL HIGH (ref 5–15)
BUN: 84 mg/dL — ABNORMAL HIGH (ref 8–23)
CO2: 20 mmol/L — ABNORMAL LOW (ref 22–32)
Calcium: 8.8 mg/dL — ABNORMAL LOW (ref 8.9–10.3)
Chloride: 95 mmol/L — ABNORMAL LOW (ref 98–111)
Creatinine, Ser: 3.24 mg/dL — ABNORMAL HIGH (ref 0.61–1.24)
GFR calc Af Amer: 21 mL/min — ABNORMAL LOW (ref >60)
GFR calc non Af Amer: 18 mL/min — ABNORMAL LOW (ref >60)
Glucose, Bld: 157 mg/dL — ABNORMAL HIGH (ref 70–99)
Phosphorus: 2.8 mg/dL (ref 2.5–4.6)
Potassium: 4.7 mmol/L (ref 3.5–5.1)
Sodium: 131 mmol/L — ABNORMAL LOW (ref 135–145)

## 2019-03-23 NOTE — Progress Notes (Signed)
Pulmonary Critical Care Medicine [place]   PULMONARY CRITICAL CARE SERVICE  PROGRESS NOTE  Date of Service: 03/23/2019  Paul Ball  [name]  DOB: [birth year]   DOA: 02/15/2019  Referring Physician: [name], MD  HPI: Paul Ball is a 73 y.o. male seen for follow up of Acute on Chronic Respiratory Failure.  Patient currently is on full support on assist control mode has been on 30% FiO2 not tolerating any weaning attempts thus far  Medications: Reviewed on Rounds  Physical Exam:  Vitals: Temperature 97.7 pulse 106 respiratory rate 20 blood pressure 141/65 saturations 97%  Ventilator Settings mode ventilation assist control FiO2 30% tidal volume 300 PEEP 5  . General: Comfortable at this time . Eyes: Grossly normal lids, irises & conjunctiva . ENT: grossly tongue is normal . Neck: no obvious mass . Cardiovascular: S1 S2 normal no gallop . Respiratory: No rhonchi no rales are noted at this time . Abdomen: soft . Skin: no rash seen on limited exam . Musculoskeletal: not rigid . Psychiatric:unable to assess . Neurologic: no seizure no involuntary movements         Lab Data:   Basic Metabolic Panel: Recent Labs  Lab 03/18/19 0550 03/21/19 0639 03/23/19 0610  NA 131* 131* 131*  K 3.9 4.1 4.7  CL 94* 95* 95*  CO2 25 25 20*  GLUCOSE 172* 166* 157*  BUN 63* 88* 84*  CREATININE 3.05* 3.46* 3.24*  CALCIUM 8.4* 9.1 8.8*  PHOS 2.2* 3.2 2.8    ABG: No results for input(s): PHART, PCO2ART, PO2ART, HCO3, O2SAT in the last 168 hours.  Liver Function Tests: Recent Labs  Lab 03/18/19 0550 03/21/19 0639 03/23/19 0610  ALBUMIN 1.7* 1.7* 1.9*   No results for input(s): LIPASE, AMYLASE in the last 168 hours. No results for input(s): AMMONIA in the last 168 hours.  CBC: Recent Labs  Lab 03/18/19 0550 03/21/19 0639 03/23/19 0610  WBC 8.0 9.8 9.2  HGB 9.1* 9.1* 8.3*  HCT 27.3* 28.0* 24.7*  MCV 85.3 86.4 85.2  PLT 204 225 176     Cardiac Enzymes: No results for input(s): CKTOTAL, CKMB, CKMBINDEX, TROPONINI in the last 168 hours.  BNP (last 3 results) No results for input(s): BNP in the last 8760 hours.  ProBNP (last 3 results) No results for input(s): PROBNP in the last 8760 hours.  Radiological Exams: No results found.  Assessment/Plan Active Problems:   Acute on chronic respiratory failure with hypoxia (HCC)   Acute on chronic systolic and diastolic heart failure, NYHA class 3 (HCC)   Acute renal failure superimposed on stage 3 chronic kidney disease (HCC)   Healthcare-associated pneumonia   COPD, severe ([place])   1. Acute on chronic respiratory failure hypoxia patient continues on full support has not been tolerating any weaning attempts. 2. Acute on chronic systolic heart failure still significant fluid overload being managed by nephrology. 3. Healthcare associated pneumonia treated we will continue present management 4. Acute renal failure followed by nephrology for dialysis 5. Severe COPD at baseline continue present management   I have personally seen and evaluated the patient, evaluated laboratory and imaging results, formulated the assessment and plan and placed orders. The Patient requires high complexity decision making for assessment and support.  Case was discussed on Rounds with the Respiratory Therapy Staff  Allyne Gee, MD [facility] Pulmonary Critical Care Medicine Sleep Medicine

## 2019-03-23 NOTE — Progress Notes (Signed)
[facility]  ROUNDING NOTE   Subjective:  Patient remains vent dependent at this time. Also remains dialysis dependent. He will be receiving dialysis treatment today.    Objective:  Vital signs in last 24 hours:  97.7 pulse 106 respirations 20 blood pressure 141/65  Physical Exam: General: Critically ill-appearing  Head: Normocephalic, atraumatic. Moist oral mucosal membranes  Eyes: Anicteric  Neck: Tracheostomy in place  Lungs:  Scattered rhonchi, vent assisted  Heart: S1S2 no rubs  Abdomen:  Soft, nontender, bowel sounds present, PEG in place  Extremities: 2+ peripheral edema.  Neurologic: Not following commands  Skin: No rash  Access: Right internal jugular permcath    Basic Metabolic Panel: Recent Labs  Lab 03/18/19 0550 03/21/19 0639  NA 131* 131*  K 3.9 4.1  CL 94* 95*  CO2 25 25  GLUCOSE 172* 166*  BUN 63* 88*  CREATININE 3.05* 3.46*  CALCIUM 8.4* 9.1  PHOS 2.2* 3.2    Liver Function Tests: Recent Labs  Lab 03/18/19 0550 03/21/19 0639  ALBUMIN 1.7* 1.7*   No results for input(s): LIPASE, AMYLASE in the last 168 hours. No results for input(s): AMMONIA in the last 168 hours.  CBC: Recent Labs  Lab 03/18/19 0550 03/21/19 0639 03/23/19 0610  WBC 8.0 9.8 9.2  HGB 9.1* 9.1* 8.3*  HCT 27.3* 28.0* 24.7*  MCV 85.3 86.4 85.2  PLT 204 225 176    Cardiac Enzymes: No results for input(s): CKTOTAL, CKMB, CKMBINDEX, TROPONINI in the last 168 hours.  BNP: Invalid input(s): POCBNP  CBG: No results for input(s): GLUCAP in the last 168 hours.  Microbiology: Results for orders placed or performed during the hospital encounter of 02/15/19  Culture, Urine     Status: None   Collection Time: 02/15/19 12:52 AM   Specimen: Urine, Random  Result Value Ref Range Status   Specimen Description URINE, RANDOM  Final   Special Requests NONE  Final   Culture   Final    NO GROWTH Performed at [facility], [place] [address],  [place], [place] [address]    Report Status 02/17/2019 FINAL  Final  Culture, respiratory (non-expectorated)     Status: None   Collection Time: 02/15/19 10:50 PM   Specimen: Tracheal Aspirate; Respiratory  Result Value Ref Range Status   Specimen Description TRACHEAL ASPIRATE  Final   Special Requests NONE  Final   Gram Stain   Final    NO WBC SEEN RARE GRAM POSITIVE COCCI IN PAIRS Performed at [place] [facility], [address] [place],  [address]    Culture MODERATE ENTEROCOCCUS FAECALIS  Final   Report Status 02/19/2019 FINAL  Final   Organism ID, Bacteria ENTEROCOCCUS FAECALIS  Final      Susceptibility   Enterococcus faecalis - MIC*    AMPICILLIN <=2 SENSITIVE Sensitive     VANCOMYCIN 1 SENSITIVE Sensitive     GENTAMICIN SYNERGY RESISTANT Resistant     * MODERATE ENTEROCOCCUS FAECALIS  SARS Coronavirus 2 by RT PCR (hospital order, performed in [place] hospital lab) Nasopharyngeal Nasopharyngeal Swab     Status: None   Collection Time: 02/16/19  2:20 PM   Specimen: Nasopharyngeal Swab  Result Value Ref Range Status   SARS Coronavirus 2 NEGATIVE NEGATIVE Final    Comment: (NOTE) If result is NEGATIVE SARS-CoV-2 target nucleic acids are NOT DETECTED. The SARS-CoV-2 RNA is generally detectable in upper and lower  respiratory specimens during the acute phase of infection. The lowest  concentration of SARS-CoV-2 viral copies this assay can detect is 250  copies / mL. A negative result does not preclude SARS-CoV-2 infection  and should not be used as the sole basis for treatment or other  patient management decisions.  A negative result may occur with  improper specimen collection / handling, submission of specimen other  than nasopharyngeal swab, presence of viral mutation(s) within the  areas targeted by this assay, and inadequate number of viral copies  (<250 copies / mL). A negative result must be combined with clinical  observations, patient history, and  epidemiological information. If result is POSITIVE SARS-CoV-2 target nucleic acids are DETECTED. The SARS-CoV-2 RNA is generally detectable in upper and lower  respiratory specimens dur ing the acute phase of infection.  Positive  results are indicative of active infection with SARS-CoV-2.  Clinical  correlation with patient history and other diagnostic information is  necessary to determine patient infection status.  Positive results do  not rule out bacterial infection or co-infection with other viruses. If result is PRESUMPTIVE POSTIVE SARS-CoV-2 nucleic acids MAY BE PRESENT.   A presumptive positive result was obtained on the submitted specimen  and confirmed on repeat testing.  While 2019 novel coronavirus  (SARS-CoV-2) nucleic acids may be present in the submitted sample  additional confirmatory testing may be necessary for epidemiological  and / or clinical management purposes  to differentiate between  SARS-CoV-2 and other Sarbecovirus currently known to infect humans.  If clinically indicated additional testing with an alternate test  methodology [id number]) is advised. The SARS-CoV-2 RNA is generally  detectable in upper and lower respiratory sp ecimens during the acute  phase of infection. The expected result is Negative. Fact Sheet for Patients:  StrictlyIdeas.no Fact Sheet for Healthcare Providers: BankingDealers.co.za This test is not yet approved or cleared by the [place] FDA and has been authorized for detection and/or diagnosis of SARS-CoV-2 by FDA under an Emergency Use Authorization (EUA).  This EUA will remain in effect (meaning this test can be used) for the duration of the COVID-19 declaration under Section 564(b)(1) of the Act, 21 U.S.C. section 360bbb-3(b)(1), unless the authorization is terminated or revoked sooner. Performed at [facility], [place] [address] [place], [place] [address]   SARS  CORONAVIRUS 2 (TAT 6-24 HRS) Nasopharyngeal Nasopharyngeal Swab     Status: None   Collection Time: 03/07/19  7:40 AM   Specimen: Nasopharyngeal Swab  Result Value Ref Range Status   SARS Coronavirus 2 NEGATIVE NEGATIVE Final    Comment: (NOTE) SARS-CoV-2 target nucleic acids are NOT DETECTED. The SARS-CoV-2 RNA is generally detectable in upper and lower respiratory specimens during the acute phase of infection. Negative results do not preclude SARS-CoV-2 infection, do not rule out co-infections with other pathogens, and should not be used as the sole basis for treatment or other patient management decisions. Negative results must be combined with clinical observations, patient history, and epidemiological information. The expected result is Negative. Fact Sheet for Patients: SugarRoll.be Fact Sheet for Healthcare Providers: https://www.woods-mathews.com/ This test is not yet approved or cleared by the [place] FDA and  has been authorized for detection and/or diagnosis of SARS-CoV-2 by FDA under an Emergency Use Authorization (EUA). This EUA will remain  in effect (meaning this test can be used) for the duration of the COVID-19 declaration under Section 56 4(b)(1) of the Act, 21 U.S.C. section 360bbb-3(b)(1), unless the authorization is terminated or revoked sooner. Performed at [facility], [place]  [address], [place], [place] [address]   Stat Gram stain     Status: None   Collection Time: 03/11/19 12:46 PM   Specimen: Pleura; Body Fluid  Result Value Ref Range Status   Specimen Description PLEURAL LEFT  Final   Special Requests Normal  Final   Gram Stain   Final    NO WBC SEEN NO ORGANISMS SEEN Performed at [facility], [address], [place], [place] [address]    Report Status 03/11/2019 FINAL  Final  Gram stain     Status: None   Collection Time: 03/12/19 10:37 AM   Specimen: PATH Cytology Pleural fluid  Result  Value Ref Range Status   Specimen Description PLEURAL RIGHT  Final   Special Requests NONE  Final   Gram Stain   Final    RARE WBC PRESENT, PREDOMINANTLY MONONUCLEAR NO ORGANISMS SEEN Performed at [facility], [place] [address] [place], [place] [address]    Report Status 03/12/2019 FINAL  Final  Culture, body fluid-bottle     Status: None   Collection Time: 03/12/19 10:37 AM   Specimen: Pleura  Result Value Ref Range Status   Specimen Description PLEURAL RIGHT  Final   Special Requests NONE  Final   Culture   Final    NO GROWTH 5 DAYS Performed at [facility] [address] [place],  [address]    Report Status 03/17/2019 FINAL  Final    Coagulation Studies: No results for input(s): LABPROT, INR in the last 72 hours.  Urinalysis: No results for input(s): COLORURINE, LABSPEC, PHURINE, GLUCOSEU, HGBUR, BILIRUBINUR, KETONESUR, PROTEINUR, UROBILINOGEN, NITRITE, LEUKOCYTESUR in the last 72 hours.  Invalid input(s): APPERANCEUR    Imaging: No results found.   Medications:     iohexol  Assessment/ Plan:  73 y.o. male with a PMHx of BPH, allergic rhinitis, COPD, diabetes mellitus type 2, hypertension, GERD, acute respiratory failure, congestive heart failure, chronic kidney disease stage IIIb EGFR 43 who was admitted to [facility] on 02/15/2019 for ongoing management of acute respiratory failure.   1.  Acute kidney injury/chronic kidney disease stage IIIb baseline GFR 43.    Patient due for hemodialysis today.  Orders have been prepared.  2.  Acute respiratory failure.  FiO2 currently down to 30% with a PEEP of 5.  Weaning protocol as per pulmonary/critical care.  3.  Anemia of chronic kidney disease.  Hemoglobin 8.3 at last check.  Continue to monitor CBC closely.  4.  Secondary hyperparathyroidism.  Repeat serum phosphorus today.  LOS: 0 Paul Ball 11/18/20209:46 AM

## 2019-03-24 DIAGNOSIS — J9621 Acute and chronic respiratory failure with hypoxia: Secondary | ICD-10-CM | POA: Diagnosis not present

## 2019-03-24 DIAGNOSIS — N179 Acute kidney failure, unspecified: Secondary | ICD-10-CM | POA: Diagnosis not present

## 2019-03-24 DIAGNOSIS — J449 Chronic obstructive pulmonary disease, unspecified: Secondary | ICD-10-CM | POA: Diagnosis not present

## 2019-03-24 DIAGNOSIS — I5043 Acute on chronic combined systolic (congestive) and diastolic (congestive) heart failure: Secondary | ICD-10-CM | POA: Diagnosis not present

## 2019-03-24 NOTE — Progress Notes (Signed)
Pulmonary Critical Care Medicine [place]   PULMONARY CRITICAL CARE SERVICE  PROGRESS NOTE  Date of Service: 03/24/2019  Paul Ball  [id number]  DOB: [birth year]   DOA: 02/15/2019  Referring Physician: [name], MD  HPI: Paul Ball is a 73 y.o. male seen for follow up of Acute on Chronic Respiratory Failure.  Patient continues to not do well as far as being able to wean from the ventilator remains on the ventilator right now on full support  Medications: Reviewed on Rounds  Physical Exam:  Vitals: Temperature 98.0 pulse 71 respiratory 25 blood pressure 137/51 saturations 96%  Ventilator Settings mode ventilation assist control FiO2 30% tidal volume 570 PEEP 5  . General: Comfortable at this time . Eyes: Grossly normal lids, irises & conjunctiva . ENT: grossly tongue is normal . Neck: no obvious mass . Cardiovascular: S1 S2 normal no gallop . Respiratory: No rhonchi no rales are noted at this time . Abdomen: soft . Skin: no rash seen on limited exam . Musculoskeletal: not rigid . Psychiatric:unable to assess . Neurologic: no seizure no involuntary movements         Lab Data:   Basic Metabolic Panel: Recent Labs  Lab 03/18/19 0550 03/21/19 0639 03/23/19 0610  NA 131* 131* 131*  K 3.9 4.1 4.7  CL 94* 95* 95*  CO2 25 25 20*  GLUCOSE 172* 166* 157*  BUN 63* 88* 84*  CREATININE 3.05* 3.46* 3.24*  CALCIUM 8.4* 9.1 8.8*  PHOS 2.2* 3.2 2.8    ABG: No results for input(s): PHART, PCO2ART, PO2ART, HCO3, O2SAT in the last 168 hours.  Liver Function Tests: Recent Labs  Lab 03/18/19 0550 03/21/19 0639 03/23/19 0610  ALBUMIN 1.7* 1.7* 1.9*   No results for input(s): LIPASE, AMYLASE in the last 168 hours. No results for input(s): AMMONIA in the last 168 hours.  CBC: Recent Labs  Lab 03/18/19 0550 03/21/19 0639 03/23/19 0610  WBC 8.0 9.8 9.2  HGB 9.1* 9.1* 8.3*  HCT 27.3* 28.0* 24.7*  MCV 85.3 86.4 85.2  PLT 204 225 176     Cardiac Enzymes: No results for input(s): CKTOTAL, CKMB, CKMBINDEX, TROPONINI in the last 168 hours.  BNP (last 3 results) No results for input(s): BNP in the last 8760 hours.  ProBNP (last 3 results) No results for input(s): PROBNP in the last 8760 hours.  Radiological Exams: No results found.  Assessment/Plan Active Problems:   Acute on chronic respiratory failure with hypoxia (HCC)   Acute on chronic systolic and diastolic heart failure, NYHA class 3 (HCC)   Acute renal failure superimposed on stage 3 chronic kidney disease (HCC)   Healthcare-associated pneumonia   COPD, severe ([place])   1. Acute on chronic respiratory failure hypoxia continue with full support on the ventilator at this time patient has not been tolerating weaning assessments 2. Acute on chronic diastolic heart failure we will continue to monitor fluid status nephrology is following along 3. Acute renal failure nephrology recommendations 4. Healthcare associated pneumonia treated 5. COPD continue with supportive care prognosis guarded   I have personally seen and evaluated the patient, evaluated laboratory and imaging results, formulated the assessment and plan and placed orders. The Patient requires high complexity decision making for assessment and support.  Case was discussed on Rounds with the Respiratory Therapy Staff  Allyne Gee, MD [facility] Pulmonary Critical Care Medicine Sleep Medicine

## 2019-03-25 ENCOUNTER — Other Ambulatory Visit (HOSPITAL_COMMUNITY): Payer: Medicare Other

## 2019-03-25 DIAGNOSIS — I5043 Acute on chronic combined systolic (congestive) and diastolic (congestive) heart failure: Secondary | ICD-10-CM | POA: Diagnosis not present

## 2019-03-25 DIAGNOSIS — N179 Acute kidney failure, unspecified: Secondary | ICD-10-CM | POA: Diagnosis not present

## 2019-03-25 DIAGNOSIS — J449 Chronic obstructive pulmonary disease, unspecified: Secondary | ICD-10-CM | POA: Diagnosis not present

## 2019-03-25 DIAGNOSIS — J9621 Acute and chronic respiratory failure with hypoxia: Secondary | ICD-10-CM | POA: Diagnosis not present

## 2019-03-25 LAB — RENAL FUNCTION PANEL
Albumin: 1.7 g/dL — ABNORMAL LOW (ref 3.5–5.0)
Anion gap: 11 (ref 5–15)
BUN: 77 mg/dL — ABNORMAL HIGH (ref 8–23)
CO2: 26 mmol/L (ref 22–32)
Calcium: 8.8 mg/dL — ABNORMAL LOW (ref 8.9–10.3)
Chloride: 95 mmol/L — ABNORMAL LOW (ref 98–111)
Creatinine, Ser: 3.13 mg/dL — ABNORMAL HIGH (ref 0.61–1.24)
GFR calc Af Amer: 22 mL/min — ABNORMAL LOW
GFR calc non Af Amer: 19 mL/min — ABNORMAL LOW
Glucose, Bld: 182 mg/dL — ABNORMAL HIGH (ref 70–99)
Phosphorus: 2.8 mg/dL (ref 2.5–4.6)
Potassium: 3.9 mmol/L (ref 3.5–5.1)
Sodium: 132 mmol/L — ABNORMAL LOW (ref 135–145)

## 2019-03-25 LAB — CBC
HCT: 21.1 % — ABNORMAL LOW (ref 39.0–52.0)
Hemoglobin: 6.7 g/dL — CL (ref 13.0–17.0)
MCH: 28 pg (ref 26.0–34.0)
MCHC: 31.8 g/dL (ref 30.0–36.0)
MCV: 88.3 fL (ref 80.0–100.0)
Platelets: 184 K/uL (ref 150–400)
RBC: 2.39 MIL/uL — ABNORMAL LOW (ref 4.22–5.81)
RDW: 15.8 % — ABNORMAL HIGH (ref 11.5–15.5)
WBC: 9.3 K/uL (ref 4.0–10.5)
nRBC: 0 % (ref 0.0–0.2)

## 2019-03-25 NOTE — Progress Notes (Addendum)
Pulmonary Critical Care Medicine [place]   PULMONARY CRITICAL CARE SERVICE  PROGRESS NOTE  Date of Service: 03/25/2019  Paul Ball  [id number]  DOB: 1945/07/17   DOA: 02/15/2019  Referring Physician: [name], MD  HPI: Paul Ball is a 73 y.o. male seen for follow up of Acute on Chronic Respiratory Failure.  Patient continues to do well on full support on the ventilator at this time.  No fever or distress noted.  Medications: Reviewed on Rounds  Physical Exam:  Vitals: Pulse 89 respirations 26 BP 124/53 O2 sat 93% to 96.7  Ventilator Settings later mode assist control mode rate of 18 tidal volume 570 PEEP of 5 and FiO2 30%.  . General: Comfortable at this time . Eyes: Grossly normal lids, irises & conjunctiva . ENT: grossly tongue is normal . Neck: no obvious mass . Cardiovascular: S1 S2 normal no gallop . Respiratory: No rales or rhonchi noted . Abdomen: soft . Skin: no rash seen on limited exam . Musculoskeletal: not rigid . Psychiatric:unable to assess . Neurologic: no seizure no involuntary movements         Lab Data:   Basic Metabolic Panel: Recent Labs  Lab 03/21/19 0639 03/23/19 0610 03/25/19 0527  NA 131* 131* 132*  K 4.1 4.7 3.9  CL 95* 95* 95*  CO2 25 20* 26  GLUCOSE 166* 157* 182*  BUN 88* 84* 77*  CREATININE 3.46* 3.24* 3.13*  CALCIUM 9.1 8.8* 8.8*  PHOS 3.2 2.8 2.8    ABG: No results for input(s): PHART, PCO2ART, PO2ART, HCO3, O2SAT in the last 168 hours.  Liver Function Tests: Recent Labs  Lab 03/21/19 0639 03/23/19 0610 03/25/19 0527  ALBUMIN 1.7* 1.9* 1.7*   No results for input(s): LIPASE, AMYLASE in the last 168 hours. No results for input(s): AMMONIA in the last 168 hours.  CBC: Recent Labs  Lab 03/21/19 0639 03/23/19 0610 03/25/19 0527  WBC 9.8 9.2 9.3  HGB 9.1* 8.3* 6.7*  HCT 28.0* 24.7* 21.1*  MCV 86.4 85.2 88.3  PLT 225 176 184    Cardiac Enzymes: No results for input(s):  CKTOTAL, CKMB, CKMBINDEX, TROPONINI in the last 168 hours.  BNP (last 3 results) No results for input(s): BNP in the last 8760 hours.  ProBNP (last 3 results) No results for input(s): PROBNP in the last 8760 hours.  Radiological Exams: Dg Chest Port 1 View  Result Date: 03/25/2019 CLINICAL DATA:  Pneumonia and pleural effusions EXAM: PORTABLE CHEST 1 VIEW COMPARISON:  Four days ago.  CT 03/10/2019 FINDINGS: Tracheostomy tube in place. Dialysis catheter with tip at the upper right atrium. Unavoidable artifact from hardware. There is a binder in place per report. Interstitial airspace opacity on both sides with subpleural thickening along the left chest wall there is also a cardiophrenic sulcus effusion. The effusion is likely decreased. Borderline heart size. IMPRESSION: Borderline improvement in aeration. There is still layering pleural fluid and edema superimposed on emphysema. Patchy airspace opacity correlating with history of pneumonia. Electronically Signed   By: Monte Fantasia M.D.   On: 03/25/2019 07:32    Assessment/Plan Active Problems:   Acute on chronic respiratory failure with hypoxia (HCC)   Acute on chronic systolic and diastolic heart failure, NYHA class 3 (HCC)   Acute renal failure superimposed on stage 3 chronic kidney disease (HCC)   Healthcare-associated pneumonia   COPD, severe ([place])   1. Acute on chronic respiratory failure hypoxia continue attempt weaning of ventilator per protocol.  Patient has been  unsuccessful at this time we will continue to monitor. 2. Acute on chronic diastolic heart failure we will continue to monitor fluid status nephrology is following along 3. Acute renal failure nephrology recommendations 4. Healthcare associated pneumonia treated 5. COPD continue with supportive care prognosis guarded   I have personally seen and evaluated the patient, evaluated laboratory and imaging results, formulated the assessment and plan and placed  orders. The Patient requires high complexity decision making for assessment and support.  Case was discussed on Rounds with the Respiratory Therapy Staff  Allyne Gee, MD [facility] Pulmonary Critical Care Medicine Sleep Medicine

## 2019-03-25 NOTE — Progress Notes (Signed)
[facility]  ROUNDING NOTE   Subjective:  Patient remains critically ill. Still on the ventilator. Continues to have significant edema.    Objective:  Vital signs in last 24 hours:  Temperature 96.7 pulse 84 respirations 26 blood pressure 124/53  Physical Exam: General: Critically ill-appearing  Head: Normocephalic, atraumatic. Moist oral mucosal membranes  Eyes: Anicteric  Neck: Tracheostomy in place  Lungs:  Scattered rhonchi, vent assisted  Heart: S1S2 no rubs  Abdomen:  Soft, nontender, bowel sounds present, PEG in place  Extremities: 2+ peripheral edema.  Neurologic: Not following commands  Skin: No rash  Access: Right internal jugular permcath    Basic Metabolic Panel: Recent Labs  Lab 03/21/19 0639 03/23/19 0610 03/25/19 0527  NA 131* 131* 132*  K 4.1 4.7 3.9  CL 95* 95* 95*  CO2 25 20* 26  GLUCOSE 166* 157* 182*  BUN 88* 84* 77*  CREATININE 3.46* 3.24* 3.13*  CALCIUM 9.1 8.8* 8.8*  PHOS 3.2 2.8 2.8    Liver Function Tests: Recent Labs  Lab 03/21/19 0639 03/23/19 0610 03/25/19 0527  ALBUMIN 1.7* 1.9* 1.7*   No results for input(s): LIPASE, AMYLASE in the last 168 hours. No results for input(s): AMMONIA in the last 168 hours.  CBC: Recent Labs  Lab 03/21/19 0639 03/23/19 0610 03/25/19 0527  WBC 9.8 9.2 9.3  HGB 9.1* 8.3* 6.7*  HCT 28.0* 24.7* 21.1*  MCV 86.4 85.2 88.3  PLT 225 176 184    Cardiac Enzymes: No results for input(s): CKTOTAL, CKMB, CKMBINDEX, TROPONINI in the last 168 hours.  BNP: Invalid input(s): POCBNP  CBG: No results for input(s): GLUCAP in the last 168 hours.  Microbiology: Results for orders placed or performed during the hospital encounter of 02/15/19  Culture, Urine     Status: None   Collection Time: 02/15/19 12:52 AM   Specimen: Urine, Random  Result Value Ref Range Status   Specimen Description URINE, RANDOM  Final   Special Requests NONE  Final   Culture   Final    NO GROWTH Performed  at [facility], [place] [address] [place], [place] [address]    Report Status 02/17/2019 FINAL  Final  Culture, respiratory (non-expectorated)     Status: None   Collection Time: 02/15/19 10:50 PM   Specimen: Tracheal Aspirate; Respiratory  Result Value Ref Range Status   Specimen Description TRACHEAL ASPIRATE  Final   Special Requests NONE  Final   Gram Stain   Final    NO WBC SEEN RARE GRAM POSITIVE COCCI IN PAIRS Performed at [facility], [address], [place],  [address]    Culture MODERATE ENTEROCOCCUS FAECALIS  Final   Report Status 02/19/2019 FINAL  Final   Organism ID, Bacteria ENTEROCOCCUS FAECALIS  Final      Susceptibility   Enterococcus faecalis - MIC*    AMPICILLIN <=2 SENSITIVE Sensitive     VANCOMYCIN 1 SENSITIVE Sensitive     GENTAMICIN SYNERGY RESISTANT Resistant     * MODERATE ENTEROCOCCUS FAECALIS  SARS Coronavirus 2 by RT PCR (hospital order, performed in [facility] hospital lab) Nasopharyngeal Nasopharyngeal Swab     Status: None   Collection Time: 02/16/19  2:20 PM   Specimen: Nasopharyngeal Swab  Result Value Ref Range Status   SARS Coronavirus 2 NEGATIVE NEGATIVE Final    Comment: (NOTE) If result is NEGATIVE SARS-CoV-2 target nucleic acids are NOT DETECTED. The SARS-CoV-2 RNA is generally detectable in upper and lower  respiratory  specimens during the acute phase of infection. The lowest  concentration of SARS-CoV-2 viral copies this assay can detect is 250  copies / mL. A negative result does not preclude SARS-CoV-2 infection  and should not be used as the sole basis for treatment or other  patient management decisions.  A negative result may occur with  improper specimen collection / handling, submission of specimen other  than nasopharyngeal swab, presence of viral mutation(s) within the  areas targeted by this assay, and inadequate number of viral copies  (<250 copies / mL). A negative result must be combined with clinical   observations, patient history, and epidemiological information. If result is POSITIVE SARS-CoV-2 target nucleic acids are DETECTED. The SARS-CoV-2 RNA is generally detectable in upper and lower  respiratory specimens dur ing the acute phase of infection.  Positive  results are indicative of active infection with SARS-CoV-2.  Clinical  correlation with patient history and other diagnostic information is  necessary to determine patient infection status.  Positive results do  not rule out bacterial infection or co-infection with other viruses. If result is PRESUMPTIVE POSTIVE SARS-CoV-2 nucleic acids MAY BE PRESENT.   A presumptive positive result was obtained on the submitted specimen  and confirmed on repeat testing.  While 2019 novel coronavirus  (SARS-CoV-2) nucleic acids may be present in the submitted sample  additional confirmatory testing may be necessary for epidemiological  and / or clinical management purposes  to differentiate between  SARS-CoV-2 and other Sarbecovirus currently known to infect humans.  If clinically indicated additional testing with an alternate test  methodology [phone]) is advised. The SARS-CoV-2 RNA is generally  detectable in upper and lower respiratory sp ecimens during the acute  phase of infection. The expected result is Negative. Fact Sheet for Patients:  StrictlyIdeas.no Fact Sheet for Healthcare Providers: BankingDealers.co.za This test is not yet approved or cleared by the [place] FDA and has been authorized for detection and/or diagnosis of SARS-CoV-2 by FDA under an Emergency Use Authorization (EUA).  This EUA will remain in effect (meaning this test can be used) for the duration of the COVID-19 declaration under Section 564(b)(1) of the Act, 21 U.S.C. section 360bbb-3(b)(1), unless the authorization is terminated or revoked sooner. Performed at [facility], [place] [address],  [place], [place] [address]   SARS CORONAVIRUS 2 (TAT 6-24 HRS) Nasopharyngeal Nasopharyngeal Swab     Status: None   Collection Time: 03/07/19  7:40 AM   Specimen: Nasopharyngeal Swab  Result Value Ref Range Status   SARS Coronavirus 2 NEGATIVE NEGATIVE Final    Comment: (NOTE) SARS-CoV-2 target nucleic acids are NOT DETECTED. The SARS-CoV-2 RNA is generally detectable in upper and lower respiratory specimens during the acute phase of infection. Negative results do not preclude SARS-CoV-2 infection, do not rule out co-infections with other pathogens, and should not be used as the sole basis for treatment or other patient management decisions. Negative results must be combined with clinical observations, patient history, and epidemiological information. The expected result is Negative. Fact Sheet for Patients: SugarRoll.be Fact Sheet for Healthcare Providers: https://www.woods-mathews.com/ This test is not yet approved or cleared by the [place] FDA and  has been authorized for detection and/or diagnosis of SARS-CoV-2 by FDA under an Emergency Use Authorization (EUA). This EUA will remain  in effect (meaning this test can be used) for the duration of the COVID-19 declaration under Section 56 4(b)(1) of the Act, 21 U.S.C. section 360bbb-3(b)(1), unless the authorization is terminated or  revoked sooner. Performed at [facility], [place] [address] [place], [place] [address]   Stat Gram stain     Status: None   Collection Time: 03/11/19 12:46 PM   Specimen: Pleura; Body Fluid  Result Value Ref Range Status   Specimen Description PLEURAL LEFT  Final   Special Requests Normal  Final   Gram Stain   Final    NO WBC SEEN NO ORGANISMS SEEN Performed at [facility], [address] [place], [place] [address]    Report Status 03/11/2019 FINAL  Final  Gram stain     Status: None   Collection Time: 03/12/19 10:37 AM   Specimen: PATH  Cytology Pleural fluid  Result Value Ref Range Status   Specimen Description PLEURAL RIGHT  Final   Special Requests NONE  Final   Gram Stain   Final    RARE WBC PRESENT, PREDOMINANTLY MONONUCLEAR NO ORGANISMS SEEN Performed at [facility], [place] [address], [place], [place] [address]    Report Status 03/12/2019 FINAL  Final  Culture, body fluid-bottle     Status: None   Collection Time: 03/12/19 10:37 AM   Specimen: Pleura  Result Value Ref Range Status   Specimen Description PLEURAL RIGHT  Final   Special Requests NONE  Final   Culture   Final    NO GROWTH 5 DAYS Performed at [place] [address] [place], [place] [address]    Report Status 03/17/2019 FINAL  Final    Coagulation Studies: No results for input(s): LABPROT, INR in the last 72 hours.  Urinalysis: No results for input(s): COLORURINE, LABSPEC, PHURINE, GLUCOSEU, HGBUR, BILIRUBINUR, KETONESUR, PROTEINUR, UROBILINOGEN, NITRITE, LEUKOCYTESUR in the last 72 hours.  Invalid input(s): APPERANCEUR    Imaging: Dg Chest Port 1 View  Result Date: 03/25/2019 CLINICAL DATA:  Pneumonia and pleural effusions EXAM: PORTABLE CHEST 1 VIEW COMPARISON:  Four days ago.  CT 03/10/2019 FINDINGS: Tracheostomy tube in place. Dialysis catheter with tip at the upper right atrium. Unavoidable artifact from hardware. There is a binder in place per report. Interstitial airspace opacity on both sides with subpleural thickening along the left chest wall there is also a cardiophrenic sulcus effusion. The effusion is likely decreased. Borderline heart size. IMPRESSION: Borderline improvement in aeration. There is still layering pleural fluid and edema superimposed on emphysema. Patchy airspace opacity correlating with history of pneumonia. Electronically Signed   By: Monte Fantasia M.D.   On: 03/25/2019 07:32     Medications:     iohexol  Assessment/ Plan:  73 y.o. male with a PMHx of BPH, allergic rhinitis, COPD,  diabetes mellitus type 2, hypertension, GERD, acute respiratory failure, congestive heart failure, chronic kidney disease stage IIIb EGFR 43 who was admitted to [facility] on 02/15/2019 for ongoing management of acute respiratory failure.   1.  Acute kidney injury/chronic kidney disease stage IIIb baseline GFR 43.    Patient remains dialysis dependent at this time.  We will maintain the patient on MWF dialysis schedule.  Due for dialysis today.  2.  Acute respiratory failure.  Patient remains on the ventilator at this moment.  Maintain the patient on current ventilatory support.  3.  Anemia of chronic kidney disease.  Hemoglobin is dropped to 6.7.  Patient to be transfused 1 unit PRBC per hospitalist.  4.  Secondary hyperparathyroidism.  Phosphorus was 2.8 on today's blood draw.  Continue to monitor.  LOS: 0 Kareena Arrambide 11/20/20208:08 AM

## 2019-03-26 DIAGNOSIS — N179 Acute kidney failure, unspecified: Secondary | ICD-10-CM | POA: Diagnosis not present

## 2019-03-26 DIAGNOSIS — J9621 Acute and chronic respiratory failure with hypoxia: Secondary | ICD-10-CM | POA: Diagnosis not present

## 2019-03-26 DIAGNOSIS — I5043 Acute on chronic combined systolic (congestive) and diastolic (congestive) heart failure: Secondary | ICD-10-CM | POA: Diagnosis not present

## 2019-03-26 DIAGNOSIS — J449 Chronic obstructive pulmonary disease, unspecified: Secondary | ICD-10-CM | POA: Diagnosis not present

## 2019-03-26 LAB — CBC
HCT: 25.2 % — ABNORMAL LOW (ref 39.0–52.0)
Hemoglobin: 8.2 g/dL — ABNORMAL LOW (ref 13.0–17.0)
MCH: 28.5 pg (ref 26.0–34.0)
MCHC: 32.5 g/dL (ref 30.0–36.0)
MCV: 87.5 fL (ref 80.0–100.0)
Platelets: 185 10*3/uL (ref 150–400)
RBC: 2.88 MIL/uL — ABNORMAL LOW (ref 4.22–5.81)
RDW: 15.3 % (ref 11.5–15.5)
WBC: 7.9 10*3/uL (ref 4.0–10.5)
nRBC: 0 % (ref 0.0–0.2)

## 2019-03-26 NOTE — Progress Notes (Addendum)
Pulmonary Critical Care Medicine [place]   PULMONARY CRITICAL CARE SERVICE  PROGRESS NOTE  Date of Service: 03/26/2019  Paul Ball  [id number]  DOB: Apr 09, 1946   DOA: 02/15/2019  Referring Physician: [name], MD  HPI: Paul Ball is a 73 y.o. male seen for follow up of Acute on Chronic Respiratory Failure.  Patient is on full support ventilator at this time satting well  Medications: Reviewed on Rounds  Physical Exam:  Vitals: Pulse 90 respirations 26 BP 126/62 O2 sat 97% temp 98.8  Ventilator Settings were mode AC VC plus rate of 16 tidal volume 420 PEEP 5 FiO2 30%  . General: Comfortable at this time . Eyes: Grossly normal lids, irises & conjunctiva . ENT: grossly tongue is normal . Neck: no obvious mass . Cardiovascular: S1 S2 normal no gallop . Respiratory: No rales rhonchi noted . Abdomen: soft . Skin: no rash seen on limited exam . Musculoskeletal: not rigid . Psychiatric:unable to assess . Neurologic: no seizure no involuntary movements         Lab Data:   Basic Metabolic Panel: Recent Labs  Lab 03/21/19 0639 03/23/19 0610 03/25/19 0527  NA 131* 131* 132*  K 4.1 4.7 3.9  CL 95* 95* 95*  CO2 25 20* 26  GLUCOSE 166* 157* 182*  BUN 88* 84* 77*  CREATININE 3.46* 3.24* 3.13*  CALCIUM 9.1 8.8* 8.8*  PHOS 3.2 2.8 2.8    ABG: No results for input(s): PHART, PCO2ART, PO2ART, HCO3, O2SAT in the last 168 hours.  Liver Function Tests: Recent Labs  Lab 03/21/19 0639 03/23/19 0610 03/25/19 0527  ALBUMIN 1.7* 1.9* 1.7*   No results for input(s): LIPASE, AMYLASE in the last 168 hours. No results for input(s): AMMONIA in the last 168 hours.  CBC: Recent Labs  Lab 03/21/19 0639 03/23/19 0610 03/25/19 0527 03/26/19 1122  WBC 9.8 9.2 9.3 7.9  HGB 9.1* 8.3* 6.7* 8.2*  HCT 28.0* 24.7* 21.1* 25.2*  MCV 86.4 85.2 88.3 87.5  PLT 225 176 184 185    Cardiac Enzymes: No results for input(s): CKTOTAL, CKMB, CKMBINDEX,  TROPONINI in the last 168 hours.  BNP (last 3 results) No results for input(s): BNP in the last 8760 hours.  ProBNP (last 3 results) No results for input(s): PROBNP in the last 8760 hours.  Radiological Exams: Dg Chest Port 1 View  Result Date: 03/25/2019 CLINICAL DATA:  Pneumonia and pleural effusions EXAM: PORTABLE CHEST 1 VIEW COMPARISON:  Four days ago.  CT 03/10/2019 FINDINGS: Tracheostomy tube in place. Dialysis catheter with tip at the upper right atrium. Unavoidable artifact from hardware. There is a binder in place per report. Interstitial airspace opacity on both sides with subpleural thickening along the left chest wall there is also a cardiophrenic sulcus effusion. The effusion is likely decreased. Borderline heart size. IMPRESSION: Borderline improvement in aeration. There is still layering pleural fluid and edema superimposed on emphysema. Patchy airspace opacity correlating with history of pneumonia. Electronically Signed   By: Monte Fantasia M.D.   On: 03/25/2019 07:32    Assessment/Plan Active Problems:   Acute on chronic respiratory failure with hypoxia (HCC)   Acute on chronic systolic and diastolic heart failure, NYHA class 3 (HCC)   Acute renal failure superimposed on stage 3 chronic kidney disease (HCC)   Healthcare-associated pneumonia   COPD, severe ([facility])   1. Acute on chronic respiratory failure hypoxia continue attempt weaning of ventilator per protocol.  Patient has been unsuccessful at this time we  will continue to monitor. 2. Acute on chronic diastolic heart failure we will continue to monitor fluid status nephrology is following along 3. Acute renal failure nephrology recommendations 4. Healthcare associated pneumonia treated 5. COPD continue with supportive care prognosis guarded   I have personally seen and evaluated the patient, evaluated laboratory and imaging results, formulated the assessment and plan and placed orders. The Patient requires high  complexity decision making for assessment and support.  Case was discussed on Rounds with the Respiratory Therapy Staff  Allyne Gee, MD [facility] Pulmonary Critical Care Medicine Sleep Medicine

## 2019-03-27 DIAGNOSIS — I5043 Acute on chronic combined systolic (congestive) and diastolic (congestive) heart failure: Secondary | ICD-10-CM | POA: Diagnosis not present

## 2019-03-27 DIAGNOSIS — J9621 Acute and chronic respiratory failure with hypoxia: Secondary | ICD-10-CM | POA: Diagnosis not present

## 2019-03-27 DIAGNOSIS — J449 Chronic obstructive pulmonary disease, unspecified: Secondary | ICD-10-CM | POA: Diagnosis not present

## 2019-03-27 DIAGNOSIS — N179 Acute kidney failure, unspecified: Secondary | ICD-10-CM | POA: Diagnosis not present

## 2019-03-27 NOTE — Progress Notes (Signed)
Pulmonary Critical Care Medicine [place]   PULMONARY CRITICAL CARE SERVICE  PROGRESS NOTE  Date of Service: 03/27/2019  Paul Ball  [id number]  DOB: 12-31-1945   DOA: 02/15/2019  Referring Physician: [name], MD  HPI: Paul Ball is a 73 y.o. male seen for follow up of Acute on Chronic Respiratory Failure.  Patient currently is on full support on assist control mode has been on 30% FiO2 has not been able to tolerate weaning  Medications: Reviewed on Rounds  Physical Exam:  Vitals: Temperature 94.1 pulse 91 respiratory rate 18 blood pressure 157/70 saturations 99%  Ventilator Settings mode of ventilation assist control FiO2 30% tidal line 420 PEEP 5  . General: Comfortable at this time . Eyes: Grossly normal lids, irises & conjunctiva . ENT: grossly tongue is normal . Neck: no obvious mass . Cardiovascular: S1 S2 normal no gallop . Respiratory: No rhonchi no rales are noted at this time . Abdomen: soft . Skin: no rash seen on limited exam . Musculoskeletal: not rigid . Psychiatric:unable to assess . Neurologic: no seizure no involuntary movements         Lab Data:   Basic Metabolic Panel: Recent Labs  Lab 03/21/19 0639 03/23/19 0610 03/25/19 0527  NA 131* 131* 132*  K 4.1 4.7 3.9  CL 95* 95* 95*  CO2 25 20* 26  GLUCOSE 166* 157* 182*  BUN 88* 84* 77*  CREATININE 3.46* 3.24* 3.13*  CALCIUM 9.1 8.8* 8.8*  PHOS 3.2 2.8 2.8    ABG: No results for input(s): PHART, PCO2ART, PO2ART, HCO3, O2SAT in the last 168 hours.  Liver Function Tests: Recent Labs  Lab 03/21/19 0639 03/23/19 0610 03/25/19 0527  ALBUMIN 1.7* 1.9* 1.7*   No results for input(s): LIPASE, AMYLASE in the last 168 hours. No results for input(s): AMMONIA in the last 168 hours.  CBC: Recent Labs  Lab 03/21/19 0639 03/23/19 0610 03/25/19 0527 03/26/19 1122  WBC 9.8 9.2 9.3 7.9  HGB 9.1* 8.3* 6.7* 8.2*  HCT 28.0* 24.7* 21.1* 25.2*  MCV 86.4 85.2 88.3  87.5  PLT 225 176 184 185    Cardiac Enzymes: No results for input(s): CKTOTAL, CKMB, CKMBINDEX, TROPONINI in the last 168 hours.  BNP (last 3 results) No results for input(s): BNP in the last 8760 hours.  ProBNP (last 3 results) No results for input(s): PROBNP in the last 8760 hours.  Radiological Exams: No results found.  Assessment/Plan Active Problems:   Acute on chronic respiratory failure with hypoxia (HCC)   Acute on chronic systolic and diastolic heart failure, NYHA class 3 (HCC)   Acute renal failure superimposed on stage 3 chronic kidney disease (HCC)   Healthcare-associated pneumonia   COPD, severe ([place])   1. Acute on chronic respiratory failure hypoxia we will continue with full support on assist control currently is on 30% FiO2 with a PEEP of 5 we will continue with supportive care and reassess weaning potential 2. Acute on chronic systolic heart failure followed by nephrology for fluid status for dialysis 3. Acute renal failure hemodialysis 4. Healthcare associated pneumonia treated 5. Severe COPD at baseline   I have personally seen and evaluated the patient, evaluated laboratory and imaging results, formulated the assessment and plan and placed orders. The Patient requires high complexity decision making for assessment and support.  Case was discussed on Rounds with the Respiratory Therapy Staff  Allyne Gee, MD [facility] Pulmonary Critical Care Medicine Sleep Medicine

## 2019-03-28 DIAGNOSIS — J449 Chronic obstructive pulmonary disease, unspecified: Secondary | ICD-10-CM | POA: Diagnosis not present

## 2019-03-28 DIAGNOSIS — J9621 Acute and chronic respiratory failure with hypoxia: Secondary | ICD-10-CM | POA: Diagnosis not present

## 2019-03-28 DIAGNOSIS — I5043 Acute on chronic combined systolic (congestive) and diastolic (congestive) heart failure: Secondary | ICD-10-CM | POA: Diagnosis not present

## 2019-03-28 DIAGNOSIS — N179 Acute kidney failure, unspecified: Secondary | ICD-10-CM | POA: Diagnosis not present

## 2019-03-28 LAB — RENAL FUNCTION PANEL
Albumin: 1.7 g/dL — ABNORMAL LOW (ref 3.5–5.0)
Anion gap: 11 (ref 5–15)
BUN: 88 mg/dL — ABNORMAL HIGH (ref 8–23)
CO2: 27 mmol/L (ref 22–32)
Calcium: 8.9 mg/dL (ref 8.9–10.3)
Chloride: 94 mmol/L — ABNORMAL LOW (ref 98–111)
Creatinine, Ser: 3.41 mg/dL — ABNORMAL HIGH (ref 0.61–1.24)
GFR calc Af Amer: 20 mL/min — ABNORMAL LOW (ref >60)
GFR calc non Af Amer: 17 mL/min — ABNORMAL LOW (ref >60)
Glucose, Bld: 193 mg/dL — ABNORMAL HIGH (ref 70–99)
Phosphorus: 3.3 mg/dL (ref 2.5–4.6)
Potassium: 4.1 mmol/L (ref 3.5–5.1)
Sodium: 132 mmol/L — ABNORMAL LOW (ref 135–145)

## 2019-03-28 LAB — CBC
HCT: 24.9 % — ABNORMAL LOW (ref 39.0–52.0)
Hemoglobin: 8.2 g/dL — ABNORMAL LOW (ref 13.0–17.0)
MCH: 28.6 pg (ref 26.0–34.0)
MCHC: 32.9 g/dL (ref 30.0–36.0)
MCV: 86.8 fL (ref 80.0–100.0)
Platelets: 189 10*3/uL (ref 150–400)
RBC: 2.87 MIL/uL — ABNORMAL LOW (ref 4.22–5.81)
RDW: 15.3 % (ref 11.5–15.5)
WBC: 9 10*3/uL (ref 4.0–10.5)
nRBC: 0 % (ref 0.0–0.2)

## 2019-03-28 NOTE — Progress Notes (Signed)
Pulmonary Critical Care Medicine [place]   PULMONARY CRITICAL CARE SERVICE  PROGRESS NOTE  Date of Service: 03/28/2019  Jourdan Cerullo  [id number]  DOB: 26-Sep-1945   DOA: 02/15/2019  Referring Physician: [name], MD  HPI: Paul Ball is a 73 y.o. male seen for follow up of Acute on Chronic Respiratory Failure.  Patient continues to not do well as far as weaning is concerned he remains on full support on assist control mode fail the RSB I again  Medications: Reviewed on Rounds  Physical Exam:  Vitals: Temperature 97.5 pulse 103 respiratory 28 blood pressure 160/65 saturations 95%  Ventilator Settings mode ventilation assist control FiO2 30% tidal volume 407 PEEP 5  . General: Comfortable at this time . Eyes: Grossly normal lids, irises & conjunctiva . ENT: grossly tongue is normal . Neck: no obvious mass . Cardiovascular: S1 S2 normal no gallop . Respiratory: No rhonchi no rales are noted at this time . Abdomen: soft . Skin: no rash seen on limited exam . Musculoskeletal: not rigid . Psychiatric:unable to assess . Neurologic: no seizure no involuntary movements         Lab Data:   Basic Metabolic Panel: Recent Labs  Lab 03/23/19 0610 03/25/19 0527 03/28/19 0631  NA 131* 132* 132*  K 4.7 3.9 4.1  CL 95* 95* 94*  CO2 20* 26 27  GLUCOSE 157* 182* 193*  BUN 84* 77* 88*  CREATININE 3.24* 3.13* 3.41*  CALCIUM 8.8* 8.8* 8.9  PHOS 2.8 2.8 3.3    ABG: No results for input(s): PHART, PCO2ART, PO2ART, HCO3, O2SAT in the last 168 hours.  Liver Function Tests: Recent Labs  Lab 03/23/19 0610 03/25/19 0527 03/28/19 0631  ALBUMIN 1.9* 1.7* 1.7*   No results for input(s): LIPASE, AMYLASE in the last 168 hours. No results for input(s): AMMONIA in the last 168 hours.  CBC: Recent Labs  Lab 03/23/19 0610 03/25/19 0527 03/26/19 1122 03/28/19 0631  WBC 9.2 9.3 7.9 9.0  HGB 8.3* 6.7* 8.2* 8.2*  HCT 24.7* 21.1* 25.2* 24.9*  MCV 85.2  88.3 87.5 86.8  PLT 176 184 185 189    Cardiac Enzymes: No results for input(s): CKTOTAL, CKMB, CKMBINDEX, TROPONINI in the last 168 hours.  BNP (last 3 results) No results for input(s): BNP in the last 8760 hours.  ProBNP (last 3 results) No results for input(s): PROBNP in the last 8760 hours.  Radiological Exams: No results found.  Assessment/Plan Active Problems:   Acute on chronic respiratory failure with hypoxia (HCC)   Acute on chronic systolic and diastolic heart failure, NYHA class 3 (HCC)   Acute renal failure superimposed on stage 3 chronic kidney disease (HCC)   Healthcare-associated pneumonia   COPD, severe ([place])   1. Acute on chronic respiratory failure with hypoxia we will continue with supportive care on the ventilator full support 2. Acute on chronic diastolic heart failure patient appears to be euvolemic but is not tolerating weans 3. Acute renal failure per nephrology 4. Healthcare associated pneumonia treated we will continue to monitor 5. Severe COPD continue with supportive care present management   I have personally seen and evaluated the patient, evaluated laboratory and imaging results, formulated the assessment and plan and placed orders. The Patient requires high complexity decision making for assessment and support.  Case was discussed on Rounds with the Respiratory Therapy Staff  Allyne Gee, MD [facility] Pulmonary Critical Care Medicine Sleep Medicine

## 2019-03-28 NOTE — Progress Notes (Signed)
[facility]  ROUNDING NOTE   Subjective:  Patient seen and evaluated at bedside. Still on the ventilator. FiO2 30%.   Objective:  Vital signs in last 24 hours:  Temperature 97.5 pulse 103 respirations 28 blood pressure 160/65  Physical Exam: General: Critically ill-appearing  Head: Normocephalic, atraumatic. Moist oral mucosal membranes  Eyes: Anicteric  Neck: Tracheostomy in place  Lungs:  Scattered rhonchi, vent assisted  Heart: S1S2 no rubs  Abdomen:  Soft, nontender, bowel sounds present, PEG in place  Extremities: 2+ peripheral edema.  Neurologic: Not following commands  Skin: No rash  Access: Right internal jugular permcath    Basic Metabolic Panel: Recent Labs  Lab 03/23/19 0610 03/25/19 0527 03/28/19 0631  NA 131* 132* 132*  K 4.7 3.9 4.1  CL 95* 95* 94*  CO2 20* 26 27  GLUCOSE 157* 182* 193*  BUN 84* 77* 88*  CREATININE 3.24* 3.13* 3.41*  CALCIUM 8.8* 8.8* 8.9  PHOS 2.8 2.8 3.3    Liver Function Tests: Recent Labs  Lab 03/23/19 0610 03/25/19 0527 03/28/19 0631  ALBUMIN 1.9* 1.7* 1.7*   No results for input(s): LIPASE, AMYLASE in the last 168 hours. No results for input(s): AMMONIA in the last 168 hours.  CBC: Recent Labs  Lab 03/23/19 0610 03/25/19 0527 03/26/19 1122 03/28/19 0631  WBC 9.2 9.3 7.9 9.0  HGB 8.3* 6.7* 8.2* 8.2*  HCT 24.7* 21.1* 25.2* 24.9*  MCV 85.2 88.3 87.5 86.8  PLT 176 184 185 189    Cardiac Enzymes: No results for input(s): CKTOTAL, CKMB, CKMBINDEX, TROPONINI in the last 168 hours.  BNP: Invalid input(s): POCBNP  CBG: No results for input(s): GLUCAP in the last 168 hours.  Microbiology: Results for orders placed or performed during the hospital encounter of 02/15/19  Culture, Urine     Status: None   Collection Time: 02/15/19 12:52 AM   Specimen: Urine, Random  Result Value Ref Range Status   Specimen Description URINE, RANDOM  Final   Special Requests NONE  Final   Culture   Final    NO  GROWTH Performed at [facility], [place] [address], [place], [place] [address]    Report Status 02/17/2019 FINAL  Final  Culture, respiratory (non-expectorated)     Status: None   Collection Time: 02/15/19 10:50 PM   Specimen: Tracheal Aspirate; Respiratory  Result Value Ref Range Status   Specimen Description TRACHEAL ASPIRATE  Final   Special Requests NONE  Final   Gram Stain   Final    NO WBC SEEN RARE GRAM POSITIVE COCCI IN PAIRS Performed at [facility], [address] [place], [place] [address]    Culture MODERATE ENTEROCOCCUS FAECALIS  Final   Report Status 02/19/2019 FINAL  Final   Organism ID, Bacteria ENTEROCOCCUS FAECALIS  Final      Susceptibility   Enterococcus faecalis - MIC*    AMPICILLIN <=2 SENSITIVE Sensitive     VANCOMYCIN 1 SENSITIVE Sensitive     GENTAMICIN SYNERGY RESISTANT Resistant     * MODERATE ENTEROCOCCUS FAECALIS  SARS Coronavirus 2 by RT PCR (hospital order, performed in [facility] hospital lab) Nasopharyngeal Nasopharyngeal Swab     Status: None   Collection Time: 02/16/19  2:20 PM   Specimen: Nasopharyngeal Swab  Result Value Ref Range Status   SARS Coronavirus 2 NEGATIVE NEGATIVE Final    Comment: (NOTE) If result is NEGATIVE SARS-CoV-2 target nucleic acids are NOT DETECTED. The SARS-CoV-2 RNA is generally detectable in  upper and lower  respiratory specimens during the acute phase of infection. The lowest  concentration of SARS-CoV-2 viral copies this assay can detect is 250  copies / mL. A negative result does not preclude SARS-CoV-2 infection  and should not be used as the sole basis for treatment or other  patient management decisions.  A negative result may occur with  improper specimen collection / handling, submission of specimen other  than nasopharyngeal swab, presence of viral mutation(s) within the  areas targeted by this assay, and inadequate number of viral copies  (<250 copies / mL). A negative result must be combined  with clinical  observations, patient history, and epidemiological information. If result is POSITIVE SARS-CoV-2 target nucleic acids are DETECTED. The SARS-CoV-2 RNA is generally detectable in upper and lower  respiratory specimens dur ing the acute phase of infection.  Positive  results are indicative of active infection with SARS-CoV-2.  Clinical  correlation with patient history and other diagnostic information is  necessary to determine patient infection status.  Positive results do  not rule out bacterial infection or co-infection with other viruses. If result is PRESUMPTIVE POSTIVE SARS-CoV-2 nucleic acids MAY BE PRESENT.   A presumptive positive result was obtained on the submitted specimen  and confirmed on repeat testing.  While 2019 novel coronavirus  (SARS-CoV-2) nucleic acids may be present in the submitted sample  additional confirmatory testing may be necessary for epidemiological  and / or clinical management purposes  to differentiate between  SARS-CoV-2 and other Sarbecovirus currently known to infect humans.  If clinically indicated additional testing with an alternate test  methodology [phone]) is advised. The SARS-CoV-2 RNA is generally  detectable in upper and lower respiratory sp ecimens during the acute  phase of infection. The expected result is Negative. Fact Sheet for Patients:  StrictlyIdeas.no Fact Sheet for Healthcare Providers: BankingDealers.co.za This test is not yet approved or cleared by the [place] FDA and has been authorized for detection and/or diagnosis of SARS-CoV-2 by FDA under an Emergency Use Authorization (EUA).  This EUA will remain in effect (meaning this test can be used) for the duration of the COVID-19 declaration under Section 564(b)(1) of the Act, 21 U.S.C. section 360bbb-3(b)(1), unless the authorization is terminated or revoked sooner. Performed at [facility], [place] [address], [place], [place] [address]   SARS CORONAVIRUS 2 (TAT 6-24 HRS) Nasopharyngeal Nasopharyngeal Swab     Status: None   Collection Time: 03/07/19  7:40 AM   Specimen: Nasopharyngeal Swab  Result Value Ref Range Status   SARS Coronavirus 2 NEGATIVE NEGATIVE Final    Comment: (NOTE) SARS-CoV-2 target nucleic acids are NOT DETECTED. The SARS-CoV-2 RNA is generally detectable in upper and lower respiratory specimens during the acute phase of infection. Negative results do not preclude SARS-CoV-2 infection, do not rule out co-infections with other pathogens, and should not be used as the sole basis for treatment or other patient management decisions. Negative results must be combined with clinical observations, patient history, and epidemiological information. The expected result is Negative. Fact Sheet for Patients: SugarRoll.be Fact Sheet for Healthcare Providers: https://www.woods-mathews.com/ This test is not yet approved or cleared by the [place] FDA and  has been authorized for detection and/or diagnosis of SARS-CoV-2 by FDA under an Emergency Use Authorization (EUA). This EUA will remain  in effect (meaning this test can be used) for the duration of the COVID-19 declaration under Section 56 4(b)(1) of the Act, 21 U.S.C. section 360bbb-3(b)(1), unless  the authorization is terminated or revoked sooner. Performed at [facility], [place] [address], [place], [place] [address]   Stat Gram stain     Status: None   Collection Time: 03/11/19 12:46 PM   Specimen: Pleura; Body Fluid  Result Value Ref Range Status   Specimen Description PLEURAL LEFT  Final   Special Requests Normal  Final   Gram Stain   Final    NO WBC SEEN NO ORGANISMS SEEN Performed at [facility], [address] [place], [place] [address]    Report Status 03/11/2019 FINAL  Final  Gram stain     Status: None   Collection Time: 03/12/19 10:37 AM    Specimen: PATH Cytology Pleural fluid  Result Value Ref Range Status   Specimen Description PLEURAL RIGHT  Final   Special Requests NONE  Final   Gram Stain   Final    RARE WBC PRESENT, PREDOMINANTLY MONONUCLEAR NO ORGANISMS SEEN Performed at [facility], [place] [address] [place], [place] [address]    Report Status 03/12/2019 FINAL  Final  Culture, body fluid-bottle     Status: None   Collection Time: 03/12/19 10:37 AM   Specimen: Pleura  Result Value Ref Range Status   Specimen Description PLEURAL RIGHT  Final   Special Requests NONE  Final   Culture   Final    NO GROWTH 5 DAYS Performed at [place] [facility] [address], [place], [place] [address]    Report Status 03/17/2019 FINAL  Final    Coagulation Studies: No results for input(s): LABPROT, INR in the last 72 hours.  Urinalysis: No results for input(s): COLORURINE, LABSPEC, PHURINE, GLUCOSEU, HGBUR, BILIRUBINUR, KETONESUR, PROTEINUR, UROBILINOGEN, NITRITE, LEUKOCYTESUR in the last 72 hours.  Invalid input(s): APPERANCEUR    Imaging: No results found.   Medications:     iohexol  Assessment/ Plan:  73 y.o. male with a PMHx of BPH, allergic rhinitis, COPD, diabetes mellitus type 2, hypertension, GERD, acute respiratory failure, congestive heart failure, chronic kidney disease stage IIIb EGFR 43 who was admitted to [facility] on 02/15/2019 for ongoing management of acute respiratory failure.   1.  Acute kidney injury/chronic kidney disease stage IIIb baseline GFR 43.    Patient due for hemodialysis today.  Orders have been prepared.  Maintain patient on MWF schedule.  2.  Acute respiratory failure.  Patient continues to require ventilatory support.  FiO2 currently 30% with a PEEP of 5.  3.  Anemia of chronic kidney disease.  Hemoglobin 8.2 posttransfusion.  Continue to monitor.  4.  Secondary hyperparathyroidism.  Phosphorus 3.3 and a target.  LOS: 0 Annelyse Rey 11/23/20209:08 AM

## 2019-03-29 DIAGNOSIS — J9621 Acute and chronic respiratory failure with hypoxia: Secondary | ICD-10-CM | POA: Diagnosis not present

## 2019-03-29 DIAGNOSIS — N179 Acute kidney failure, unspecified: Secondary | ICD-10-CM | POA: Diagnosis not present

## 2019-03-29 DIAGNOSIS — J449 Chronic obstructive pulmonary disease, unspecified: Secondary | ICD-10-CM | POA: Diagnosis not present

## 2019-03-29 DIAGNOSIS — I5043 Acute on chronic combined systolic (congestive) and diastolic (congestive) heart failure: Secondary | ICD-10-CM | POA: Diagnosis not present

## 2019-03-29 LAB — CBC
HCT: 24.4 % — ABNORMAL LOW (ref 39.0–52.0)
Hemoglobin: 8 g/dL — ABNORMAL LOW (ref 13.0–17.0)
MCH: 28.6 pg (ref 26.0–34.0)
MCHC: 32.8 g/dL (ref 30.0–36.0)
MCV: 87.1 fL (ref 80.0–100.0)
Platelets: 171 10*3/uL (ref 150–400)
RBC: 2.8 MIL/uL — ABNORMAL LOW (ref 4.22–5.81)
RDW: 15.4 % (ref 11.5–15.5)
WBC: 9.4 10*3/uL (ref 4.0–10.5)
nRBC: 0 % (ref 0.0–0.2)

## 2019-03-29 LAB — BPAM RBC
Blood Product Expiration Date: 202012172359
Blood Product Expiration Date: 202012172359
ISSUE DATE / TIME: 202011201705
Unit Type and Rh: 7300
Unit Type and Rh: 7300

## 2019-03-29 LAB — TYPE AND SCREEN
ABO/RH(D): B POS
Antibody Screen: NEGATIVE
Unit division: 0
Unit division: 0

## 2019-03-29 NOTE — Progress Notes (Signed)
Pulmonary Critical Care Medicine [place]   PULMONARY CRITICAL CARE SERVICE  PROGRESS NOTE  Date of Service: 03/29/2019  Paul Ball  [id number]  DOB: 03-21-1946   DOA: 02/15/2019  Referring Physician: [name], MD  HPI: Paul Ball is a 73 y.o. male seen for follow up of Acute on Chronic Respiratory Failure.  Patient currently is on full support on assist control mode has been on 30% FiO2 with a PEEP of 5  Medications: Reviewed on Rounds  Physical Exam:  Vitals: Temperature 97.4 pulse 95 respiratory rate 20 blood pressure 91/51 saturations 98%  Ventilator Settings mode ventilation assist control FiO2 is 30% tidal line 410 PEEP 5  . General: Comfortable at this time . Eyes: Grossly normal lids, irises & conjunctiva . ENT: grossly tongue is normal . Neck: no obvious mass . Cardiovascular: S1 S2 normal no gallop . Respiratory: Coarse breath sounds few scattered rhonchi . Abdomen: soft . Skin: no rash seen on limited exam . Musculoskeletal: not rigid . Psychiatric:unable to assess . Neurologic: no seizure no involuntary movements         Lab Data:   Basic Metabolic Panel: Recent Labs  Lab 03/23/19 0610 03/25/19 0527 03/28/19 0631  NA 131* 132* 132*  K 4.7 3.9 4.1  CL 95* 95* 94*  CO2 20* 26 27  GLUCOSE 157* 182* 193*  BUN 84* 77* 88*  CREATININE 3.24* 3.13* 3.41*  CALCIUM 8.8* 8.8* 8.9  PHOS 2.8 2.8 3.3    ABG: No results for input(s): PHART, PCO2ART, PO2ART, HCO3, O2SAT in the last 168 hours.  Liver Function Tests: Recent Labs  Lab 03/23/19 0610 03/25/19 0527 03/28/19 0631  ALBUMIN 1.9* 1.7* 1.7*   No results for input(s): LIPASE, AMYLASE in the last 168 hours. No results for input(s): AMMONIA in the last 168 hours.  CBC: Recent Labs  Lab 03/23/19 0610 03/25/19 0527 03/26/19 1122 03/28/19 0631 03/29/19 0545  WBC 9.2 9.3 7.9 9.0 9.4  HGB 8.3* 6.7* 8.2* 8.2* 8.0*  HCT 24.7* 21.1* 25.2* 24.9* 24.4*  MCV 85.2  88.3 87.5 86.8 87.1  PLT 176 184 185 189 171    Cardiac Enzymes: No results for input(s): CKTOTAL, CKMB, CKMBINDEX, TROPONINI in the last 168 hours.  BNP (last 3 results) No results for input(s): BNP in the last 8760 hours.  ProBNP (last 3 results) No results for input(s): PROBNP in the last 8760 hours.  Radiological Exams: No results found.  Assessment/Plan Active Problems:   Acute on chronic respiratory failure with hypoxia (HCC)   Acute on chronic systolic and diastolic heart failure, NYHA class 3 (HCC)   Acute renal failure superimposed on stage 3 chronic kidney disease (HCC)   Healthcare-associated pneumonia   COPD, severe ([place])   1. Acute on chronic respiratory failure hypoxia patient continues on full support on assist control mode has not been tolerating any weaning attempts we will continue to monitor closely 2. Acute on chronic diastolic heart failure supportive care and fluid management per nephrology 3. Acute renal failure on hemodialysis 4. Healthcare associated pneumonia treated we will continue to monitor 5. Severe COPD no changes no improvement   I have personally seen and evaluated the patient, evaluated laboratory and imaging results, formulated the assessment and plan and placed orders. The Patient requires high complexity decision making for assessment and support.  Case was discussed on Rounds with the Respiratory Therapy Staff  Allyne Gee, MD [facility] Pulmonary Critical Care Medicine Sleep Medicine

## 2019-03-30 DIAGNOSIS — J9621 Acute and chronic respiratory failure with hypoxia: Secondary | ICD-10-CM | POA: Diagnosis not present

## 2019-03-30 DIAGNOSIS — N179 Acute kidney failure, unspecified: Secondary | ICD-10-CM | POA: Diagnosis not present

## 2019-03-30 DIAGNOSIS — J449 Chronic obstructive pulmonary disease, unspecified: Secondary | ICD-10-CM | POA: Diagnosis not present

## 2019-03-30 DIAGNOSIS — I5043 Acute on chronic combined systolic (congestive) and diastolic (congestive) heart failure: Secondary | ICD-10-CM | POA: Diagnosis not present

## 2019-03-30 LAB — RENAL FUNCTION PANEL
Albumin: 1.7 g/dL — ABNORMAL LOW (ref 3.5–5.0)
Anion gap: 10 (ref 5–15)
BUN: 75 mg/dL — ABNORMAL HIGH (ref 8–23)
CO2: 26 mmol/L (ref 22–32)
Calcium: 8.6 mg/dL — ABNORMAL LOW (ref 8.9–10.3)
Chloride: 96 mmol/L — ABNORMAL LOW (ref 98–111)
Creatinine, Ser: 2.87 mg/dL — ABNORMAL HIGH (ref 0.61–1.24)
GFR calc Af Amer: 24 mL/min — ABNORMAL LOW (ref >60)
GFR calc non Af Amer: 21 mL/min — ABNORMAL LOW (ref >60)
Glucose, Bld: 154 mg/dL — ABNORMAL HIGH (ref 70–99)
Phosphorus: 2.7 mg/dL (ref 2.5–4.6)
Potassium: 3.8 mmol/L (ref 3.5–5.1)
Sodium: 132 mmol/L — ABNORMAL LOW (ref 135–145)

## 2019-03-30 LAB — CBC
HCT: 20.3 % — ABNORMAL LOW (ref 39.0–52.0)
Hemoglobin: 6.6 g/dL — CL (ref 13.0–17.0)
MCH: 28.8 pg (ref 26.0–34.0)
MCHC: 32.5 g/dL (ref 30.0–36.0)
MCV: 88.6 fL (ref 80.0–100.0)
Platelets: 172 10*3/uL (ref 150–400)
RBC: 2.29 MIL/uL — ABNORMAL LOW (ref 4.22–5.81)
RDW: 15.4 % (ref 11.5–15.5)
WBC: 6.9 10*3/uL (ref 4.0–10.5)
nRBC: 0 % (ref 0.0–0.2)

## 2019-03-30 LAB — PREPARE RBC (CROSSMATCH)

## 2019-03-30 NOTE — Progress Notes (Signed)
Pulmonary Critical Care Medicine [facility]   PULMONARY CRITICAL CARE SERVICE  PROGRESS NOTE  Date of Service: 03/30/2019  Paul Ball  [name]  DOB: March 17, 1946   DOA: 02/15/2019  Referring Physician: [name], MD  HPI: Paul Ball is a 73 y.o. male seen for follow up of Acute on Chronic Respiratory Failure.  Patient is on full support right now on assist control mode has been on 30% FiO2 with a PEEP of 5  Medications: Reviewed on Rounds  Physical Exam:  Vitals: Temperature 97.8 pulse 83 respiratory 24 blood pressure 111/48 saturations 96%  Ventilator Settings mode of ventilation assist control FiO2 30% tidal volume 420 PEEP 5  . General: Comfortable at this time . Eyes: Grossly normal lids, irises & conjunctiva . ENT: grossly tongue is normal . Neck: no obvious mass . Cardiovascular: S1 S2 normal no gallop . Respiratory: No rhonchi no rales are noted at this time . Abdomen: soft . Skin: no rash seen on limited exam . Musculoskeletal: not rigid . Psychiatric:unable to assess . Neurologic: no seizure no involuntary movements         Lab Data:   Basic Metabolic Panel: Recent Labs  Lab 03/25/19 0527 03/28/19 0631 03/30/19 0541  NA 132* 132* 132*  K 3.9 4.1 3.8  CL 95* 94* 96*  CO2 26 27 26   GLUCOSE 182* 193* 154*  BUN 77* 88* 75*  CREATININE 3.13* 3.41* 2.87*  CALCIUM 8.8* 8.9 8.6*  PHOS 2.8 3.3 2.7    ABG: No results for input(s): PHART, PCO2ART, PO2ART, HCO3, O2SAT in the last 168 hours.  Liver Function Tests: Recent Labs  Lab 03/25/19 0527 03/28/19 0631 03/30/19 0541  ALBUMIN 1.7* 1.7* 1.7*   No results for input(s): LIPASE, AMYLASE in the last 168 hours. No results for input(s): AMMONIA in the last 168 hours.  CBC: Recent Labs  Lab 03/25/19 0527 03/26/19 1122 03/28/19 0631 03/29/19 0545 03/30/19 0541  WBC 9.3 7.9 9.0 9.4 6.9  HGB 6.7* 8.2* 8.2* 8.0* 6.6*  HCT 21.1* 25.2* 24.9* 24.4* 20.3*  MCV 88.3 87.5  86.8 87.1 88.6  PLT 184 185 189 171 172    Cardiac Enzymes: No results for input(s): CKTOTAL, CKMB, CKMBINDEX, TROPONINI in the last 168 hours.  BNP (last 3 results) No results for input(s): BNP in the last 8760 hours.  ProBNP (last 3 results) No results for input(s): PROBNP in the last 8760 hours.  Radiological Exams: No results found.  Assessment/Plan Active Problems:   Acute on chronic respiratory failure with hypoxia (HCC)   Acute on chronic systolic and diastolic heart failure, NYHA class 3 (HCC)   Acute renal failure superimposed on stage 3 chronic kidney disease (HCC)   Healthcare-associated pneumonia   COPD, severe ([place])   1. Acute on chronic respiratory failure with hypoxia patient is on assist control FiO2 30% tidal line 420 PEEP 5 has been consistently failing weaning attempts 2. Chronic congestive heart failure we will continue with supportive care monitor fluid status followed by nephrology 3. Healthcare associated pneumonia treated clinically improved however still not tolerating weaning 4. Severe COPD nebulizers as necessary 5. Acute renal failure nephrology following for dialysis   I have personally seen and evaluated the patient, evaluated laboratory and imaging results, formulated the assessment and plan and placed orders. The Patient requires high complexity decision making for assessment and support.  Case was discussed on Rounds with the Respiratory Therapy Staff  Allyne Gee, MD [facility] Pulmonary Critical Care Medicine Sleep  Medicine

## 2019-03-30 NOTE — Progress Notes (Signed)
[facility]  ROUNDING NOTE   Subjective:  Overall patient remains critically ill. Due for hemodialysis today. Still on the ventilator and FiO2 currently 30%.   Objective:  Vital signs in last 24 hours:  Temperature 97.8 pulse 83 respirations 24 blood pressure 111/48 Physical Exam: General: Critically ill-appearing  Head: Normocephalic, atraumatic. Moist oral mucosal membranes  Eyes: Anicteric  Neck: Tracheostomy in place  Lungs:  Scattered rhonchi, vent assisted  Heart: S1S2 no rubs  Abdomen:  Soft, nontender, bowel sounds present, PEG in place  Extremities: 2+ peripheral edema.  Neurologic: Not following commands  Skin: No rash  Access: Right internal jugular permcath    Basic Metabolic Panel: Recent Labs  Lab 03/25/19 0527 03/28/19 0631 03/30/19 0541  NA 132* 132* 132*  K 3.9 4.1 3.8  CL 95* 94* 96*  CO2 _0 GLUCOSE 182* 193* 154*  BUN 77* 88* 75*  CREATININE 3.13* 3.41* 2.87*  CALCIUM 8.8* 8.9 8.6*  PHOS 2.8 3.3 2.7    Liver Function Tests: Recent Labs  Lab 03/25/19 0527 03/28/19 0631 03/30/19 0541  ALBUMIN 1.7* 1.7* 1.7*   No results for input(s): LIPASE, AMYLASE in the last 168 hours. No results for input(s): AMMONIA in the last 168 hours.  CBC: Recent Labs  Lab 03/25/19 0527 03/26/19 1122 03/28/19 0631 03/29/19 0545 03/30/19 0541  WBC 9.3 7.9 9.0 9.4 6.9  HGB 6.7* 8.2* 8.2* 8.0* 6.6*  HCT 21.1* 25.2* 24.9* 24.4* 20.3*  MCV 88.3 87.5 86.8 87.1 88.6  PLT 184 185 189 171 172    Cardiac Enzymes: No results for input(s): CKTOTAL, CKMB, CKMBINDEX, TROPONINI in the last 168 hours.  BNP: Invalid input(s): POCBNP  CBG: No results for input(s): GLUCAP in the last 168 hours.  Microbiology: Results for orders placed or performed during the hospital encounter of 02/15/19  Culture, Urine     Status: None   Collection Time: 02/15/19 12:52 AM   Specimen: Urine, Random  Result Value Ref Range Status   Specimen Description  URINE, RANDOM  Final   Special Requests NONE  Final   Culture   Final    NO GROWTH Performed at [facility], [place] [address], [place], [place] [address]    Report Status 02/17/2019 FINAL  Final  Culture, respiratory (non-expectorated)     Status: None   Collection Time: 02/15/19 10:50 PM   Specimen: Tracheal Aspirate; Respiratory  Result Value Ref Range Status   Specimen Description TRACHEAL ASPIRATE  Final   Special Requests NONE  Final   Gram Stain   Final    NO WBC SEEN RARE GRAM POSITIVE COCCI IN PAIRS Performed at [facility], [address], [place],  [address]    Culture MODERATE ENTEROCOCCUS FAECALIS  Final   Report Status 02/19/2019 FINAL  Final   Organism ID, Bacteria ENTEROCOCCUS FAECALIS  Final      Susceptibility   Enterococcus faecalis - MIC*    AMPICILLIN <=2 SENSITIVE Sensitive     VANCOMYCIN 1 SENSITIVE Sensitive     GENTAMICIN SYNERGY RESISTANT Resistant     * MODERATE ENTEROCOCCUS FAECALIS  SARS Coronavirus 2 by RT PCR (hospital order, performed in [facility] hospital lab) Nasopharyngeal Nasopharyngeal Swab     Status: None   Collection Time: 02/16/19  2:20 PM   Specimen: Nasopharyngeal Swab  Result Value Ref Range Status   SARS Coronavirus 2 NEGATIVE NEGATIVE Final    Comment: (NOTE) If result is NEGATIVE SARS-CoV-2 target nucleic  acids are NOT DETECTED. The SARS-CoV-2 RNA is generally detectable in upper and lower  respiratory specimens during the acute phase of infection. The lowest  concentration of SARS-CoV-2 viral copies this assay can detect is 250  copies / mL. A negative result does not preclude SARS-CoV-2 infection  and should not be used as the sole basis for treatment or other  patient management decisions.  A negative result may occur with  improper specimen collection / handling, submission of specimen other  than nasopharyngeal swab, presence of viral mutation(s) within the  areas targeted by this assay, and inadequate  number of viral copies  (<250 copies / mL). A negative result must be combined with clinical  observations, patient history, and epidemiological information. If result is POSITIVE SARS-CoV-2 target nucleic acids are DETECTED. The SARS-CoV-2 RNA is generally detectable in upper and lower  respiratory specimens dur ing the acute phase of infection.  Positive  results are indicative of active infection with SARS-CoV-2.  Clinical  correlation with patient history and other diagnostic information is  necessary to determine patient infection status.  Positive results do  not rule out bacterial infection or co-infection with other viruses. If result is PRESUMPTIVE POSTIVE SARS-CoV-2 nucleic acids MAY BE PRESENT.   A presumptive positive result was obtained on the submitted specimen  and confirmed on repeat testing.  While 2019 novel coronavirus  (SARS-CoV-2) nucleic acids may be present in the submitted sample  additional confirmatory testing may be necessary for epidemiological  and / or clinical management purposes  to differentiate between  SARS-CoV-2 and other Sarbecovirus currently known to infect humans.  If clinically indicated additional testing with an alternate test  methodology [id number]) is advised. The SARS-CoV-2 RNA is generally  detectable in upper and lower respiratory sp ecimens during the acute  phase of infection. The expected result is Negative. Fact Sheet for Patients:  StrictlyIdeas.no Fact Sheet for Healthcare Providers: BankingDealers.co.za This test is not yet approved or cleared by the [place] FDA and has been authorized for detection and/or diagnosis of SARS-CoV-2 by FDA under an Emergency Use Authorization (EUA).  This EUA will remain in effect (meaning this test can be used) for the duration of the COVID-19 declaration under Section 564(b)(1) of the Act, 21 U.S.C. section 360bbb-3(b)(1), unless the  authorization is terminated or revoked sooner. Performed at [facility], [place] [address], [place], [place] [address]   SARS CORONAVIRUS 2 (TAT 6-24 HRS) Nasopharyngeal Nasopharyngeal Swab     Status: None   Collection Time: 03/07/19  7:40 AM   Specimen: Nasopharyngeal Swab  Result Value Ref Range Status   SARS Coronavirus 2 NEGATIVE NEGATIVE Final    Comment: (NOTE) SARS-CoV-2 target nucleic acids are NOT DETECTED. The SARS-CoV-2 RNA is generally detectable in upper and lower respiratory specimens during the acute phase of infection. Negative results do not preclude SARS-CoV-2 infection, do not rule out co-infections with other pathogens, and should not be used as the sole basis for treatment or other patient management decisions. Negative results must be combined with clinical observations, patient history, and epidemiological information. The expected result is Negative. Fact Sheet for Patients: SugarRoll.be Fact Sheet for Healthcare Providers: https://www.woods-mathews.com/ This test is not yet approved or cleared by the [place] FDA and  has been authorized for detection and/or diagnosis of SARS-CoV-2 by FDA under an Emergency Use Authorization (EUA). This EUA will remain  in effect (meaning this test can be used) for the duration of the COVID-19 declaration under  Section 56 4(b)(1) of the Act, 21 U.S.C. section 360bbb-3(b)(1), unless the authorization is terminated or revoked sooner. Performed at [facility], [place] [address], [place], [place] [address]   Stat Gram stain     Status: None   Collection Time: 03/11/19 12:46 PM   Specimen: Pleura; Body Fluid  Result Value Ref Range Status   Specimen Description PLEURAL LEFT  Final   Special Requests Normal  Final   Gram Stain   Final    NO WBC SEEN NO ORGANISMS SEEN Performed at [facility], [address], [place], [place] [address]    Report Status  03/11/2019 FINAL  Final  Gram stain     Status: None   Collection Time: 03/12/19 10:37 AM   Specimen: PATH Cytology Pleural fluid  Result Value Ref Range Status   Specimen Description PLEURAL RIGHT  Final   Special Requests NONE  Final   Gram Stain   Final    RARE WBC PRESENT, PREDOMINANTLY MONONUCLEAR NO ORGANISMS SEEN Performed at [facility], [place] [address], [place], [place] [address]    Report Status 03/12/2019 FINAL  Final  Culture, body fluid-bottle     Status: None   Collection Time: 03/12/19 10:37 AM   Specimen: Pleura  Result Value Ref Range Status   Specimen Description PLEURAL RIGHT  Final   Special Requests NONE  Final   Culture   Final    NO GROWTH 5 DAYS Performed at [facility] [address], [place], [place] [address]    Report Status 03/17/2019 FINAL  Final    Coagulation Studies: No results for input(s): LABPROT, INR in the last 72 hours.  Urinalysis: No results for input(s): COLORURINE, LABSPEC, PHURINE, GLUCOSEU, HGBUR, BILIRUBINUR, KETONESUR, PROTEINUR, UROBILINOGEN, NITRITE, LEUKOCYTESUR in the last 72 hours.  Invalid input(s): APPERANCEUR    Imaging: No results found.   Medications:     iohexol  Assessment/ Plan:  73 y.o. male with a PMHx of BPH, allergic rhinitis, COPD, diabetes mellitus type 2, hypertension, GERD, acute respiratory failure, congestive heart failure, chronic kidney disease stage IIIb EGFR 43 who was admitted to [facility] on 02/15/2019 for ongoing management of acute respiratory failure.   1.  Acute kidney injury/chronic kidney disease stage IIIb baseline GFR 43.    Patient due for hemodialysis today.  Orders have been prepared.  Continue dialysis on MWF schedule.  2.  Acute respiratory failure.  Patient has been difficult to wean from the ventilator.  We continue the patient on ultrafiltration to help aid with weaning.  3.  Anemia of chronic kidney disease.  Hemoglobin down to 6.6.  Recommend blood transfusion  but defer to primary team.  4.  Secondary hyperparathyroidism.  Phosphorus currently 2.7.  Continue to monitor. LOS: 0 Undine Nealis 11/25/20208:47 AM

## 2019-03-31 DIAGNOSIS — N179 Acute kidney failure, unspecified: Secondary | ICD-10-CM | POA: Diagnosis not present

## 2019-03-31 DIAGNOSIS — I5043 Acute on chronic combined systolic (congestive) and diastolic (congestive) heart failure: Secondary | ICD-10-CM | POA: Diagnosis not present

## 2019-03-31 DIAGNOSIS — J9621 Acute and chronic respiratory failure with hypoxia: Secondary | ICD-10-CM | POA: Diagnosis not present

## 2019-03-31 DIAGNOSIS — J449 Chronic obstructive pulmonary disease, unspecified: Secondary | ICD-10-CM | POA: Diagnosis not present

## 2019-03-31 LAB — TYPE AND SCREEN
ABO/RH(D): B POS
Antibody Screen: NEGATIVE
Unit division: 0

## 2019-03-31 LAB — CBC
HCT: 25.4 % — ABNORMAL LOW (ref 39.0–52.0)
Hemoglobin: 8.3 g/dL — ABNORMAL LOW (ref 13.0–17.0)
MCH: 28.7 pg (ref 26.0–34.0)
MCHC: 32.7 g/dL (ref 30.0–36.0)
MCV: 87.9 fL (ref 80.0–100.0)
Platelets: 167 10*3/uL (ref 150–400)
RBC: 2.89 MIL/uL — ABNORMAL LOW (ref 4.22–5.81)
RDW: 15.2 % (ref 11.5–15.5)
WBC: 9 10*3/uL (ref 4.0–10.5)
nRBC: 0 % (ref 0.0–0.2)

## 2019-03-31 LAB — BPAM RBC
Blood Product Expiration Date: 202012202359
ISSUE DATE / TIME: 202011251315
Unit Type and Rh: 7300

## 2019-03-31 NOTE — Progress Notes (Signed)
Pulmonary Critical Care Medicine [place]   PULMONARY CRITICAL CARE SERVICE  PROGRESS NOTE  Date of Service: 03/31/2019  Paul Ball  [id number]  DOB: June 08, 1945   DOA: 02/15/2019  Referring Physician: [name], MD  HPI: Paul Ball is a 73 y.o. male seen for follow up of Acute on Chronic Respiratory Failure.  Patient currently is on assist control mode has been on 30% FiO2 with a PEEP of 5  Medications: Reviewed on Rounds  Physical Exam:  Vitals: Temperature 98.6 pulse 85 respiratory rate 20 blood pressure 140/58 saturations 99%  Ventilator Settings mode ventilation assist control FiO2 30% tidal line 525 PEEP 5  . General: Comfortable at this time . Eyes: Grossly normal lids, irises & conjunctiva . ENT: grossly tongue is normal . Neck: no obvious mass . Cardiovascular: S1 S2 normal no gallop . Respiratory: No rhonchi coarse breath sounds . Abdomen: soft . Skin: no rash seen on limited exam . Musculoskeletal: not rigid . Psychiatric:unable to assess . Neurologic: no seizure no involuntary movements         Lab Data:   Basic Metabolic Panel: Recent Labs  Lab 03/25/19 0527 03/28/19 0631 03/30/19 0541  NA 132* 132* 132*  K 3.9 4.1 3.8  CL 95* 94* 96*  CO2 26 27 26   GLUCOSE 182* 193* 154*  BUN 77* 88* 75*  CREATININE 3.13* 3.41* 2.87*  CALCIUM 8.8* 8.9 8.6*  PHOS 2.8 3.3 2.7    ABG: No results for input(s): PHART, PCO2ART, PO2ART, HCO3, O2SAT in the last 168 hours.  Liver Function Tests: Recent Labs  Lab 03/25/19 0527 03/28/19 0631 03/30/19 0541  ALBUMIN 1.7* 1.7* 1.7*   No results for input(s): LIPASE, AMYLASE in the last 168 hours. No results for input(s): AMMONIA in the last 168 hours.  CBC: Recent Labs  Lab 03/26/19 1122 03/28/19 0631 03/29/19 0545 03/30/19 0541 03/31/19 0456  WBC 7.9 9.0 9.4 6.9 9.0  HGB 8.2* 8.2* 8.0* 6.6* 8.3*  HCT 25.2* 24.9* 24.4* 20.3* 25.4*  MCV 87.5 86.8 87.1 88.6 87.9  PLT 185  189 171 172 167    Cardiac Enzymes: No results for input(s): CKTOTAL, CKMB, CKMBINDEX, TROPONINI in the last 168 hours.  BNP (last 3 results) No results for input(s): BNP in the last 8760 hours.  ProBNP (last 3 results) No results for input(s): PROBNP in the last 8760 hours.  Radiological Exams: No results found.  Assessment/Plan Active Problems:   Acute on chronic respiratory failure with hypoxia (HCC)   Acute on chronic systolic and diastolic heart failure, NYHA class 3 (HCC)   Acute renal failure superimposed on stage 3 chronic kidney disease (HCC)   Healthcare-associated pneumonia   COPD, severe ([place])   1. Acute on chronic respiratory failure hypoxia we will continue with the assist control mode.  Patient has not been able to tolerate any weaning we will continue with baseline settings on assist control 2. Acute on chronic systolic and diastolic heart failure at baseline we will continue present management 3. Healthcare associated pneumonia treated we will continue to follow 4. Acute on chronic stage III chronic kidney disease follow-up labs 5. Severe COPD we will continue with present management   I have personally seen and evaluated the patient, evaluated laboratory and imaging results, formulated the assessment and plan and placed orders. The Patient requires high complexity decision making for assessment and support.  Case was discussed on Rounds with the Respiratory Therapy Staff  Allyne Gee, MD [facility] Pulmonary Critical  Care Medicine Sleep Medicine

## 2019-04-01 DIAGNOSIS — N179 Acute kidney failure, unspecified: Secondary | ICD-10-CM | POA: Diagnosis not present

## 2019-04-01 DIAGNOSIS — I5043 Acute on chronic combined systolic (congestive) and diastolic (congestive) heart failure: Secondary | ICD-10-CM | POA: Diagnosis not present

## 2019-04-01 DIAGNOSIS — J9621 Acute and chronic respiratory failure with hypoxia: Secondary | ICD-10-CM | POA: Diagnosis not present

## 2019-04-01 DIAGNOSIS — J449 Chronic obstructive pulmonary disease, unspecified: Secondary | ICD-10-CM | POA: Diagnosis not present

## 2019-04-01 LAB — RENAL FUNCTION PANEL
Albumin: 1.8 g/dL — ABNORMAL LOW (ref 3.5–5.0)
Anion gap: 13 (ref 5–15)
BUN: 70 mg/dL — ABNORMAL HIGH (ref 8–23)
CO2: 26 mmol/L (ref 22–32)
Calcium: 8.7 mg/dL — ABNORMAL LOW (ref 8.9–10.3)
Chloride: 92 mmol/L — ABNORMAL LOW (ref 98–111)
Creatinine, Ser: 2.72 mg/dL — ABNORMAL HIGH (ref 0.61–1.24)
GFR calc Af Amer: 26 mL/min — ABNORMAL LOW (ref >60)
GFR calc non Af Amer: 22 mL/min — ABNORMAL LOW (ref >60)
Glucose, Bld: 161 mg/dL — ABNORMAL HIGH (ref 70–99)
Phosphorus: 2.6 mg/dL (ref 2.5–4.6)
Potassium: 3.8 mmol/L (ref 3.5–5.1)
Sodium: 131 mmol/L — ABNORMAL LOW (ref 135–145)

## 2019-04-01 LAB — CBC
HCT: 22.7 % — ABNORMAL LOW (ref 39.0–52.0)
Hemoglobin: 7.6 g/dL — ABNORMAL LOW (ref 13.0–17.0)
MCH: 28.8 pg (ref 26.0–34.0)
MCHC: 33.5 g/dL (ref 30.0–36.0)
MCV: 86 fL (ref 80.0–100.0)
Platelets: 171 10*3/uL (ref 150–400)
RBC: 2.64 MIL/uL — ABNORMAL LOW (ref 4.22–5.81)
RDW: 14.9 % (ref 11.5–15.5)
WBC: 8.8 10*3/uL (ref 4.0–10.5)
nRBC: 0 % (ref 0.0–0.2)

## 2019-04-01 NOTE — Progress Notes (Signed)
Pulmonary Critical Care Medicine [place]   PULMONARY CRITICAL CARE SERVICE  PROGRESS NOTE  Date of Service: 04/01/2019  Paul Ball  [id number]  DOB: Jun 18, 1945   DOA: 02/15/2019  Referring Physician: [name], MD  HPI: Paul Ball is a 73 y.o. male seen for follow up of Acute on Chronic Respiratory Failure.  Patient currently is on assist control mode has been on 35% FiO2 currently tidal volume is 450 with a PEEP of 5  Medications: Reviewed on Rounds  Physical Exam:  Vitals: Temperature 98.6 pulse 93 respiratory rate 21 blood pressure 125/59 saturations 93%  Ventilator Settings mode of ventilation assist control FiO2 35% tidal volume 450 PEEP 5  . General: Comfortable at this time . Eyes: Grossly normal lids, irises & conjunctiva . ENT: grossly tongue is normal . Neck: no obvious mass . Cardiovascular: S1 S2 normal no gallop . Respiratory: No rhonchi no rales are noted at this time . Abdomen: soft . Skin: no rash seen on limited exam . Musculoskeletal: not rigid . Psychiatric:unable to assess . Neurologic: no seizure no involuntary movements         Lab Data:   Basic Metabolic Panel: Recent Labs  Lab 03/28/19 0631 03/30/19 0541 04/01/19 0444  NA 132* 132* 131*  K 4.1 3.8 3.8  CL 94* 96* 92*  CO2 27 26 26   GLUCOSE 193* 154* 161*  BUN 88* 75* 70*  CREATININE 3.41* 2.87* 2.72*  CALCIUM 8.9 8.6* 8.7*  PHOS 3.3 2.7 2.6    ABG: No results for input(s): PHART, PCO2ART, PO2ART, HCO3, O2SAT in the last 168 hours.  Liver Function Tests: Recent Labs  Lab 03/28/19 0631 03/30/19 0541 04/01/19 0444  ALBUMIN 1.7* 1.7* 1.8*   No results for input(s): LIPASE, AMYLASE in the last 168 hours. No results for input(s): AMMONIA in the last 168 hours.  CBC: Recent Labs  Lab 03/28/19 0631 03/29/19 0545 03/30/19 0541 03/31/19 0456 04/01/19 0444  WBC 9.0 9.4 6.9 9.0 8.8  HGB 8.2* 8.0* 6.6* 8.3* 7.6*  HCT 24.9* 24.4* 20.3* 25.4*  22.7*  MCV 86.8 87.1 88.6 87.9 86.0  PLT 189 171 172 167 171    Cardiac Enzymes: No results for input(s): CKTOTAL, CKMB, CKMBINDEX, TROPONINI in the last 168 hours.  BNP (last 3 results) No results for input(s): BNP in the last 8760 hours.  ProBNP (last 3 results) No results for input(s): PROBNP in the last 8760 hours.  Radiological Exams: No results found.  Assessment/Plan Active Problems:   Acute on chronic respiratory failure with hypoxia (HCC)   Acute on chronic systolic and diastolic heart failure, NYHA class 3 (HCC)   Acute renal failure superimposed on stage 3 chronic kidney disease (HCC)   Healthcare-associated pneumonia   COPD, severe ([place])   1. Acute on chronic respiratory failure hypoxia plan is to continue with the assist control mode patient requiring 35% FiO2. 2. Acute on chronic diastolic heart failure systolic heart failure monitor fluid status patient is being followed by nephrology has not been doing well overall 3. Acute renal failure dialysis per nephrology 4. Healthcare associated pneumonia treated 5. Severe COPD patient is at baseline we will continue to monitor   I have personally seen and evaluated the patient, evaluated laboratory and imaging results, formulated the assessment and plan and placed orders. The Patient requires high complexity decision making for assessment and support.  Case was discussed on Rounds with the Respiratory Therapy Staff  Allyne Gee, MD [facility] Pulmonary Critical Care  Medicine Sleep Medicine

## 2019-04-01 NOTE — Progress Notes (Signed)
[facility]  ROUNDING NOTE   Subjective:  Patient remains critically ill. Still ventilatory dependent. He will be due for dialysis today.   Wound healing  Vital signs in last 24 hours:  Temperature 97.8 pulse 93 respirations 30 blood pressure 124/70 Physical Exam: General: Critically ill-appearing  Head: Normocephalic, atraumatic. Moist oral mucosal membranes  Eyes: Anicteric  Neck: Tracheostomy in place  Lungs:  Scattered rhonchi, vent assisted  Heart: S1S2 no rubs  Abdomen:  Soft, nontender, bowel sounds present, PEG in place  Extremities: 2+ peripheral edema.  Neurologic: Not following commands  Skin: No rash  Access: Right internal jugular permcath    Basic Metabolic Panel: Recent Labs  Lab 03/28/19 0631 03/30/19 0541 04/01/19 0444  NA 132* 132* 131*  K 4.1 3.8 3.8  CL 94* 96* 92*  CO2 _0 GLUCOSE 193* 154* 161*  BUN 88* 75* 70*  CREATININE 3.41* 2.87* 2.72*  CALCIUM 8.9 8.6* 8.7*  PHOS 3.3 2.7 2.6    Liver Function Tests: Recent Labs  Lab 03/28/19 0631 03/30/19 0541 04/01/19 0444  ALBUMIN 1.7* 1.7* 1.8*   No results for input(s): LIPASE, AMYLASE in the last 168 hours. No results for input(s): AMMONIA in the last 168 hours.  CBC: Recent Labs  Lab 03/28/19 0631 03/29/19 0545 03/30/19 0541 03/31/19 0456 04/01/19 0444  WBC 9.0 9.4 6.9 9.0 8.8  HGB 8.2* 8.0* 6.6* 8.3* 7.6*  HCT 24.9* 24.4* 20.3* 25.4* 22.7*  MCV 86.8 87.1 88.6 87.9 86.0  PLT 189 171 172 167 171    Cardiac Enzymes: No results for input(s): CKTOTAL, CKMB, CKMBINDEX, TROPONINI in the last 168 hours.  BNP: Invalid input(s): POCBNP  CBG: No results for input(s): GLUCAP in the last 168 hours.  Microbiology: Results for orders placed or performed during the hospital encounter of 02/15/19  Culture, Urine     Status: None   Collection Time: 02/15/19 12:52 AM   Specimen: Urine, Random  Result Value Ref Range Status   Specimen Description URINE, RANDOM   Final   Special Requests NONE  Final   Culture   Final    NO GROWTH Performed at [facility], [place] [address], [place], [place] [address]    Report Status 02/17/2019 FINAL  Final  Culture, respiratory (non-expectorated)     Status: None   Collection Time: 02/15/19 10:50 PM   Specimen: Tracheal Aspirate; Respiratory  Result Value Ref Range Status   Specimen Description TRACHEAL ASPIRATE  Final   Special Requests NONE  Final   Gram Stain   Final    NO WBC SEEN RARE GRAM POSITIVE COCCI IN PAIRS Performed at [facility], [address] [place], [place] [address]    Culture MODERATE ENTEROCOCCUS FAECALIS  Final   Report Status 02/19/2019 FINAL  Final   Organism ID, Bacteria ENTEROCOCCUS FAECALIS  Final      Susceptibility   Enterococcus faecalis - MIC*    AMPICILLIN <=2 SENSITIVE Sensitive     VANCOMYCIN 1 SENSITIVE Sensitive     GENTAMICIN SYNERGY RESISTANT Resistant     * MODERATE ENTEROCOCCUS FAECALIS  SARS Coronavirus 2 by RT PCR (hospital order, performed in [facility] hospital lab) Nasopharyngeal Nasopharyngeal Swab     Status: None   Collection Time: 02/16/19  2:20 PM   Specimen: Nasopharyngeal Swab  Result Value Ref Range Status   SARS Coronavirus 2 NEGATIVE NEGATIVE Final    Comment: (NOTE) If result is NEGATIVE SARS-CoV-2 target nucleic acids are  NOT DETECTED. The SARS-CoV-2 RNA is generally detectable in upper and lower  respiratory specimens during the acute phase of infection. The lowest  concentration of SARS-CoV-2 viral copies this assay can detect is 250  copies / mL. A negative result does not preclude SARS-CoV-2 infection  and should not be used as the sole basis for treatment or other  patient management decisions.  A negative result may occur with  improper specimen collection / handling, submission of specimen other  than nasopharyngeal swab, presence of viral mutation(s) within the  areas targeted by this assay, and inadequate number of viral  copies  (<250 copies / mL). A negative result must be combined with clinical  observations, patient history, and epidemiological information. If result is POSITIVE SARS-CoV-2 target nucleic acids are DETECTED. The SARS-CoV-2 RNA is generally detectable in upper and lower  respiratory specimens dur ing the acute phase of infection.  Positive  results are indicative of active infection with SARS-CoV-2.  Clinical  correlation with patient history and other diagnostic information is  necessary to determine patient infection status.  Positive results do  not rule out bacterial infection or co-infection with other viruses. If result is PRESUMPTIVE POSTIVE SARS-CoV-2 nucleic acids MAY BE PRESENT.   A presumptive positive result was obtained on the submitted specimen  and confirmed on repeat testing.  While 2019 novel coronavirus  (SARS-CoV-2) nucleic acids may be present in the submitted sample  additional confirmatory testing may be necessary for epidemiological  and / or clinical management purposes  to differentiate between  SARS-CoV-2 and other Sarbecovirus currently known to infect humans.  If clinically indicated additional testing with an alternate test  methodology [phone]) is advised. The SARS-CoV-2 RNA is generally  detectable in upper and lower respiratory sp ecimens during the acute  phase of infection. The expected result is Negative. Fact Sheet for Patients:  StrictlyIdeas.no Fact Sheet for Healthcare Providers: BankingDealers.co.za This test is not yet approved or cleared by the [place] FDA and has been authorized for detection and/or diagnosis of SARS-CoV-2 by FDA under an Emergency Use Authorization (EUA).  This EUA will remain in effect (meaning this test can be used) for the duration of the COVID-19 declaration under Section 564(b)(1) of the Act, 21 U.S.C. section 360bbb-3(b)(1), unless the authorization is terminated  or revoked sooner. Performed at [facility], [place] [address] [place], [place] [address]   SARS CORONAVIRUS 2 (TAT 6-24 HRS) Nasopharyngeal Nasopharyngeal Swab     Status: None   Collection Time: 03/07/19  7:40 AM   Specimen: Nasopharyngeal Swab  Result Value Ref Range Status   SARS Coronavirus 2 NEGATIVE NEGATIVE Final    Comment: (NOTE) SARS-CoV-2 target nucleic acids are NOT DETECTED. The SARS-CoV-2 RNA is generally detectable in upper and lower respiratory specimens during the acute phase of infection. Negative results do not preclude SARS-CoV-2 infection, do not rule out co-infections with other pathogens, and should not be used as the sole basis for treatment or other patient management decisions. Negative results must be combined with clinical observations, patient history, and epidemiological information. The expected result is Negative. Fact Sheet for Patients: SugarRoll.be Fact Sheet for Healthcare Providers: https://www.woods-mathews.com/ This test is not yet approved or cleared by the [place] FDA and  has been authorized for detection and/or diagnosis of SARS-CoV-2 by FDA under an Emergency Use Authorization (EUA). This EUA will remain  in effect (meaning this test can be used) for the duration of the COVID-19 declaration under Section 56  4(b)(1) of the Act, 21 U.S.C. section 360bbb-3(b)(1), unless the authorization is terminated or revoked sooner. Performed at [facility], [place] [address] [place], [place] [address]   Stat Gram stain     Status: None   Collection Time: 03/11/19 12:46 PM   Specimen: Pleura; Body Fluid  Result Value Ref Range Status   Specimen Description PLEURAL LEFT  Final   Special Requests Normal  Final   Gram Stain   Final    NO WBC SEEN NO ORGANISMS SEEN Performed at [facility], [address], [place], [place] [address]    Report Status 03/11/2019 FINAL  Final  Gram  stain     Status: None   Collection Time: 03/12/19 10:37 AM   Specimen: PATH Cytology Pleural fluid  Result Value Ref Range Status   Specimen Description PLEURAL RIGHT  Final   Special Requests NONE  Final   Gram Stain   Final    RARE WBC PRESENT, PREDOMINANTLY MONONUCLEAR NO ORGANISMS SEEN Performed at [facility], [place] [address], [place], [place] [address]    Report Status 03/12/2019 FINAL  Final  Culture, body fluid-bottle     Status: None   Collection Time: 03/12/19 10:37 AM   Specimen: Pleura  Result Value Ref Range Status   Specimen Description PLEURAL RIGHT  Final   Special Requests NONE  Final   Culture   Final    NO GROWTH 5 DAYS Performed at [facility] [address] [place], [place] [address]    Report Status 03/17/2019 FINAL  Final    Coagulation Studies: No results for input(s): LABPROT, INR in the last 72 hours.  Urinalysis: No results for input(s): COLORURINE, LABSPEC, PHURINE, GLUCOSEU, HGBUR, BILIRUBINUR, KETONESUR, PROTEINUR, UROBILINOGEN, NITRITE, LEUKOCYTESUR in the last 72 hours.  Invalid input(s): APPERANCEUR    Imaging: No results found.   Medications:     iohexol  Assessment/ Plan:  73 y.o. male with a PMHx of BPH, allergic rhinitis, COPD, diabetes mellitus type 2, hypertension, GERD, acute respiratory failure, congestive heart failure, chronic kidney disease stage IIIb EGFR 43 who was admitted to [facility] on 02/15/2019 for ongoing management of acute respiratory failure.   1.  Acute kidney injury/chronic kidney disease stage IIIb baseline GFR 43.    Dialysis orders have been prepared for today.  Patient was maintained on current dialysis schedule.  2.  Acute respiratory failure.  Patient remains on the ventilator per minute with FiO2 30% with PEEP of 5.  Pulmonary/critical care following.  3.  Anemia of chronic kidney disease.  Hemoglobin currently 7.6 posttransfusion.  Continue to monitor.  4.  Secondary  hyperparathyroidism.  Phosphorus came back at 2.6 today.  Overall remains quite malnourished as well as albumin is 1.8.  Continue to monitor bone mineral metabolism parameters. LOS: 0   11/27/20208:30 AM

## 2019-04-02 DIAGNOSIS — J9621 Acute and chronic respiratory failure with hypoxia: Secondary | ICD-10-CM | POA: Diagnosis not present

## 2019-04-02 DIAGNOSIS — J449 Chronic obstructive pulmonary disease, unspecified: Secondary | ICD-10-CM | POA: Diagnosis not present

## 2019-04-02 DIAGNOSIS — N179 Acute kidney failure, unspecified: Secondary | ICD-10-CM | POA: Diagnosis not present

## 2019-04-02 DIAGNOSIS — I5043 Acute on chronic combined systolic (congestive) and diastolic (congestive) heart failure: Secondary | ICD-10-CM | POA: Diagnosis not present

## 2019-04-02 LAB — CBC
HCT: 23.1 % — ABNORMAL LOW (ref 39.0–52.0)
Hemoglobin: 7.6 g/dL — ABNORMAL LOW (ref 13.0–17.0)
MCH: 28.7 pg (ref 26.0–34.0)
MCHC: 32.9 g/dL (ref 30.0–36.0)
MCV: 87.2 fL (ref 80.0–100.0)
Platelets: 158 10*3/uL (ref 150–400)
RBC: 2.65 MIL/uL — ABNORMAL LOW (ref 4.22–5.81)
RDW: 15 % (ref 11.5–15.5)
WBC: 8.1 10*3/uL (ref 4.0–10.5)
nRBC: 0 % (ref 0.0–0.2)

## 2019-04-02 NOTE — Progress Notes (Signed)
Pulmonary Critical Care Medicine [place]   PULMONARY CRITICAL CARE SERVICE  PROGRESS NOTE  Date of Service: 04/02/2019  Beulah Soppe  [name]  DOB: 1945/07/16   DOA: 02/15/2019  Referring Physician: [name], MD  HPI: Paul Ball is a 73 y.o. male seen for follow up of Acute on Chronic Respiratory Failure.  Continues to fail weaning attempts remains on full support and assist control mode has been on 30% FiO2 with PEEP of 5  Medications: Reviewed on Rounds  Physical Exam:  Vitals: Temperature 97.4 pulse 86 respiratory rate 20 blood pressure 101/49 saturations 96%  Ventilator Settings mode ventilation assist control FiO2 30% tidal line 420 PEEP 5  . General: Comfortable at this time . Eyes: Grossly normal lids, irises & conjunctiva . ENT: grossly tongue is normal . Neck: no obvious mass . Cardiovascular: S1 S2 normal no gallop . Respiratory: No rhonchi no rales are noted . Abdomen: soft . Skin: no rash seen on limited exam . Musculoskeletal: not rigid . Psychiatric:unable to assess . Neurologic: no seizure no involuntary movements         Lab Data:   Basic Metabolic Panel: Recent Labs  Lab 03/28/19 0631 03/30/19 0541 04/01/19 0444  NA 132* 132* 131*  K 4.1 3.8 3.8  CL 94* 96* 92*  CO2 27 26 26   GLUCOSE 193* 154* 161*  BUN 88* 75* 70*  CREATININE 3.41* 2.87* 2.72*  CALCIUM 8.9 8.6* 8.7*  PHOS 3.3 2.7 2.6    ABG: No results for input(s): PHART, PCO2ART, PO2ART, HCO3, O2SAT in the last 168 hours.  Liver Function Tests: Recent Labs  Lab 03/28/19 0631 03/30/19 0541 04/01/19 0444  ALBUMIN 1.7* 1.7* 1.8*   No results for input(s): LIPASE, AMYLASE in the last 168 hours. No results for input(s): AMMONIA in the last 168 hours.  CBC: Recent Labs  Lab 03/29/19 0545 03/30/19 0541 03/31/19 0456 04/01/19 0444 04/02/19 0538  WBC 9.4 6.9 9.0 8.8 8.1  HGB 8.0* 6.6* 8.3* 7.6* 7.6*  HCT 24.4* 20.3* 25.4* 22.7* 23.1*  MCV  87.1 88.6 87.9 86.0 87.2  PLT 171 172 167 171 158    Cardiac Enzymes: No results for input(s): CKTOTAL, CKMB, CKMBINDEX, TROPONINI in the last 168 hours.  BNP (last 3 results) No results for input(s): BNP in the last 8760 hours.  ProBNP (last 3 results) No results for input(s): PROBNP in the last 8760 hours.  Radiological Exams: No results found.  Assessment/Plan Active Problems:   Acute on chronic respiratory failure with hypoxia (HCC)   Acute on chronic systolic and diastolic heart failure, NYHA class 3 (HCC)   Acute renal failure superimposed on stage 3 chronic kidney disease (HCC)   Healthcare-associated pneumonia   COPD, severe ([place])   1. Acute on chronic respiratory failure hypoxia we will continue with full vent support patient is failed weaning attempts 2. Acute on chronic systolic heart failure continue with supportive care 3. Acute renal failure patient is at baseline 4. Healthcare associated pneumonia treated 5. Severe COPD at baseline we will continue to follow along   I have personally seen and evaluated the patient, evaluated laboratory and imaging results, formulated the assessment and plan and placed orders. The Patient requires high complexity decision making for assessment and support.  Case was discussed on Rounds with the Respiratory Therapy Staff  Allyne Gee, MD [facility] Pulmonary Critical Care Medicine Sleep Medicine

## 2019-04-03 DIAGNOSIS — J449 Chronic obstructive pulmonary disease, unspecified: Secondary | ICD-10-CM | POA: Diagnosis not present

## 2019-04-03 DIAGNOSIS — N179 Acute kidney failure, unspecified: Secondary | ICD-10-CM | POA: Diagnosis not present

## 2019-04-03 DIAGNOSIS — I5043 Acute on chronic combined systolic (congestive) and diastolic (congestive) heart failure: Secondary | ICD-10-CM | POA: Diagnosis not present

## 2019-04-03 DIAGNOSIS — J9621 Acute and chronic respiratory failure with hypoxia: Secondary | ICD-10-CM | POA: Diagnosis not present

## 2019-04-03 NOTE — Progress Notes (Signed)
Pulmonary Critical Care Medicine [place]   PULMONARY CRITICAL CARE SERVICE  PROGRESS NOTE  Date of Service: 04/03/2019  Paul Ball  [id number]  DOB: 1945/09/10   DOA: 02/15/2019  Referring Physician: [name], MD  HPI: Paul Ball is a 73 y.o. male seen for follow up of Acute on Chronic Respiratory Failure.  Patient right now is on full support on assist control mode has been on 30% FiO2 currently on a PEEP of 5  Medications: Reviewed on Rounds  Physical Exam:  Vitals: Temperature 98.3 pulse 74 respiratory rate 26 blood pressure 122/54 saturations 97%  Ventilator Settings mode ventilation assist control FiO2 is 30% tidal volume 490 PEEP 5  . General: Comfortable at this time . Eyes: Grossly normal lids, irises & conjunctiva . ENT: grossly tongue is normal . Neck: no obvious mass . Cardiovascular: S1 S2 normal no gallop . Respiratory: No rhonchi no rales are noted at this time . Abdomen: soft . Skin: no rash seen on limited exam . Musculoskeletal: not rigid . Psychiatric:unable to assess . Neurologic: no seizure no involuntary movements         Lab Data:   Basic Metabolic Panel: Recent Labs  Lab 03/28/19 0631 03/30/19 0541 04/01/19 0444  NA 132* 132* 131*  K 4.1 3.8 3.8  CL 94* 96* 92*  CO2 27 26 26   GLUCOSE 193* 154* 161*  BUN 88* 75* 70*  CREATININE 3.41* 2.87* 2.72*  CALCIUM 8.9 8.6* 8.7*  PHOS 3.3 2.7 2.6    ABG: No results for input(s): PHART, PCO2ART, PO2ART, HCO3, O2SAT in the last 168 hours.  Liver Function Tests: Recent Labs  Lab 03/28/19 0631 03/30/19 0541 04/01/19 0444  ALBUMIN 1.7* 1.7* 1.8*   No results for input(s): LIPASE, AMYLASE in the last 168 hours. No results for input(s): AMMONIA in the last 168 hours.  CBC: Recent Labs  Lab 03/29/19 0545 03/30/19 0541 03/31/19 0456 04/01/19 0444 04/02/19 0538  WBC 9.4 6.9 9.0 8.8 8.1  HGB 8.0* 6.6* 8.3* 7.6* 7.6*  HCT 24.4* 20.3* 25.4* 22.7* 23.1*   MCV 87.1 88.6 87.9 86.0 87.2  PLT 171 172 167 171 158    Cardiac Enzymes: No results for input(s): CKTOTAL, CKMB, CKMBINDEX, TROPONINI in the last 168 hours.  BNP (last 3 results) No results for input(s): BNP in the last 8760 hours.  ProBNP (last 3 results) No results for input(s): PROBNP in the last 8760 hours.  Radiological Exams: No results found.  Assessment/Plan Active Problems:   Acute on chronic respiratory failure with hypoxia (HCC)   Acute on chronic systolic and diastolic heart failure, NYHA class 3 (HCC)   Acute renal failure superimposed on stage 3 chronic kidney disease (HCC)   Healthcare-associated pneumonia   COPD, severe ([place])   1. Acute on chronic respiratory failure with hypoxia plan is to continue with full vent support patient has not been able to tolerate any weaning. 2. Acute on chronic systolic and diastolic heart failure monitor fluid status continue with supportive care 3. Acute renal failure stage III followed by nephrology for dialysis 4. Healthcare associated pneumonia treated 5. COPD severe disease we will continue to follow along   I have personally seen and evaluated the patient, evaluated laboratory and imaging results, formulated the assessment and plan and placed orders. The Patient requires high complexity decision making for assessment and support.  Case was discussed on Rounds with the Respiratory Therapy Staff  Allyne Gee, MD [facility] Pulmonary Critical Care Medicine Sleep  Medicine

## 2019-04-04 DIAGNOSIS — N179 Acute kidney failure, unspecified: Secondary | ICD-10-CM | POA: Diagnosis not present

## 2019-04-04 DIAGNOSIS — J449 Chronic obstructive pulmonary disease, unspecified: Secondary | ICD-10-CM | POA: Diagnosis not present

## 2019-04-04 DIAGNOSIS — I5043 Acute on chronic combined systolic (congestive) and diastolic (congestive) heart failure: Secondary | ICD-10-CM | POA: Diagnosis not present

## 2019-04-04 DIAGNOSIS — J9621 Acute and chronic respiratory failure with hypoxia: Secondary | ICD-10-CM | POA: Diagnosis not present

## 2019-04-04 LAB — RENAL FUNCTION PANEL
Albumin: 1.7 g/dL — ABNORMAL LOW (ref 3.5–5.0)
Anion gap: 12 (ref 5–15)
BUN: 91 mg/dL — ABNORMAL HIGH (ref 8–23)
CO2: 26 mmol/L (ref 22–32)
Calcium: 8.7 mg/dL — ABNORMAL LOW (ref 8.9–10.3)
Chloride: 92 mmol/L — ABNORMAL LOW (ref 98–111)
Creatinine, Ser: 3.1 mg/dL — ABNORMAL HIGH (ref 0.61–1.24)
GFR calc Af Amer: 22 mL/min — ABNORMAL LOW (ref >60)
GFR calc non Af Amer: 19 mL/min — ABNORMAL LOW (ref >60)
Glucose, Bld: 164 mg/dL — ABNORMAL HIGH (ref 70–99)
Phosphorus: 2.4 mg/dL — ABNORMAL LOW (ref 2.5–4.6)
Potassium: 3.9 mmol/L (ref 3.5–5.1)
Sodium: 130 mmol/L — ABNORMAL LOW (ref 135–145)

## 2019-04-04 LAB — PREPARE RBC (CROSSMATCH)

## 2019-04-04 LAB — CBC
HCT: 20.1 % — ABNORMAL LOW (ref 39.0–52.0)
Hemoglobin: 6.6 g/dL — CL (ref 13.0–17.0)
MCH: 28.3 pg (ref 26.0–34.0)
MCHC: 32.8 g/dL (ref 30.0–36.0)
MCV: 86.3 fL (ref 80.0–100.0)
Platelets: 168 10*3/uL (ref 150–400)
RBC: 2.33 MIL/uL — ABNORMAL LOW (ref 4.22–5.81)
RDW: 14.7 % (ref 11.5–15.5)
WBC: 7.8 10*3/uL (ref 4.0–10.5)
nRBC: 0 % (ref 0.0–0.2)

## 2019-04-04 NOTE — Progress Notes (Signed)
Pulmonary Critical Care Medicine [place]   PULMONARY CRITICAL CARE SERVICE  PROGRESS NOTE  Date of Service: 04/04/2019  Paul Ball  [id number]  DOB: 07/14/1945   DOA: 02/15/2019  Referring Physician: [name], MD  HPI: Paul Ball is a 73 y.o. male seen for follow up of Acute on Chronic Respiratory Failure.  Patient is on full support on the ventilator has not been tolerating weaning  Medications: Reviewed on Rounds  Physical Exam:  Vitals: Temperature 99.3 pulse 94 respiratory rate 27 blood pressure 155/59 saturations 98%  Ventilator Settings mode ventilation assist control FiO2 30% tidal volume 420 PEEP 5  . General: Comfortable at this time . Eyes: Grossly normal lids, irises & conjunctiva . ENT: grossly tongue is normal . Neck: no obvious mass . Cardiovascular: S1 S2 normal no gallop . Respiratory: No rhonchi no rales are noted at this time . Abdomen: soft . Skin: no rash seen on limited exam . Musculoskeletal: not rigid . Psychiatric:unable to assess . Neurologic: no seizure no involuntary movements         Lab Data:   Basic Metabolic Panel: Recent Labs  Lab 03/30/19 0541 04/01/19 0444 04/04/19 0643  NA 132* 131* 130*  K 3.8 3.8 3.9  CL 96* 92* 92*  CO2 26 26 26   GLUCOSE 154* 161* 164*  BUN 75* 70* 91*  CREATININE 2.87* 2.72* 3.10*  CALCIUM 8.6* 8.7* 8.7*  PHOS 2.7 2.6 2.4*    ABG: No results for input(s): PHART, PCO2ART, PO2ART, HCO3, O2SAT in the last 168 hours.  Liver Function Tests: Recent Labs  Lab 03/30/19 0541 04/01/19 0444 04/04/19 0643  ALBUMIN 1.7* 1.8* 1.7*   No results for input(s): LIPASE, AMYLASE in the last 168 hours. No results for input(s): AMMONIA in the last 168 hours.  CBC: Recent Labs  Lab 03/30/19 0541 03/31/19 0456 04/01/19 0444 04/02/19 0538 04/04/19 0643  WBC 6.9 9.0 8.8 8.1 7.8  HGB 6.6* 8.3* 7.6* 7.6* 6.6*  HCT 20.3* 25.4* 22.7* 23.1* 20.1*  MCV 88.6 87.9 86.0 87.2 86.3   PLT 172 167 171 158 168    Cardiac Enzymes: No results for input(s): CKTOTAL, CKMB, CKMBINDEX, TROPONINI in the last 168 hours.  BNP (last 3 results) No results for input(s): BNP in the last 8760 hours.  ProBNP (last 3 results) No results for input(s): PROBNP in the last 8760 hours.  Radiological Exams: No results found.  Assessment/Plan Active Problems:   Acute on chronic respiratory failure with hypoxia (HCC)   Acute on chronic systolic and diastolic heart failure, NYHA class 3 (HCC)   Acute renal failure superimposed on stage 3 chronic kidney disease (HCC)   Healthcare-associated pneumonia   COPD, severe ([place])   1. Acute on chronic respiratory failure hypoxia plan is to continue with full support on assist control mode patient's been on 30% FiO2 has not been tolerating any weaning attempts thus far 2. Acute on chronic systolic heart failure compensated we will continue present management patient is being seen by nephrology for fluid status 3. Acute renal failure as above seen by nephrology for dialysis 4. Healthcare associated pneumonia patient's not showing any difference at this point radiologically 5. Severe COPD at baseline continue supportive care   I have personally seen and evaluated the patient, evaluated laboratory and imaging results, formulated the assessment and plan and placed orders. The Patient requires high complexity decision making for assessment and support.  Case was discussed on Rounds with the Respiratory Therapy Staff  [facility]  Richardson Dopp, MD [facility] Pulmonary Critical Care Medicine Sleep Medicine

## 2019-04-04 NOTE — Progress Notes (Signed)
[facility]  ROUNDING NOTE   Subjective:  Patient seen and evaluated at bedside. Critical illness persist at this time. Still on the ventilator.   Wound healing  Vital signs in last 24 hours:  Temperature 99.3 pulse 91 respirations 27 blood pressure 155/59 Physical Exam: General: Critically ill-appearing  Head: Normocephalic, atraumatic. Moist oral mucosal membranes  Eyes: Anicteric  Neck: Tracheostomy in place  Lungs:  Scattered rhonchi, vent assisted  Heart: S1S2 no rubs  Abdomen:  Soft, nontender, bowel sounds present, PEG in place  Extremities: 2+ peripheral edema.  Neurologic: Not following commands  Skin: No rash  Access: Right internal jugular permcath    Basic Metabolic Panel: Recent Labs  Lab 03/30/19 0541 04/01/19 0444 04/04/19 0643  NA 132* 131* 130*  K 3.8 3.8 3.9  CL 96* 92* 92*  CO2 26 26 26   GLUCOSE 154* 161* 164*  BUN 75* 70* 91*  CREATININE 2.87* 2.72* 3.10*  CALCIUM 8.6* 8.7* 8.7*  PHOS 2.7 2.6 2.4*    Liver Function Tests: Recent Labs  Lab 03/30/19 0541 04/01/19 0444 04/04/19 0643  ALBUMIN 1.7* 1.8* 1.7*   No results for input(s): LIPASE, AMYLASE in the last 168 hours. No results for input(s): AMMONIA in the last 168 hours.  CBC: Recent Labs  Lab 03/30/19 0541 03/31/19 0456 04/01/19 0444 04/02/19 0538 04/04/19 0643  WBC 6.9 9.0 8.8 8.1 7.8  HGB 6.6* 8.3* 7.6* 7.6* 6.6*  HCT 20.3* 25.4* 22.7* 23.1* 20.1*  MCV 88.6 87.9 86.0 87.2 86.3  PLT 172 167 171 158 168    Cardiac Enzymes: No results for input(s): CKTOTAL, CKMB, CKMBINDEX, TROPONINI in the last 168 hours.  BNP: Invalid input(s): POCBNP  CBG: No results for input(s): GLUCAP in the last 168 hours.  Microbiology: Results for orders placed or performed during the hospital encounter of 02/15/19  Culture, Urine     Status: None   Collection Time: 02/15/19 12:52 AM   Specimen: Urine, Random  Result Value Ref Range Status   Specimen Description URINE,  RANDOM  Final   Special Requests NONE  Final   Culture   Final    NO GROWTH Performed at [facility], [place] [address] [place], [place] [address]    Report Status 02/17/2019 FINAL  Final  Culture, respiratory (non-expectorated)     Status: None   Collection Time: 02/15/19 10:50 PM   Specimen: Tracheal Aspirate; Respiratory  Result Value Ref Range Status   Specimen Description TRACHEAL ASPIRATE  Final   Special Requests NONE  Final   Gram Stain   Final    NO WBC SEEN RARE GRAM POSITIVE COCCI IN PAIRS Performed at [facility], [address] [place], [place] [address]    Culture MODERATE ENTEROCOCCUS FAECALIS  Final   Report Status 02/19/2019 FINAL  Final   Organism ID, Bacteria ENTEROCOCCUS FAECALIS  Final      Susceptibility   Enterococcus faecalis - MIC*    AMPICILLIN <=2 SENSITIVE Sensitive     VANCOMYCIN 1 SENSITIVE Sensitive     GENTAMICIN SYNERGY RESISTANT Resistant     * MODERATE ENTEROCOCCUS FAECALIS  SARS Coronavirus 2 by RT PCR (hospital order, performed in [place] hospital lab) Nasopharyngeal Nasopharyngeal Swab     Status: None   Collection Time: 02/16/19  2:20 PM   Specimen: Nasopharyngeal Swab  Result Value Ref Range Status   SARS Coronavirus 2 NEGATIVE NEGATIVE Final    Comment: (NOTE) If result is NEGATIVE SARS-CoV-2 target nucleic  acids are NOT DETECTED. The SARS-CoV-2 RNA is generally detectable in upper and lower  respiratory specimens during the acute phase of infection. The lowest  concentration of SARS-CoV-2 viral copies this assay can detect is 250  copies / mL. A negative result does not preclude SARS-CoV-2 infection  and should not be used as the sole basis for treatment or other  patient management decisions.  A negative result may occur with  improper specimen collection / handling, submission of specimen other  than nasopharyngeal swab, presence of viral mutation(s) within the  areas targeted by this assay, and inadequate number  of viral copies  (<250 copies / mL). A negative result must be combined with clinical  observations, patient history, and epidemiological information. If result is POSITIVE SARS-CoV-2 target nucleic acids are DETECTED. The SARS-CoV-2 RNA is generally detectable in upper and lower  respiratory specimens dur ing the acute phase of infection.  Positive  results are indicative of active infection with SARS-CoV-2.  Clinical  correlation with patient history and other diagnostic information is  necessary to determine patient infection status.  Positive results do  not rule out bacterial infection or co-infection with other viruses. If result is PRESUMPTIVE POSTIVE SARS-CoV-2 nucleic acids MAY BE PRESENT.   A presumptive positive result was obtained on the submitted specimen  and confirmed on repeat testing.  While 2019 novel coronavirus  (SARS-CoV-2) nucleic acids may be present in the submitted sample  additional confirmatory testing may be necessary for epidemiological  and / or clinical management purposes  to differentiate between  SARS-CoV-2 and other Sarbecovirus currently known to infect humans.  If clinically indicated additional testing with an alternate test  methodology [phone]) is advised. The SARS-CoV-2 RNA is generally  detectable in upper and lower respiratory sp ecimens during the acute  phase of infection. The expected result is Negative. Fact Sheet for Patients:  StrictlyIdeas.no Fact Sheet for Healthcare Providers: BankingDealers.co.za This test is not yet approved or cleared by the [place] FDA and has been authorized for detection and/or diagnosis of SARS-CoV-2 by FDA under an Emergency Use Authorization (EUA).  This EUA will remain in effect (meaning this test can be used) for the duration of the COVID-19 declaration under Section 564(b)(1) of the Act, 21 U.S.C. section 360bbb-3(b)(1), unless the authorization is  terminated or revoked sooner. Performed at [facility], [place] [address], [place], [place] [address]   SARS CORONAVIRUS 2 (TAT 6-24 HRS) Nasopharyngeal Nasopharyngeal Swab     Status: None   Collection Time: 03/07/19  7:40 AM   Specimen: Nasopharyngeal Swab  Result Value Ref Range Status   SARS Coronavirus 2 NEGATIVE NEGATIVE Final    Comment: (NOTE) SARS-CoV-2 target nucleic acids are NOT DETECTED. The SARS-CoV-2 RNA is generally detectable in upper and lower respiratory specimens during the acute phase of infection. Negative results do not preclude SARS-CoV-2 infection, do not rule out co-infections with other pathogens, and should not be used as the sole basis for treatment or other patient management decisions. Negative results must be combined with clinical observations, patient history, and epidemiological information. The expected result is Negative. Fact Sheet for Patients: SugarRoll.be Fact Sheet for Healthcare Providers: https://www.woods-mathews.com/ This test is not yet approved or cleared by the [place] FDA and  has been authorized for detection and/or diagnosis of SARS-CoV-2 by FDA under an Emergency Use Authorization (EUA). This EUA will remain  in effect (meaning this test can be used) for the duration of the COVID-19 declaration under  Section 56 4(b)(1) of the Act, 21 U.S.C. section 360bbb-3(b)(1), unless the authorization is terminated or revoked sooner. Performed at [facility], [place] [address] [place], [place] [address]   Stat Gram stain     Status: None   Collection Time: 03/11/19 12:46 PM   Specimen: Pleura; Body Fluid  Result Value Ref Range Status   Specimen Description PLEURAL LEFT  Final   Special Requests Normal  Final   Gram Stain   Final    NO WBC SEEN NO ORGANISMS SEEN Performed at [facility], [address], [place], [place] [address]    Report Status 03/11/2019 FINAL  Final   Gram stain     Status: None   Collection Time: 03/12/19 10:37 AM   Specimen: PATH Cytology Pleural fluid  Result Value Ref Range Status   Specimen Description PLEURAL RIGHT  Final   Special Requests NONE  Final   Gram Stain   Final    RARE WBC PRESENT, PREDOMINANTLY MONONUCLEAR NO ORGANISMS SEEN Performed at [facility], [place] [address], [place], [place] [address]    Report Status 03/12/2019 FINAL  Final  Culture, body fluid-bottle     Status: None   Collection Time: 03/12/19 10:37 AM   Specimen: Pleura  Result Value Ref Range Status   Specimen Description PLEURAL RIGHT  Final   Special Requests NONE  Final   Culture   Final    NO GROWTH 5 DAYS Performed at [place] [address], [place],  [address]    Report Status 03/17/2019 FINAL  Final    Coagulation Studies: No results for input(s): LABPROT, INR in the last 72 hours.  Urinalysis: No results for input(s): COLORURINE, LABSPEC, PHURINE, GLUCOSEU, HGBUR, BILIRUBINUR, KETONESUR, PROTEINUR, UROBILINOGEN, NITRITE, LEUKOCYTESUR in the last 72 hours.  Invalid input(s): APPERANCEUR    Imaging: No results found.   Medications:     iohexol  Assessment/ Plan:  73 y.o. male with a PMHx of BPH, allergic rhinitis, COPD, diabetes mellitus type 2, hypertension, GERD, acute respiratory failure, congestive heart failure, chronic kidney disease stage IIIb EGFR 43 who was admitted to [facility] on 02/15/2019 for ongoing management of acute respiratory failure.   1.  Acute kidney injury/chronic kidney disease stage IIIb baseline GFR 43.    Patient remains dialysis dependent at this time.  He is due for dialysis treatment today.  We will schedule dialysis treatment for today.  2.  Acute respiratory failure.  Patient has been very difficult to wean from the ventilator.  Continue ventilatory support at this time.  3.  Anemia of chronic kidney disease.  Hemoglobin has drifted down again over the weekend down to  6.6.  Consider blood transfusion but defer to primary team.  4.  Secondary hyperparathyroidism.  Phosphorus currently 2.4.  Continue to monitor bone mineral metabolism parameters. LOS: 0 Magie Ciampa 11/30/20208:32 AM

## 2019-04-05 DIAGNOSIS — N179 Acute kidney failure, unspecified: Secondary | ICD-10-CM | POA: Diagnosis not present

## 2019-04-05 DIAGNOSIS — I5043 Acute on chronic combined systolic (congestive) and diastolic (congestive) heart failure: Secondary | ICD-10-CM | POA: Diagnosis not present

## 2019-04-05 DIAGNOSIS — J449 Chronic obstructive pulmonary disease, unspecified: Secondary | ICD-10-CM | POA: Diagnosis not present

## 2019-04-05 DIAGNOSIS — J9621 Acute and chronic respiratory failure with hypoxia: Secondary | ICD-10-CM | POA: Diagnosis not present

## 2019-04-05 LAB — TYPE AND SCREEN
ABO/RH(D): B POS
Antibody Screen: NEGATIVE
Unit division: 0

## 2019-04-05 LAB — BPAM RBC
Blood Product Expiration Date: 202012232359
ISSUE DATE / TIME: 202011301224
Unit Type and Rh: 7300

## 2019-04-05 LAB — CBC
HCT: 23.8 % — ABNORMAL LOW (ref 39.0–52.0)
Hemoglobin: 7.9 g/dL — ABNORMAL LOW (ref 13.0–17.0)
MCH: 28.5 pg (ref 26.0–34.0)
MCHC: 33.2 g/dL (ref 30.0–36.0)
MCV: 85.9 fL (ref 80.0–100.0)
Platelets: 181 10*3/uL (ref 150–400)
RBC: 2.77 MIL/uL — ABNORMAL LOW (ref 4.22–5.81)
RDW: 15.3 % (ref 11.5–15.5)
WBC: 8.8 10*3/uL (ref 4.0–10.5)
nRBC: 0 % (ref 0.0–0.2)

## 2019-04-05 NOTE — Progress Notes (Signed)
Pulmonary Critical Care Medicine [place]   PULMONARY CRITICAL CARE SERVICE  PROGRESS NOTE  Date of Service: 04/05/2019  Paul Ball  [id number]  DOB: Oct 05, 1945   DOA: 02/15/2019  Referring Physician: [name], MD  HPI: Paul Ball is a 73 y.o. male seen for follow up of Acute on Chronic Respiratory Failure.  Remains at baseline patient's not been tolerating any weaning attempts.  Remains on assist control mode currently is on 30% FiO2  Medications: Reviewed on Rounds  Physical Exam:  Vitals: Temperature 98.7 pulse 96 respiratory rate 20 blood pressure 153/58 saturations 100%  Ventilator Settings mode ventilation assist control FiO2 30% tidal volume 420 PEEP 5  . General: Comfortable at this time . Eyes: Grossly normal lids, irises & conjunctiva . ENT: grossly tongue is normal . Neck: no obvious mass . Cardiovascular: S1 S2 normal no gallop . Respiratory: No rhonchi no rales are noted at this time . Abdomen: soft . Skin: no rash seen on limited exam . Musculoskeletal: not rigid . Psychiatric:unable to assess . Neurologic: no seizure no involuntary movements         Lab Data:   Basic Metabolic Panel: Recent Labs  Lab 03/30/19 0541 04/01/19 0444 04/04/19 0643  NA 132* 131* 130*  K 3.8 3.8 3.9  CL 96* 92* 92*  CO2 26 26 26   GLUCOSE 154* 161* 164*  BUN 75* 70* 91*  CREATININE 2.87* 2.72* 3.10*  CALCIUM 8.6* 8.7* 8.7*  PHOS 2.7 2.6 2.4*    ABG: No results for input(s): PHART, PCO2ART, PO2ART, HCO3, O2SAT in the last 168 hours.  Liver Function Tests: Recent Labs  Lab 03/30/19 0541 04/01/19 0444 04/04/19 0643  ALBUMIN 1.7* 1.8* 1.7*   No results for input(s): LIPASE, AMYLASE in the last 168 hours. No results for input(s): AMMONIA in the last 168 hours.  CBC: Recent Labs  Lab 03/30/19 0541 03/31/19 0456 04/01/19 0444 04/02/19 0538 04/04/19 0643  WBC 6.9 9.0 8.8 8.1 7.8  HGB 6.6* 8.3* 7.6* 7.6* 6.6*  HCT 20.3* 25.4*  22.7* 23.1* 20.1*  MCV 88.6 87.9 86.0 87.2 86.3  PLT 172 167 171 158 168    Cardiac Enzymes: No results for input(s): CKTOTAL, CKMB, CKMBINDEX, TROPONINI in the last 168 hours.  BNP (last 3 results) No results for input(s): BNP in the last 8760 hours.  ProBNP (last 3 results) No results for input(s): PROBNP in the last 8760 hours.  Radiological Exams: No results found.  Assessment/Plan Active Problems:   Acute on chronic respiratory failure with hypoxia (HCC)   Acute on chronic systolic and diastolic heart failure, NYHA class 3 (HCC)   Acute renal failure superimposed on stage 3 chronic kidney disease (HCC)   Healthcare-associated pneumonia   COPD, severe ([place])   1. Acute on chronic respiratory failure hypoxia patient has failed numerous times we will continue to assess however the weaning parameters continue with supportive care 2. Acute on chronic systolic and diastolic heart failure at baseline we will continue supportive care 3. Acute renal failure followed by nephrology for dialysis 4. Healthcare associated pneumonia treated 5. Severe COPD nebs as necessary   I have personally seen and evaluated the patient, evaluated laboratory and imaging results, formulated the assessment and plan and placed orders. The Patient requires high complexity decision making for assessment and support.  Case was discussed on Rounds with the Respiratory Therapy Staff  Allyne Gee, MD [facility] Pulmonary Critical Care Medicine Sleep Medicine

## 2019-04-06 ENCOUNTER — Other Ambulatory Visit (HOSPITAL_COMMUNITY): Payer: Medicare Other

## 2019-04-06 DIAGNOSIS — J9621 Acute and chronic respiratory failure with hypoxia: Secondary | ICD-10-CM | POA: Diagnosis not present

## 2019-04-06 DIAGNOSIS — N179 Acute kidney failure, unspecified: Secondary | ICD-10-CM | POA: Diagnosis not present

## 2019-04-06 DIAGNOSIS — I5043 Acute on chronic combined systolic (congestive) and diastolic (congestive) heart failure: Secondary | ICD-10-CM | POA: Diagnosis not present

## 2019-04-06 DIAGNOSIS — J449 Chronic obstructive pulmonary disease, unspecified: Secondary | ICD-10-CM | POA: Diagnosis not present

## 2019-04-06 LAB — RENAL FUNCTION PANEL
Albumin: 1.7 g/dL — ABNORMAL LOW (ref 3.5–5.0)
Anion gap: 11 (ref 5–15)
BUN: 77 mg/dL — ABNORMAL HIGH (ref 8–23)
CO2: 27 mmol/L (ref 22–32)
Calcium: 8.9 mg/dL (ref 8.9–10.3)
Chloride: 95 mmol/L — ABNORMAL LOW (ref 98–111)
Creatinine, Ser: 2.78 mg/dL — ABNORMAL HIGH (ref 0.61–1.24)
GFR calc Af Amer: 25 mL/min — ABNORMAL LOW (ref >60)
GFR calc non Af Amer: 22 mL/min — ABNORMAL LOW (ref >60)
Glucose, Bld: 163 mg/dL — ABNORMAL HIGH (ref 70–99)
Phosphorus: 2.8 mg/dL (ref 2.5–4.6)
Potassium: 4 mmol/L (ref 3.5–5.1)
Sodium: 133 mmol/L — ABNORMAL LOW (ref 135–145)

## 2019-04-06 LAB — CBC
HCT: 24.8 % — ABNORMAL LOW (ref 39.0–52.0)
Hemoglobin: 8.2 g/dL — ABNORMAL LOW (ref 13.0–17.0)
MCH: 28.5 pg (ref 26.0–34.0)
MCHC: 33.1 g/dL (ref 30.0–36.0)
MCV: 86.1 fL (ref 80.0–100.0)
Platelets: 175 10*3/uL (ref 150–400)
RBC: 2.88 MIL/uL — ABNORMAL LOW (ref 4.22–5.81)
RDW: 15.1 % (ref 11.5–15.5)
WBC: 7.7 10*3/uL (ref 4.0–10.5)
nRBC: 0 % (ref 0.0–0.2)

## 2019-04-06 NOTE — Progress Notes (Signed)
Pulmonary Critical Care Medicine [place]   PULMONARY CRITICAL CARE SERVICE  PROGRESS NOTE  Date of Service: 04/06/2019  Paul Ball  [id number]  DOB: 28-Feb-1946   DOA: 02/15/2019  Referring Physician: [name], MD  HPI: Paul Ball is a 73 y.o. male seen for follow up of Acute on Chronic Respiratory Failure.  Patient currently is on full support on the ventilator has not been tolerating any weaning attempts remains on assist control mode  Medications: Reviewed on Rounds  Physical Exam:  Vitals: Temperature 97.9 pulse 79 respiratory 18 blood pressure 100/53 saturations 96%  Ventilator Settings mode ventilation assist control FiO2 is 30% tidal volume is 549 PEEP 5  . General: Comfortable at this time . Eyes: Grossly normal lids, irises & conjunctiva . ENT: grossly tongue is normal . Neck: no obvious mass . Cardiovascular: S1 S2 normal no gallop . Respiratory: No rhonchi no rales are noted at this time . Abdomen: soft . Skin: no rash seen on limited exam . Musculoskeletal: not rigid . Psychiatric:unable to assess . Neurologic: no seizure no involuntary movements         Lab Data:   Basic Metabolic Panel: Recent Labs  Lab 04/01/19 0444 04/04/19 0643 04/06/19 0537  NA 131* 130* 133*  K 3.8 3.9 4.0  CL 92* 92* 95*  CO2 26 26 27   GLUCOSE 161* 164* 163*  BUN 70* 91* 77*  CREATININE 2.72* 3.10* 2.78*  CALCIUM 8.7* 8.7* 8.9  PHOS 2.6 2.4* 2.8    ABG: No results for input(s): PHART, PCO2ART, PO2ART, HCO3, O2SAT in the last 168 hours.  Liver Function Tests: Recent Labs  Lab 04/01/19 0444 04/04/19 0643 04/06/19 0537  ALBUMIN 1.8* 1.7* 1.7*   No results for input(s): LIPASE, AMYLASE in the last 168 hours. No results for input(s): AMMONIA in the last 168 hours.  CBC: Recent Labs  Lab 04/01/19 0444 04/02/19 0538 04/04/19 0643 04/05/19 1047 04/06/19 0537  WBC 8.8 8.1 7.8 8.8 7.7  HGB 7.6* 7.6* 6.6* 7.9* 8.2*  HCT 22.7*  23.1* 20.1* 23.8* 24.8*  MCV 86.0 87.2 86.3 85.9 86.1  PLT 171 158 168 181 175    Cardiac Enzymes: No results for input(s): CKTOTAL, CKMB, CKMBINDEX, TROPONINI in the last 168 hours.  BNP (last 3 results) No results for input(s): BNP in the last 8760 hours.  ProBNP (last 3 results) No results for input(s): PROBNP in the last 8760 hours.  Radiological Exams: Dg Chest Port 1 View  Result Date: 04/06/2019 CLINICAL DATA:  Pleural effusion. Acute on chronic respiratory failure. EXAM: PORTABLE CHEST 1 VIEW COMPARISON:  One-view chest x-ray 03/25/2019 FINDINGS: The heart is enlarged. Tracheostomy tube is stable. Renal dialysis catheter is stable. Diffuse interstitial and airspace disease is similar to slightly worse compared to the prior exam. Bilateral effusions are present, left greater than right. There is no pneumothorax. Visualized soft tissues and bony thorax are unremarkable. IMPRESSION: 1. Stable cardiomegaly. 2. Stable bilateral pleural effusions, left greater than right. 3. Slight increase in diffuse interstitial and airspace disease, likely edema. Electronically Signed   By: San Morelle M.D.   On: 04/06/2019 06:14    Assessment/Plan Active Problems:   Acute on chronic respiratory failure with hypoxia (HCC)   Acute on chronic systolic and diastolic heart failure, NYHA class 3 (HCC)   Acute renal failure superimposed on stage 3 chronic kidney disease (HCC)   Healthcare-associated pneumonia   COPD, severe ([place])   1. Acute on chronic respiratory failure hypoxia plan  continue with full support on assist control mode right now on 30% FiO2 with a PEEP of 5 2. Acute on chronic diastolic heart failure no change still has significant cardiomegaly and pleural pleural effusions continue with fluid management per nephrology 3. Acute renal failure per nephrology on dialysis 4. Healthcare associated pneumonia treated 5. Severe COPD at baseline   I have personally seen and evaluated  the patient, evaluated laboratory and imaging results, formulated the assessment and plan and placed orders. The Patient requires high complexity decision making for assessment and support.  Case was discussed on Rounds with the Respiratory Therapy Staff  Allyne Gee, MD [facility] Pulmonary Critical Care Medicine Sleep Medicine

## 2019-04-06 NOTE — Progress Notes (Signed)
[facility]  ROUNDING NOTE   Subjective:  Patient remains critically ill. Still on the ventilator. He remains dialysis dependent.   Wound healing  Vital signs in last 24 hours:  Temperature 96.6 pulse 79 respirations 18 blood pressure 100/53  Physical Exam: General: Critically ill-appearing  Head: Normocephalic, atraumatic. Moist oral mucosal membranes  Eyes: Anicteric  Neck: Tracheostomy in place  Lungs:  Scattered rhonchi, vent assisted  Heart: S1S2 no rubs  Abdomen:  Soft, nontender, bowel sounds present, PEG in place  Extremities: 2+ peripheral edema.  Neurologic: Not following commands  Skin: No rash  Access: Right internal jugular permcath    Basic Metabolic Panel: Recent Labs  Lab 04/01/19 0444 04/04/19 0643 04/06/19 0537  NA 131* 130* 133*  K 3.8 3.9 4.0  CL 92* 92* 95*  CO2 26 26 27   GLUCOSE 161* 164* 163*  BUN 70* 91* 77*  CREATININE 2.72* 3.10* 2.78*  CALCIUM 8.7* 8.7* 8.9  PHOS 2.6 2.4* 2.8    Liver Function Tests: Recent Labs  Lab 04/01/19 0444 04/04/19 0643 04/06/19 0537  ALBUMIN 1.8* 1.7* 1.7*   No results for input(s): LIPASE, AMYLASE in the last 168 hours. No results for input(s): AMMONIA in the last 168 hours.  CBC: Recent Labs  Lab 04/01/19 0444 04/02/19 0538 04/04/19 0643 04/05/19 1047 04/06/19 0537  WBC 8.8 8.1 7.8 8.8 7.7  HGB 7.6* 7.6* 6.6* 7.9* 8.2*  HCT 22.7* 23.1* 20.1* 23.8* 24.8*  MCV 86.0 87.2 86.3 85.9 86.1  PLT 171 158 168 181 175    Cardiac Enzymes: No results for input(s): CKTOTAL, CKMB, CKMBINDEX, TROPONINI in the last 168 hours.  BNP: Invalid input(s): POCBNP  CBG: No results for input(s): GLUCAP in the last 168 hours.  Microbiology: Results for orders placed or performed during the hospital encounter of 02/15/19  Culture, Urine     Status: None   Collection Time: 02/15/19 12:52 AM   Specimen: Urine, Random  Result Value Ref Range Status   Specimen Description URINE, RANDOM  Final    Special Requests NONE  Final   Culture   Final    NO GROWTH Performed at [facility], [place] [address] [place], [place] [address]    Report Status 02/17/2019 FINAL  Final  Culture, respiratory (non-expectorated)     Status: None   Collection Time: 02/15/19 10:50 PM   Specimen: Tracheal Aspirate; Respiratory  Result Value Ref Range Status   Specimen Description TRACHEAL ASPIRATE  Final   Special Requests NONE  Final   Gram Stain   Final    NO WBC SEEN RARE GRAM POSITIVE COCCI IN PAIRS Performed at [facility], [address] [place], [place] [address]    Culture MODERATE ENTEROCOCCUS FAECALIS  Final   Report Status 02/19/2019 FINAL  Final   Organism ID, Bacteria ENTEROCOCCUS FAECALIS  Final      Susceptibility   Enterococcus faecalis - MIC*    AMPICILLIN <=2 SENSITIVE Sensitive     VANCOMYCIN 1 SENSITIVE Sensitive     GENTAMICIN SYNERGY RESISTANT Resistant     * MODERATE ENTEROCOCCUS FAECALIS  SARS Coronavirus 2 by RT PCR (hospital order, performed in [place] hospital lab) Nasopharyngeal Nasopharyngeal Swab     Status: None   Collection Time: 02/16/19  2:20 PM   Specimen: Nasopharyngeal Swab  Result Value Ref Range Status   SARS Coronavirus 2 NEGATIVE NEGATIVE Final    Comment: (NOTE) If result is NEGATIVE SARS-CoV-2 target nucleic acids are NOT  DETECTED. The SARS-CoV-2 RNA is generally detectable in upper and lower  respiratory specimens during the acute phase of infection. The lowest  concentration of SARS-CoV-2 viral copies this assay can detect is 250  copies / mL. A negative result does not preclude SARS-CoV-2 infection  and should not be used as the sole basis for treatment or other  patient management decisions.  A negative result may occur with  improper specimen collection / handling, submission of specimen other  than nasopharyngeal swab, presence of viral mutation(s) within the  areas targeted by this assay, and inadequate number of viral copies   (<250 copies / mL). A negative result must be combined with clinical  observations, patient history, and epidemiological information. If result is POSITIVE SARS-CoV-2 target nucleic acids are DETECTED. The SARS-CoV-2 RNA is generally detectable in upper and lower  respiratory specimens dur ing the acute phase of infection.  Positive  results are indicative of active infection with SARS-CoV-2.  Clinical  correlation with patient history and other diagnostic information is  necessary to determine patient infection status.  Positive results do  not rule out bacterial infection or co-infection with other viruses. If result is PRESUMPTIVE POSTIVE SARS-CoV-2 nucleic acids MAY BE PRESENT.   A presumptive positive result was obtained on the submitted specimen  and confirmed on repeat testing.  While 2019 novel coronavirus  (SARS-CoV-2) nucleic acids may be present in the submitted sample  additional confirmatory testing may be necessary for epidemiological  and / or clinical management purposes  to differentiate between  SARS-CoV-2 and other Sarbecovirus currently known to infect humans.  If clinically indicated additional testing with an alternate test  methodology [id number]) is advised. The SARS-CoV-2 RNA is generally  detectable in upper and lower respiratory sp ecimens during the acute  phase of infection. The expected result is Negative. Fact Sheet for Patients:  StrictlyIdeas.no Fact Sheet for Healthcare Providers: BankingDealers.co.za This test is not yet approved or cleared by the [place] FDA and has been authorized for detection and/or diagnosis of SARS-CoV-2 by FDA under an Emergency Use Authorization (EUA).  This EUA will remain in effect (meaning this test can be used) for the duration of the COVID-19 declaration under Section 564(b)(1) of the Act, 21 U.S.C. section 360bbb-3(b)(1), unless the authorization is terminated  or revoked sooner. Performed at [facility], [place] [address] [place], [place] [address]   SARS CORONAVIRUS 2 (TAT 6-24 HRS) Nasopharyngeal Nasopharyngeal Swab     Status: None   Collection Time: 03/07/19  7:40 AM   Specimen: Nasopharyngeal Swab  Result Value Ref Range Status   SARS Coronavirus 2 NEGATIVE NEGATIVE Final    Comment: (NOTE) SARS-CoV-2 target nucleic acids are NOT DETECTED. The SARS-CoV-2 RNA is generally detectable in upper and lower respiratory specimens during the acute phase of infection. Negative results do not preclude SARS-CoV-2 infection, do not rule out co-infections with other pathogens, and should not be used as the sole basis for treatment or other patient management decisions. Negative results must be combined with clinical observations, patient history, and epidemiological information. The expected result is Negative. Fact Sheet for Patients: SugarRoll.be Fact Sheet for Healthcare Providers: https://www.woods-mathews.com/ This test is not yet approved or cleared by the [place] FDA and  has been authorized for detection and/or diagnosis of SARS-CoV-2 by FDA under an Emergency Use Authorization (EUA). This EUA will remain  in effect (meaning this test can be used) for the duration of the COVID-19 declaration under Section 56 4(b)(1)  of the Act, 21 U.S.C. section 360bbb-3(b)(1), unless the authorization is terminated or revoked sooner. Performed at [facility], [place] [address], [place], [place] [address]   Stat Gram stain     Status: None   Collection Time: 03/11/19 12:46 PM   Specimen: Pleura; Body Fluid  Result Value Ref Range Status   Specimen Description PLEURAL LEFT  Final   Special Requests Normal  Final   Gram Stain   Final    NO WBC SEEN NO ORGANISMS SEEN Performed at [facility], [address], [place], [place] [address]    Report Status 03/11/2019 FINAL  Final  Gram  stain     Status: None   Collection Time: 03/12/19 10:37 AM   Specimen: PATH Cytology Pleural fluid  Result Value Ref Range Status   Specimen Description PLEURAL RIGHT  Final   Special Requests NONE  Final   Gram Stain   Final    RARE WBC PRESENT, PREDOMINANTLY MONONUCLEAR NO ORGANISMS SEEN Performed at [facility], [place] [address], [place], [place] [address]    Report Status 03/12/2019 FINAL  Final  Culture, body fluid-bottle     Status: None   Collection Time: 03/12/19 10:37 AM   Specimen: Pleura  Result Value Ref Range Status   Specimen Description PLEURAL RIGHT  Final   Special Requests NONE  Final   Culture   Final    NO GROWTH 5 DAYS Performed at [place] [address], [place], [place] [address]    Report Status 03/17/2019 FINAL  Final    Coagulation Studies: No results for input(s): LABPROT, INR in the last 72 hours.  Urinalysis: No results for input(s): COLORURINE, LABSPEC, PHURINE, GLUCOSEU, HGBUR, BILIRUBINUR, KETONESUR, PROTEINUR, UROBILINOGEN, NITRITE, LEUKOCYTESUR in the last 72 hours.  Invalid input(s): APPERANCEUR    Imaging: Dg Chest Port 1 View  Result Date: 04/06/2019 CLINICAL DATA:  Pleural effusion. Acute on chronic respiratory failure. EXAM: PORTABLE CHEST 1 VIEW COMPARISON:  One-view chest x-ray 03/25/2019 FINDINGS: The heart is enlarged. Tracheostomy tube is stable. Renal dialysis catheter is stable. Diffuse interstitial and airspace disease is similar to slightly worse compared to the prior exam. Bilateral effusions are present, left greater than right. There is no pneumothorax. Visualized soft tissues and bony thorax are unremarkable. IMPRESSION: 1. Stable cardiomegaly. 2. Stable bilateral pleural effusions, left greater than right. 3. Slight increase in diffuse interstitial and airspace disease, likely edema. Electronically Signed   By: San Morelle M.D.   On: 04/06/2019 06:14     Medications:     iohexol  Assessment/  Plan:  73 y.o. male with a PMHx of BPH, allergic rhinitis, COPD, diabetes mellitus type 2, hypertension, GERD, acute respiratory failure, congestive heart failure, chronic kidney disease stage IIIb EGFR 43 who was admitted to [facility] on 02/15/2019 for ongoing management of acute respiratory failure.   1.    End-stage renal disease.  The patient remains dialysis dependent and has been so for quite some time over this hospitalization.  Therefore at this time he is declared end-stage renal disease.  We will plan for hemodialysis treatment today.  2.  Acute respiratory failure.  Patient also remains ventilatory dependent at this time as well.  Long-term vent facility being considered.  3.  Anemia of chronic kidney disease.  Hemoglobin currently 8.2 posttransfusion.  Continue to monitor.  4.  Secondary hyperparathyroidism.  Phosphorus up to 2.8 and improved as compared to last check. LOS: 0 Tasfia Vasseur  Robey Massmann 12/2/202010:48 AM

## 2019-04-07 DIAGNOSIS — I5043 Acute on chronic combined systolic (congestive) and diastolic (congestive) heart failure: Secondary | ICD-10-CM | POA: Diagnosis not present

## 2019-04-07 DIAGNOSIS — J9621 Acute and chronic respiratory failure with hypoxia: Secondary | ICD-10-CM | POA: Diagnosis not present

## 2019-04-07 DIAGNOSIS — J449 Chronic obstructive pulmonary disease, unspecified: Secondary | ICD-10-CM | POA: Diagnosis not present

## 2019-04-07 DIAGNOSIS — N179 Acute kidney failure, unspecified: Secondary | ICD-10-CM | POA: Diagnosis not present

## 2019-04-07 NOTE — Progress Notes (Signed)
Pulmonary Critical Care Medicine [place]   PULMONARY CRITICAL CARE SERVICE  PROGRESS NOTE  Date of Service: 04/07/2019  Paul Ball  [id number]  DOB: Mar 14, 1946   DOA: 02/15/2019  Referring Physician: [name], MD  HPI: Paul Ball is a 73 y.o. male seen for follow up of Acute on Chronic Respiratory Failure.  No improvement noted remains on the ventilator has been failing any attempts at weaning so far  Medications: Reviewed on Rounds  Physical Exam:  Vitals: Temperature 98.9 pulse 88 respiratory rate 35 blood pressure 128/53 saturations 100%  Ventilator Settings mode ventilation assist control FiO2 30% tidal line 420 PEEP 5  . General: Comfortable at this time . Eyes: Grossly normal lids, irises & conjunctiva . ENT: grossly tongue is normal . Neck: no obvious mass . Cardiovascular: S1 S2 normal no gallop . Respiratory: No rhonchi no rales are noted at this time . Abdomen: soft . Skin: no rash seen on limited exam . Musculoskeletal: not rigid . Psychiatric:unable to assess . Neurologic: no seizure no involuntary movements         Lab Data:   Basic Metabolic Panel: Recent Labs  Lab 04/01/19 0444 04/04/19 0643 04/06/19 0537  NA 131* 130* 133*  K 3.8 3.9 4.0  CL 92* 92* 95*  CO2 26 26 27   GLUCOSE 161* 164* 163*  BUN 70* 91* 77*  CREATININE 2.72* 3.10* 2.78*  CALCIUM 8.7* 8.7* 8.9  PHOS 2.6 2.4* 2.8    ABG: No results for input(s): PHART, PCO2ART, PO2ART, HCO3, O2SAT in the last 168 hours.  Liver Function Tests: Recent Labs  Lab 04/01/19 0444 04/04/19 0643 04/06/19 0537  ALBUMIN 1.8* 1.7* 1.7*   No results for input(s): LIPASE, AMYLASE in the last 168 hours. No results for input(s): AMMONIA in the last 168 hours.  CBC: Recent Labs  Lab 04/01/19 0444 04/02/19 0538 04/04/19 0643 04/05/19 1047 04/06/19 0537  WBC 8.8 8.1 7.8 8.8 7.7  HGB 7.6* 7.6* 6.6* 7.9* 8.2*  HCT 22.7* 23.1* 20.1* 23.8* 24.8*  MCV 86.0 87.2  86.3 85.9 86.1  PLT 171 158 168 181 175    Cardiac Enzymes: No results for input(s): CKTOTAL, CKMB, CKMBINDEX, TROPONINI in the last 168 hours.  BNP (last 3 results) No results for input(s): BNP in the last 8760 hours.  ProBNP (last 3 results) No results for input(s): PROBNP in the last 8760 hours.  Radiological Exams: Dg Chest Port 1 View  Result Date: 04/06/2019 CLINICAL DATA:  Pleural effusion. Acute on chronic respiratory failure. EXAM: PORTABLE CHEST 1 VIEW COMPARISON:  One-view chest x-ray 03/25/2019 FINDINGS: The heart is enlarged. Tracheostomy tube is stable. Renal dialysis catheter is stable. Diffuse interstitial and airspace disease is similar to slightly worse compared to the prior exam. Bilateral effusions are present, left greater than right. There is no pneumothorax. Visualized soft tissues and bony thorax are unremarkable. IMPRESSION: 1. Stable cardiomegaly. 2. Stable bilateral pleural effusions, left greater than right. 3. Slight increase in diffuse interstitial and airspace disease, likely edema. Electronically Signed   By: San Morelle M.D.   On: 04/06/2019 06:14    Assessment/Plan Active Problems:   Acute on chronic respiratory failure with hypoxia (HCC)   Acute on chronic systolic and diastolic heart failure, NYHA class 3 (HCC)   Acute renal failure superimposed on stage 3 chronic kidney disease (HCC)   Healthcare-associated pneumonia   COPD, severe ([place])   1. Acute on chronic respiratory failure with hypoxia plan is to continue with full  support on the ventilator patient has been failing attempts at weaning 2. Acute on chronic systolic heart failure continue present management last chest x-ray showed increased interstitial prominence needs more fluid removed 3. Acute renal failure on chronic renal failure followed by nephrology for dialysis 4. Healthcare associated pneumonia treated we will continue with supportive care 5. Severe COPD at baseline   I  have personally seen and evaluated the patient, evaluated laboratory and imaging results, formulated the assessment and plan and placed orders. The Patient requires high complexity decision making for assessment and support.  Case was discussed on Rounds with the Respiratory Therapy Staff  Allyne Gee, MD [facility] Pulmonary Critical Care Medicine Sleep Medicine

## 2019-04-08 DIAGNOSIS — J449 Chronic obstructive pulmonary disease, unspecified: Secondary | ICD-10-CM | POA: Diagnosis not present

## 2019-04-08 DIAGNOSIS — N179 Acute kidney failure, unspecified: Secondary | ICD-10-CM | POA: Diagnosis not present

## 2019-04-08 DIAGNOSIS — I5043 Acute on chronic combined systolic (congestive) and diastolic (congestive) heart failure: Secondary | ICD-10-CM | POA: Diagnosis not present

## 2019-04-08 DIAGNOSIS — J9621 Acute and chronic respiratory failure with hypoxia: Secondary | ICD-10-CM | POA: Diagnosis not present

## 2019-04-08 LAB — RENAL FUNCTION PANEL
Albumin: 1.6 g/dL — ABNORMAL LOW (ref 3.5–5.0)
Anion gap: 12 (ref 5–15)
BUN: 76 mg/dL — ABNORMAL HIGH (ref 8–23)
CO2: 25 mmol/L (ref 22–32)
Calcium: 8.7 mg/dL — ABNORMAL LOW (ref 8.9–10.3)
Chloride: 95 mmol/L — ABNORMAL LOW (ref 98–111)
Creatinine, Ser: 2.71 mg/dL — ABNORMAL HIGH (ref 0.61–1.24)
GFR calc Af Amer: 26 mL/min — ABNORMAL LOW (ref >60)
GFR calc non Af Amer: 22 mL/min — ABNORMAL LOW (ref >60)
Glucose, Bld: 174 mg/dL — ABNORMAL HIGH (ref 70–99)
Phosphorus: 2.5 mg/dL (ref 2.5–4.6)
Potassium: 4.3 mmol/L (ref 3.5–5.1)
Sodium: 132 mmol/L — ABNORMAL LOW (ref 135–145)

## 2019-04-08 LAB — CBC
HCT: 23.5 % — ABNORMAL LOW (ref 39.0–52.0)
Hemoglobin: 7.8 g/dL — ABNORMAL LOW (ref 13.0–17.0)
MCH: 28.6 pg (ref 26.0–34.0)
MCHC: 33.2 g/dL (ref 30.0–36.0)
MCV: 86.1 fL (ref 80.0–100.0)
Platelets: 191 10*3/uL (ref 150–400)
RBC: 2.73 MIL/uL — ABNORMAL LOW (ref 4.22–5.81)
RDW: 14.8 % (ref 11.5–15.5)
WBC: 8.1 10*3/uL (ref 4.0–10.5)
nRBC: 0 % (ref 0.0–0.2)

## 2019-04-08 LAB — MAGNESIUM: Magnesium: 2 mg/dL (ref 1.7–2.4)

## 2019-04-08 NOTE — Progress Notes (Signed)
Pulmonary Critical Care Medicine [place] [facility]   PULMONARY CRITICAL CARE SERVICE  PROGRESS NOTE  Date of Service: 04/08/2019  Paul Ball  [id number]  DOB: 25-Jan-1946   DOA: 02/15/2019  Referring Physician: [name], MD  HPI: Paul Ball is a 73 y.o. male seen for follow up of Acute on Chronic Respiratory Failure.  Patient continues on full support on assist control mode has been on 30% FiO2 continues to fail any attempt at weaning.  Medications: Reviewed on Rounds  Physical Exam:  Vitals: Temperature 96.9 pulse 78 respiratory rate 29 blood pressure 113/49 saturations 100%  Ventilator Settings mode ventilation assist control FiO2 30% tidal line 511 PEEP 5  . General: Comfortable at this time . Eyes: Grossly normal lids, irises & conjunctiva . ENT: grossly tongue is normal . Neck: no obvious mass . Cardiovascular: S1 S2 normal no gallop . Respiratory: No rhonchi coarse breath sounds are noted . Abdomen: soft . Skin: no rash seen on limited exam . Musculoskeletal: not rigid . Psychiatric:unable to assess . Neurologic: no seizure no involuntary movements         Lab Data:   Basic Metabolic Panel: Recent Labs  Lab 04/04/19 0643 04/06/19 0537 04/08/19 0647  NA 130* 133* 132*  K 3.9 4.0 4.3  CL 92* 95* 95*  CO2 26 27 25   GLUCOSE 164* 163* 174*  BUN 91* 77* 76*  CREATININE 3.10* 2.78* 2.71*  CALCIUM 8.7* 8.9 8.7*  PHOS 2.4* 2.8 2.5    ABG: No results for input(s): PHART, PCO2ART, PO2ART, HCO3, O2SAT in the last 168 hours.  Liver Function Tests: Recent Labs  Lab 04/04/19 0643 04/06/19 0537 04/08/19 0647  ALBUMIN 1.7* 1.7* 1.6*   No results for input(s): LIPASE, AMYLASE in the last 168 hours. No results for input(s): AMMONIA in the last 168 hours.  CBC: Recent Labs  Lab 04/02/19 0538 04/04/19 0643 04/05/19 1047 04/06/19 0537 04/08/19 0647  WBC 8.1 7.8 8.8 7.7 8.1  HGB 7.6* 6.6* 7.9* 8.2* 7.8*  HCT 23.1* 20.1* 23.8* 24.8*  23.5*  MCV 87.2 86.3 85.9 86.1 86.1  PLT 158 168 181 175 191    Cardiac Enzymes: No results for input(s): CKTOTAL, CKMB, CKMBINDEX, TROPONINI in the last 168 hours.  BNP (last 3 results) No results for input(s): BNP in the last 8760 hours.  ProBNP (last 3 results) No results for input(s): PROBNP in the last 8760 hours.  Radiological Exams: No results found.  Assessment/Plan Active Problems:   Acute on chronic respiratory failure with hypoxia (HCC)   Acute on chronic systolic and diastolic heart failure, NYHA class 3 (HCC)   Acute renal failure superimposed on stage 3 chronic kidney disease (HCC)   Healthcare-associated pneumonia   COPD, severe ([place])   1. Acute on chronic respiratory failure hypoxia patient continues on full support on assist control mode on 30% FiO2 doing well with the settings however is not able to tolerate any weaning. 2. Acute on chronic systolic heart failure monitor fluid status per nephrology recommendations 3. Acute renal failure on dialysis 4. Healthcare associated pneumonia treated improved 5. Severe COPD at baseline limiting factor perhaps and inability to wean along with the heart failure   I have personally seen and evaluated the patient, evaluated laboratory and imaging results, formulated the assessment and plan and placed orders. The Patient requires high complexity decision making for assessment and support.  Case was discussed on Rounds with the Respiratory Therapy Staff  Allyne Gee, MD [facility] Pulmonary Critical  Care Medicine Sleep Medicine

## 2019-04-08 NOTE — Progress Notes (Signed)
[facility]  ROUNDING NOTE   Subjective:  Patient seen and evaluated at bedside. Still on the ventilator.    Wound healing  Vital signs in last 24 hours:  Temperature 96.9 pulse 78 respirations 29 blood pressure 113/49  Physical Exam: General: Critically ill-appearing  Head: Normocephalic, atraumatic. Moist oral mucosal membranes  Eyes: Anicteric  Neck: Tracheostomy in place  Lungs:  Scattered rhonchi, vent assisted  Heart: S1S2 no rubs  Abdomen:  Soft, nontender, bowel sounds present, PEG in place  Extremities: 2+ peripheral edema.  Neurologic: Not following commands  Skin: No rash  Access: Right internal jugular permcath    Basic Metabolic Panel: Recent Labs  Lab 04/04/19 0643 04/06/19 0537 04/08/19 0647  NA 130* 133* 132*  K 3.9 4.0 4.3  CL 92* 95* 95*  CO2 '26 27 25  '$ GLUCOSE 164* 163* 174*  BUN 91* 77* 76*  CREATININE 3.10* 2.78* 2.71*  CALCIUM 8.7* 8.9 8.7*  PHOS 2.4* 2.8 2.5    Liver Function Tests: Recent Labs  Lab 04/04/19 0643 04/06/19 0537 04/08/19 0647  ALBUMIN 1.7* 1.7* 1.6*   No results for input(s): LIPASE, AMYLASE in the last 168 hours. No results for input(s): AMMONIA in the last 168 hours.  CBC: Recent Labs  Lab 04/02/19 0538 04/04/19 0643 04/05/19 1047 04/06/19 0537  WBC 8.1 7.8 8.8 7.7  HGB 7.6* 6.6* 7.9* 8.2*  HCT 23.1* 20.1* 23.8* 24.8*  MCV 87.2 86.3 85.9 86.1  PLT 158 168 181 175    Cardiac Enzymes: No results for input(s): CKTOTAL, CKMB, CKMBINDEX, TROPONINI in the last 168 hours.  BNP: Invalid input(s): POCBNP  CBG: No results for input(s): GLUCAP in the last 168 hours.  Microbiology: Results for orders placed or performed during the hospital encounter of 02/15/19  Culture, Urine     Status: None   Collection Time: 02/15/19 12:52 AM   Specimen: Urine, Random  Result Value Ref Range Status   Specimen Description URINE, RANDOM  Final   Special Requests NONE  Final   Culture   Final    NO  GROWTH Performed at [facility], [place] [address], [place], [place] [address]    Report Status 02/17/2019 FINAL  Final  Culture, respiratory (non-expectorated)     Status: None   Collection Time: 02/15/19 10:50 PM   Specimen: Tracheal Aspirate; Respiratory  Result Value Ref Range Status   Specimen Description TRACHEAL ASPIRATE  Final   Special Requests NONE  Final   Gram Stain   Final    NO WBC SEEN RARE GRAM POSITIVE COCCI IN PAIRS Performed at [facility], [address], [place], [place] [address]    Culture MODERATE ENTEROCOCCUS FAECALIS  Final   Report Status 02/19/2019 FINAL  Final   Organism ID, Bacteria ENTEROCOCCUS FAECALIS  Final      Susceptibility   Enterococcus faecalis - MIC*    AMPICILLIN <=2 SENSITIVE Sensitive     VANCOMYCIN 1 SENSITIVE Sensitive     GENTAMICIN SYNERGY RESISTANT Resistant     * MODERATE ENTEROCOCCUS FAECALIS  SARS Coronavirus 2 by RT PCR (hospital order, performed in [facility] hospital lab) Nasopharyngeal Nasopharyngeal Swab     Status: None   Collection Time: 02/16/19  2:20 PM   Specimen: Nasopharyngeal Swab  Result Value Ref Range Status   SARS Coronavirus 2 NEGATIVE NEGATIVE Final    Comment: (NOTE) If result is NEGATIVE SARS-CoV-2 target nucleic acids are NOT DETECTED. The SARS-CoV-2 RNA is generally detectable in  upper and lower  respiratory specimens during the acute phase of infection. The lowest  concentration of SARS-CoV-2 viral copies this assay can detect is 250  copies / mL. A negative result does not preclude SARS-CoV-2 infection  and should not be used as the sole basis for treatment or other  patient management decisions.  A negative result may occur with  improper specimen collection / handling, submission of specimen other  than nasopharyngeal swab, presence of viral mutation(s) within the  areas targeted by this assay, and inadequate number of viral copies  (<250 copies / mL). A negative result must be combined  with clinical  observations, patient history, and epidemiological information. If result is POSITIVE SARS-CoV-2 target nucleic acids are DETECTED. The SARS-CoV-2 RNA is generally detectable in upper and lower  respiratory specimens dur ing the acute phase of infection.  Positive  results are indicative of active infection with SARS-CoV-2.  Clinical  correlation with patient history and other diagnostic information is  necessary to determine patient infection status.  Positive results do  not rule out bacterial infection or co-infection with other viruses. If result is PRESUMPTIVE POSTIVE SARS-CoV-2 nucleic acids MAY BE PRESENT.   A presumptive positive result was obtained on the submitted specimen  and confirmed on repeat testing.  While 2019 novel coronavirus  (SARS-CoV-2) nucleic acids may be present in the submitted sample  additional confirmatory testing may be necessary for epidemiological  and / or clinical management purposes  to differentiate between  SARS-CoV-2 and other Sarbecovirus currently known to infect humans.  If clinically indicated additional testing with an alternate test  methodology [phone]) is advised. The SARS-CoV-2 RNA is generally  detectable in upper and lower respiratory sp ecimens during the acute  phase of infection. The expected result is Negative. Fact Sheet for Patients:  StrictlyIdeas.no Fact Sheet for Healthcare Providers: BankingDealers.co.za This test is not yet approved or cleared by the [place] FDA and has been authorized for detection and/or diagnosis of SARS-CoV-2 by FDA under an Emergency Use Authorization (EUA).  This EUA will remain in effect (meaning this test can be used) for the duration of the COVID-19 declaration under Section 564(b)(1) of the Act, 21 U.S.C. section 360bbb-3(b)(1), unless the authorization is terminated or revoked sooner. Performed at [facility], [place] [address], [place], [place] [address]   SARS CORONAVIRUS 2 (TAT 6-24 HRS) Nasopharyngeal Nasopharyngeal Swab     Status: None   Collection Time: 03/07/19  7:40 AM   Specimen: Nasopharyngeal Swab  Result Value Ref Range Status   SARS Coronavirus 2 NEGATIVE NEGATIVE Final    Comment: (NOTE) SARS-CoV-2 target nucleic acids are NOT DETECTED. The SARS-CoV-2 RNA is generally detectable in upper and lower respiratory specimens during the acute phase of infection. Negative results do not preclude SARS-CoV-2 infection, do not rule out co-infections with other pathogens, and should not be used as the sole basis for treatment or other patient management decisions. Negative results must be combined with clinical observations, patient history, and epidemiological information. The expected result is Negative. Fact Sheet for Patients: SugarRoll.be Fact Sheet for Healthcare Providers: https://www.woods-mathews.com/ This test is not yet approved or cleared by the [place] FDA and  has been authorized for detection and/or diagnosis of SARS-CoV-2 by FDA under an Emergency Use Authorization (EUA). This EUA will remain  in effect (meaning this test can be used) for the duration of the COVID-19 declaration under Section 56 4(b)(1) of the Act, 21 U.S.C. section 360bbb-3(b)(1), unless  the authorization is terminated or revoked sooner. Performed at [facility], [place] [address], [place], [place] [address]   Stat Gram stain     Status: None   Collection Time: 03/11/19 12:46 PM   Specimen: Pleura; Body Fluid  Result Value Ref Range Status   Specimen Description PLEURAL LEFT  Final   Special Requests Normal  Final   Gram Stain   Final    NO WBC SEEN NO ORGANISMS SEEN Performed at [facility], [address], [place], [place] [address]    Report Status 03/11/2019 FINAL  Final  Gram stain     Status: None   Collection Time: 03/12/19 10:37 AM    Specimen: PATH Cytology Pleural fluid  Result Value Ref Range Status   Specimen Description PLEURAL RIGHT  Final   Special Requests NONE  Final   Gram Stain   Final    RARE WBC PRESENT, PREDOMINANTLY MONONUCLEAR NO ORGANISMS SEEN Performed at [facility], [place] [address], [place], [place] [address]    Report Status 03/12/2019 FINAL  Final  Culture, body fluid-bottle     Status: None   Collection Time: 03/12/19 10:37 AM   Specimen: Pleura  Result Value Ref Range Status   Specimen Description PLEURAL RIGHT  Final   Special Requests NONE  Final   Culture   Final    NO GROWTH 5 DAYS Performed at [place] [address] [place], [place] [address]    Report Status 03/17/2019 FINAL  Final    Coagulation Studies: No results for input(s): LABPROT, INR in the last 72 hours.  Urinalysis: No results for input(s): COLORURINE, LABSPEC, PHURINE, GLUCOSEU, HGBUR, BILIRUBINUR, KETONESUR, PROTEINUR, UROBILINOGEN, NITRITE, LEUKOCYTESUR in the last 72 hours.  Invalid input(s): APPERANCEUR    Imaging: No results found.   Medications:     iohexol  Assessment/ Plan:  73 y.o. male with a PMHx of BPH, allergic rhinitis, COPD, diabetes mellitus type 2, hypertension, GERD, acute respiratory failure, congestive heart failure, chronic kidney disease stage IIIb EGFR 43 who was admitted to [facility] on 02/15/2019 for ongoing management of acute respiratory failure.   1.    End-stage renal disease.  Patient still dialysis dependent.  We will plan for hemodialysis today.  2.  Acute respiratory failure.  Patient still on the ventilator.  FiO2 30% with a PEEP of 5.  Pulmonary/critical care following.  3.  Anemia of chronic kidney disease.  Repeat CBC today.  4.  Secondary hyperparathyroidism.  Phosphorus 2.5 at last check.  Continues to have severe protein calorie malnutrition with an albumin of 1.6.  LOS: 0 Suriyah Vergara 12/4/20208:20 AM

## 2019-04-09 DIAGNOSIS — J9621 Acute and chronic respiratory failure with hypoxia: Secondary | ICD-10-CM | POA: Diagnosis not present

## 2019-04-09 DIAGNOSIS — N179 Acute kidney failure, unspecified: Secondary | ICD-10-CM | POA: Diagnosis not present

## 2019-04-09 DIAGNOSIS — I5043 Acute on chronic combined systolic (congestive) and diastolic (congestive) heart failure: Secondary | ICD-10-CM | POA: Diagnosis not present

## 2019-04-09 DIAGNOSIS — J449 Chronic obstructive pulmonary disease, unspecified: Secondary | ICD-10-CM | POA: Diagnosis not present

## 2019-04-09 NOTE — Progress Notes (Signed)
Pulmonary Critical Care Medicine [place]   PULMONARY CRITICAL CARE SERVICE  PROGRESS NOTE  Date of Service: 04/09/2019  Shiheem Koleszar  [id number]  DOB: 1945/07/16   DOA: 02/15/2019  Referring Physician: [name], MD  HPI: Khaleel Fingerhut is a 73 y.o. male seen for follow up of Acute on Chronic Respiratory Failure.  Patient continues to do poorly not able to tolerate any weaning remains on assist control currently is on 30% FiO2 has been on a PEEP of 5  Medications: Reviewed on Rounds  Physical Exam:  Vitals: Temperature 97.1 pulse 84 respiratory rate 27 blood pressure 101/58  Ventilator Settings mode ventilation assist control FiO2 30% tidal volume 449 PEEP 5  . General: Comfortable at this time . Eyes: Grossly normal lids, irises & conjunctiva . ENT: grossly tongue is normal . Neck: no obvious mass . Cardiovascular: S1 S2 normal no gallop . Respiratory: No rhonchi coarse breath sounds are noted . Abdomen: soft . Skin: no rash seen on limited exam . Musculoskeletal: not rigid . Psychiatric:unable to assess . Neurologic: no seizure no involuntary movements         Lab Data:   Basic Metabolic Panel: Recent Labs  Lab 04/04/19 0643 04/06/19 0537 04/08/19 0647 04/08/19 1500  NA 130* 133* 132*  --   K 3.9 4.0 4.3  --   CL 92* 95* 95*  --   CO2 26 27 25   --   GLUCOSE 164* 163* 174*  --   BUN 91* 77* 76*  --   CREATININE 3.10* 2.78* 2.71*  --   CALCIUM 8.7* 8.9 8.7*  --   MG  --   --   --  2.0  PHOS 2.4* 2.8 2.5  --     ABG: No results for input(s): PHART, PCO2ART, PO2ART, HCO3, O2SAT in the last 168 hours.  Liver Function Tests: Recent Labs  Lab 04/04/19 0643 04/06/19 0537 04/08/19 0647  ALBUMIN 1.7* 1.7* 1.6*   No results for input(s): LIPASE, AMYLASE in the last 168 hours. No results for input(s): AMMONIA in the last 168 hours.  CBC: Recent Labs  Lab 04/04/19 0643 04/05/19 1047 04/06/19 0537 04/08/19 0647  WBC 7.8 8.8  7.7 8.1  HGB 6.6* 7.9* 8.2* 7.8*  HCT 20.1* 23.8* 24.8* 23.5*  MCV 86.3 85.9 86.1 86.1  PLT 168 181 175 191    Cardiac Enzymes: No results for input(s): CKTOTAL, CKMB, CKMBINDEX, TROPONINI in the last 168 hours.  BNP (last 3 results) No results for input(s): BNP in the last 8760 hours.  ProBNP (last 3 results) No results for input(s): PROBNP in the last 8760 hours.  Radiological Exams: No results found.  Assessment/Plan Active Problems:   Acute on chronic respiratory failure with hypoxia (HCC)   Acute on chronic systolic and diastolic heart failure, NYHA class 3 (HCC)   Acute renal failure superimposed on stage 3 chronic kidney disease (HCC)   Healthcare-associated pneumonia   COPD, severe ([place])   1. Acute on chronic respiratory failure with hypoxia we will continue with full vent support assessing the RSB I daily however patient has been failing 2. Acute renal failure patient is on hemodialysis followed by nephrology 3. Healthcare associated pneumonia treated 4. Acute on chronic diastolic heart failure need to again monitor fluid status quite closely 5. Severe COPD no change 6. Healthcare associated pneumonia treated no changes noted currently   I have personally seen and evaluated the patient, evaluated laboratory and imaging results, formulated the assessment and  plan and placed orders. The Patient requires high complexity decision making for assessment and support.  Case was discussed on Rounds with the Respiratory Therapy Staff  Allyne Gee, MD [facility] Pulmonary Critical Care Medicine Sleep Medicine

## 2019-04-10 DIAGNOSIS — N179 Acute kidney failure, unspecified: Secondary | ICD-10-CM | POA: Diagnosis not present

## 2019-04-10 DIAGNOSIS — J9621 Acute and chronic respiratory failure with hypoxia: Secondary | ICD-10-CM | POA: Diagnosis not present

## 2019-04-10 DIAGNOSIS — I5043 Acute on chronic combined systolic (congestive) and diastolic (congestive) heart failure: Secondary | ICD-10-CM | POA: Diagnosis not present

## 2019-04-10 DIAGNOSIS — J449 Chronic obstructive pulmonary disease, unspecified: Secondary | ICD-10-CM | POA: Diagnosis not present

## 2019-04-10 NOTE — Progress Notes (Signed)
Pulmonary Critical Care Medicine [place]   PULMONARY CRITICAL CARE SERVICE  PROGRESS NOTE  Date of Service: 04/10/2019  Antonious Kort  [name]  DOB: 10-07-1945   DOA: 02/15/2019  Referring Physician: [name], MD  HPI: Paul Ball is a 73 y.o. male seen for follow up of Acute on Chronic Respiratory Failure.  Currently is on pressure support mode has been on 30% FiO2 with a goal of 6 hours doing well today  Medications: Reviewed on Rounds  Physical Exam:  Vitals: Temperature 98.1 pulse 99 respiratory 27 blood pressure 96/53 saturations 100%  Ventilator Settings on the ventilator on pressure support  . General: Comfortable at this time . Eyes: Grossly normal lids, irises & conjunctiva . ENT: grossly tongue is normal . Neck: no obvious mass . Cardiovascular: S1 S2 normal no gallop . Respiratory: No rhonchi coarse breath sounds are noted . Abdomen: soft . Skin: no rash seen on limited exam . Musculoskeletal: not rigid . Psychiatric:unable to assess . Neurologic: no seizure no involuntary movements         Lab Data:   Basic Metabolic Panel: Recent Labs  Lab 04/04/19 0643 04/06/19 0537 04/08/19 0647 04/08/19 1500  NA 130* 133* 132*  --   K 3.9 4.0 4.3  --   CL 92* 95* 95*  --   CO2 26 27 25   --   GLUCOSE 164* 163* 174*  --   BUN 91* 77* 76*  --   CREATININE 3.10* 2.78* 2.71*  --   CALCIUM 8.7* 8.9 8.7*  --   MG  --   --   --  2.0  PHOS 2.4* 2.8 2.5  --     ABG: No results for input(s): PHART, PCO2ART, PO2ART, HCO3, O2SAT in the last 168 hours.  Liver Function Tests: Recent Labs  Lab 04/04/19 0643 04/06/19 0537 04/08/19 0647  ALBUMIN 1.7* 1.7* 1.6*   No results for input(s): LIPASE, AMYLASE in the last 168 hours. No results for input(s): AMMONIA in the last 168 hours.  CBC: Recent Labs  Lab 04/04/19 0643 04/05/19 1047 04/06/19 0537 04/08/19 0647  WBC 7.8 8.8 7.7 8.1  HGB 6.6* 7.9* 8.2* 7.8*  HCT 20.1* 23.8*  24.8* 23.5*  MCV 86.3 85.9 86.1 86.1  PLT 168 181 175 191    Cardiac Enzymes: No results for input(s): CKTOTAL, CKMB, CKMBINDEX, TROPONINI in the last 168 hours.  BNP (last 3 results) No results for input(s): BNP in the last 8760 hours.  ProBNP (last 3 results) No results for input(s): PROBNP in the last 8760 hours.  Radiological Exams: No results found.  Assessment/Plan Active Problems:   Acute on chronic respiratory failure with hypoxia (HCC)   Acute on chronic systolic and diastolic heart failure, NYHA class 3 (HCC)   Acute renal failure superimposed on stage 3 chronic kidney disease (HCC)   Healthcare-associated pneumonia   COPD, severe ([place])   1. Acute on chronic respiratory failure hypoxia patient continues on pressure support mode is on 30% FiO2 goal 6 hours 2. Acute on chronic systolic heart failure compensated 3. Acute renal failure followed by nephrology 4. Healthcare associated pneumonia treated 5. Severe COPD continue nebulizers as necessary   I have personally seen and evaluated the patient, evaluated laboratory and imaging results, formulated the assessment and plan and placed orders. The Patient requires high complexity decision making for assessment and support.  Case was discussed on Rounds with the Respiratory Therapy Staff  Allyne Gee, MD [facility] Pulmonary Critical  Care Medicine Sleep Medicine

## 2019-04-11 DIAGNOSIS — N179 Acute kidney failure, unspecified: Secondary | ICD-10-CM | POA: Diagnosis not present

## 2019-04-11 DIAGNOSIS — J449 Chronic obstructive pulmonary disease, unspecified: Secondary | ICD-10-CM | POA: Diagnosis not present

## 2019-04-11 DIAGNOSIS — J9621 Acute and chronic respiratory failure with hypoxia: Secondary | ICD-10-CM | POA: Diagnosis not present

## 2019-04-11 DIAGNOSIS — I5043 Acute on chronic combined systolic (congestive) and diastolic (congestive) heart failure: Secondary | ICD-10-CM | POA: Diagnosis not present

## 2019-04-11 LAB — CBC
HCT: 22.4 % — ABNORMAL LOW (ref 39.0–52.0)
Hemoglobin: 7.4 g/dL — ABNORMAL LOW (ref 13.0–17.0)
MCH: 28.6 pg (ref 26.0–34.0)
MCHC: 33 g/dL (ref 30.0–36.0)
MCV: 86.5 fL (ref 80.0–100.0)
Platelets: 190 10*3/uL (ref 150–400)
RBC: 2.59 MIL/uL — ABNORMAL LOW (ref 4.22–5.81)
RDW: 14.8 % (ref 11.5–15.5)
WBC: 10.7 10*3/uL — ABNORMAL HIGH (ref 4.0–10.5)
nRBC: 0 % (ref 0.0–0.2)

## 2019-04-11 LAB — RENAL FUNCTION PANEL
Albumin: 1.7 g/dL — ABNORMAL LOW (ref 3.5–5.0)
Anion gap: 12 (ref 5–15)
BUN: 96 mg/dL — ABNORMAL HIGH (ref 8–23)
CO2: 24 mmol/L (ref 22–32)
Calcium: 9.2 mg/dL (ref 8.9–10.3)
Chloride: 96 mmol/L — ABNORMAL LOW (ref 98–111)
Creatinine, Ser: 3.14 mg/dL — ABNORMAL HIGH (ref 0.61–1.24)
GFR calc Af Amer: 22 mL/min — ABNORMAL LOW (ref >60)
GFR calc non Af Amer: 19 mL/min — ABNORMAL LOW (ref >60)
Glucose, Bld: 171 mg/dL — ABNORMAL HIGH (ref 70–99)
Phosphorus: 2.9 mg/dL (ref 2.5–4.6)
Potassium: 3.9 mmol/L (ref 3.5–5.1)
Sodium: 132 mmol/L — ABNORMAL LOW (ref 135–145)

## 2019-04-11 NOTE — Progress Notes (Signed)
Pulmonary Critical Care Medicine [place]   PULMONARY CRITICAL CARE SERVICE  PROGRESS NOTE  Date of Service: 04/11/2019  Paul Ball  [name]  DOB: 1945-09-10   DOA: 02/15/2019  Referring Physician: [name], MD  HPI: Paul Ball is a 73 y.o. male seen for follow up of Acute on Chronic Respiratory Failure.  No change patient remains on the ventilator has been on 30% FiO2 on full support on assist control mode.  Has not been tolerating any attempts at weaning.  Medications: Reviewed on Rounds  Physical Exam:  Vitals: Temperature 97.0 pulse 81 respiratory rate 10 blood pressure 158/48 saturations 99%  Ventilator Settings mode ventilation assist control FiO2 is 30% tidal volume 445 PEEP 5  . General: Comfortable at this time . Eyes: Grossly normal lids, irises & conjunctiva . ENT: grossly tongue is normal . Neck: no obvious mass . Cardiovascular: S1 S2 normal no gallop . Respiratory: No rhonchi coarse breath sounds are noted at this time . Abdomen: soft . Skin: no rash seen on limited exam . Musculoskeletal: not rigid . Psychiatric:unable to assess . Neurologic: no seizure no involuntary movements         Lab Data:   Basic Metabolic Panel: Recent Labs  Lab 04/06/19 0537 04/08/19 0647 04/08/19 1500 04/11/19 0656  NA 133* 132*  --  132*  K 4.0 4.3  --  3.9  CL 95* 95*  --  96*  CO2 27 25  --  24  GLUCOSE 163* 174*  --  171*  BUN 77* 76*  --  96*  CREATININE 2.78* 2.71*  --  3.14*  CALCIUM 8.9 8.7*  --  9.2  MG  --   --  2.0  --   PHOS 2.8 2.5  --  2.9    ABG: No results for input(s): PHART, PCO2ART, PO2ART, HCO3, O2SAT in the last 168 hours.  Liver Function Tests: Recent Labs  Lab 04/06/19 0537 04/08/19 0647 04/11/19 0656  ALBUMIN 1.7* 1.6* 1.7*   No results for input(s): LIPASE, AMYLASE in the last 168 hours. No results for input(s): AMMONIA in the last 168 hours.  CBC: Recent Labs  Lab 04/05/19 1047  04/06/19 0537 04/08/19 0647 04/11/19 0656  WBC 8.8 7.7 8.1 10.7*  HGB 7.9* 8.2* 7.8* 7.4*  HCT 23.8* 24.8* 23.5* 22.4*  MCV 85.9 86.1 86.1 86.5  PLT 181 175 191 190    Cardiac Enzymes: No results for input(s): CKTOTAL, CKMB, CKMBINDEX, TROPONINI in the last 168 hours.  BNP (last 3 results) No results for input(s): BNP in the last 8760 hours.  ProBNP (last 3 results) No results for input(s): PROBNP in the last 8760 hours.  Radiological Exams: No results found.  Assessment/Plan Active Problems:   Acute on chronic respiratory failure with hypoxia (HCC)   Acute on chronic systolic and diastolic heart failure, NYHA class 3 (HCC)   Acute renal failure superimposed on stage 3 chronic kidney disease (HCC)   Healthcare-associated pneumonia   COPD, severe ([place])   1. Acute on chronic respiratory failure with hypoxia patient apparently tolerated about 4 hours of pressure support respiratory therapist plan to reassess long-term however I do not believe the patient is going to be able to wean 2. Acute on chronic heart failure compensated we will continue to monitor fluid status per nephrology recommendations 3. Acute chronic renal failure on hemodialysis followed by nephrology will continue with supportive care. 4. Healthcare associated pneumonia treated continue to monitor 5. Severe COPD at baseline  we will continue to monitor closely   I have personally seen and evaluated the patient, evaluated laboratory and imaging results, formulated the assessment and plan and placed orders. The Patient requires high complexity decision making for assessment and support.  Case was discussed on Rounds with the Respiratory Therapy Staff  Allyne Gee, MD [facility] Pulmonary Critical Care Medicine Sleep Medicine

## 2019-04-11 NOTE — Progress Notes (Signed)
[facility]  ROUNDING NOTE   Subjective:  Patient remains critically ill. Still on the ventilator. Due for dialysis treatment today.    Wound healing  Vital signs in last 24 hours:  Temperature 97 pulse 81 respirations 10 blood pressure 158/45  Physical Exam: General: Critically ill-appearing  Head: Normocephalic, atraumatic. Moist oral mucosal membranes  Eyes: Anicteric  Neck: Tracheostomy in place  Lungs:  Scattered rhonchi, vent assisted  Heart: S1S2 no rubs  Abdomen:  Soft, nontender, bowel sounds present, PEG in place  Extremities: 2+ peripheral edema.  Neurologic: Not following commands  Skin: No rash  Access: Right internal jugular permcath    Basic Metabolic Panel: Recent Labs  Lab 04/06/19 0537 04/08/19 0647 04/08/19 1500 04/11/19 0656  NA 133* 132*  --  132*  K 4.0 4.3  --  3.9  CL 95* 95*  --  96*  CO2 27 25  --  24  GLUCOSE 163* 174*  --  171*  BUN 77* 76*  --  96*  CREATININE 2.78* 2.71*  --  3.14*  CALCIUM 8.9 8.7*  --  9.2  MG  --   --  2.0  --   PHOS 2.8 2.5  --  2.9    Liver Function Tests: Recent Labs  Lab 04/06/19 0537 04/08/19 0647 04/11/19 0656  ALBUMIN 1.7* 1.6* 1.7*   No results for input(s): LIPASE, AMYLASE in the last 168 hours. No results for input(s): AMMONIA in the last 168 hours.  CBC: Recent Labs  Lab 04/05/19 1047 04/06/19 0537 04/08/19 0647 04/11/19 0656  WBC 8.8 7.7 8.1 10.7*  HGB 7.9* 8.2* 7.8* 7.4*  HCT 23.8* 24.8* 23.5* 22.4*  MCV 85.9 86.1 86.1 86.5  PLT 181 175 191 190    Cardiac Enzymes: No results for input(s): CKTOTAL, CKMB, CKMBINDEX, TROPONINI in the last 168 hours.  BNP: Invalid input(s): POCBNP  CBG: No results for input(s): GLUCAP in the last 168 hours.  Microbiology: Results for orders placed or performed during the hospital encounter of 02/15/19  Culture, Urine     Status: None   Collection Time: 02/15/19 12:52 AM   Specimen: Urine, Random  Result Value Ref Range Status    Specimen Description URINE, RANDOM  Final   Special Requests NONE  Final   Culture   Final    NO GROWTH Performed at [facility], [place] [address], [place], [place] [address]    Report Status 02/17/2019 FINAL  Final  Culture, respiratory (non-expectorated)     Status: None   Collection Time: 02/15/19 10:50 PM   Specimen: Tracheal Aspirate; Respiratory  Result Value Ref Range Status   Specimen Description TRACHEAL ASPIRATE  Final   Special Requests NONE  Final   Gram Stain   Final    NO WBC SEEN RARE GRAM POSITIVE COCCI IN PAIRS Performed at [facility], [address] [address] [place], [place] [address]    Culture MODERATE ENTEROCOCCUS FAECALIS  Final   Report Status 02/19/2019 FINAL  Final   Organism ID, Bacteria ENTEROCOCCUS FAECALIS  Final      Susceptibility   Enterococcus faecalis - MIC*    AMPICILLIN <=2 SENSITIVE Sensitive     VANCOMYCIN 1 SENSITIVE Sensitive     GENTAMICIN SYNERGY RESISTANT Resistant     * MODERATE ENTEROCOCCUS FAECALIS  SARS Coronavirus 2 by RT PCR (hospital order, performed in [place] hospital lab) Nasopharyngeal Nasopharyngeal Swab     Status: None   Collection Time: 02/16/19  2:20 PM   Specimen: Nasopharyngeal Swab  Result Value Ref Range Status   SARS Coronavirus 2 NEGATIVE NEGATIVE Final    Comment: (NOTE) If result is NEGATIVE SARS-CoV-2 target nucleic acids are NOT DETECTED. The SARS-CoV-2 RNA is generally detectable in upper and lower  respiratory specimens during the acute phase of infection. The lowest  concentration of SARS-CoV-2 viral copies this assay can detect is 250  copies / mL. A negative result does not preclude SARS-CoV-2 infection  and should not be used as the sole basis for treatment or other  patient management decisions.  A negative result may occur with  improper specimen collection / handling, submission of specimen other  than nasopharyngeal swab, presence of viral mutation(s) within the  areas targeted by  this assay, and inadequate number of viral copies  (<250 copies / mL). A negative result must be combined with clinical  observations, patient history, and epidemiological information. If result is POSITIVE SARS-CoV-2 target nucleic acids are DETECTED. The SARS-CoV-2 RNA is generally detectable in upper and lower  respiratory specimens dur ing the acute phase of infection.  Positive  results are indicative of active infection with SARS-CoV-2.  Clinical  correlation with patient history and other diagnostic information is  necessary to determine patient infection status.  Positive results do  not rule out bacterial infection or co-infection with other viruses. If result is PRESUMPTIVE POSTIVE SARS-CoV-2 nucleic acids MAY BE PRESENT.   A presumptive positive result was obtained on the submitted specimen  and confirmed on repeat testing.  While 2019 novel coronavirus  (SARS-CoV-2) nucleic acids may be present in the submitted sample  additional confirmatory testing may be necessary for epidemiological  and / or clinical management purposes  to differentiate between  SARS-CoV-2 and other Sarbecovirus currently known to infect humans.  If clinically indicated additional testing with an alternate test  methodology [id number]) is advised. The SARS-CoV-2 RNA is generally  detectable in upper and lower respiratory sp ecimens during the acute  phase of infection. The expected result is Negative. Fact Sheet for Patients:  StrictlyIdeas.no Fact Sheet for Healthcare Providers: BankingDealers.co.za This test is not yet approved or cleared by the [place] FDA and has been authorized for detection and/or diagnosis of SARS-CoV-2 by FDA under an Emergency Use Authorization (EUA).  This EUA will remain in effect (meaning this test can be used) for the duration of the COVID-19 declaration under Section 564(b)(1) of the Act, 21 U.S.C. section  360bbb-3(b)(1), unless the authorization is terminated or revoked sooner. Performed at [facility], [place] [address], [place], [place] [address]   SARS CORONAVIRUS 2 (TAT 6-24 HRS) Nasopharyngeal Nasopharyngeal Swab     Status: None   Collection Time: 03/07/19  7:40 AM   Specimen: Nasopharyngeal Swab  Result Value Ref Range Status   SARS Coronavirus 2 NEGATIVE NEGATIVE Final    Comment: (NOTE) SARS-CoV-2 target nucleic acids are NOT DETECTED. The SARS-CoV-2 RNA is generally detectable in upper and lower respiratory specimens during the acute phase of infection. Negative results do not preclude SARS-CoV-2 infection, do not rule out co-infections with other pathogens, and should not be used as the sole basis for treatment or other patient management decisions. Negative results must be combined with clinical observations, patient history, and epidemiological information. The expected result is Negative. Fact Sheet for Patients: SugarRoll.be Fact Sheet for Healthcare Providers: https://www.woods-mathews.com/ This test is not yet approved or cleared by the [place] FDA and  has been authorized for detection  and/or diagnosis of SARS-CoV-2 by FDA under an Emergency Use Authorization (EUA). This EUA will remain  in effect (meaning this test can be used) for the duration of the COVID-19 declaration under Section 56 4(b)(1) of the Act, 21 U.S.C. section 360bbb-3(b)(1), unless the authorization is terminated or revoked sooner. Performed at [facility], [place] [address] [place], [place] [address]   Stat Gram stain     Status: None   Collection Time: 03/11/19 12:46 PM   Specimen: Pleura; Body Fluid  Result Value Ref Range Status   Specimen Description PLEURAL LEFT  Final   Special Requests Normal  Final   Gram Stain   Final    NO WBC SEEN NO ORGANISMS SEEN Performed at [facility], [address], [place], [place]  [address]    Report Status 03/11/2019 FINAL  Final  Gram stain     Status: None   Collection Time: 03/12/19 10:37 AM   Specimen: PATH Cytology Pleural fluid  Result Value Ref Range Status   Specimen Description PLEURAL RIGHT  Final   Special Requests NONE  Final   Gram Stain   Final    RARE WBC PRESENT, PREDOMINANTLY MONONUCLEAR NO ORGANISMS SEEN Performed at [facility], [place] [address], [place], [place] [address]    Report Status 03/12/2019 FINAL  Final  Culture, body fluid-bottle     Status: None   Collection Time: 03/12/19 10:37 AM   Specimen: Pleura  Result Value Ref Range Status   Specimen Description PLEURAL RIGHT  Final   Special Requests NONE  Final   Culture   Final    NO GROWTH 5 DAYS Performed at [place] [address], [place], [place] [address]    Report Status 03/17/2019 FINAL  Final    Coagulation Studies: No results for input(s): LABPROT, INR in the last 72 hours.  Urinalysis: No results for input(s): COLORURINE, LABSPEC, PHURINE, GLUCOSEU, HGBUR, BILIRUBINUR, KETONESUR, PROTEINUR, UROBILINOGEN, NITRITE, LEUKOCYTESUR in the last 72 hours.  Invalid input(s): APPERANCEUR    Imaging: No results found.   Medications:     iohexol  Assessment/ Plan:  73 y.o. male with a PMHx of BPH, allergic rhinitis, COPD, diabetes mellitus type 2, hypertension, GERD, acute respiratory failure, congestive heart failure, chronic kidney disease stage IIIb EGFR 43 who was admitted to [facility] on 02/15/2019 for ongoing management of acute respiratory failure.   1.    End-stage renal disease.  Patient seen and evaluated at bedside.  Creatinine currently 3.14 with a BUN of 96.  Patient remains dialysis dependent at this time.  We will plan for hemodialysis treatment today.  2.  Acute respiratory failure.  Patient remains ventilator dependent.  Continue current ventilatory support.  3.  Anemia of chronic kidney disease.  Hemoglobin down a bit to 7.4.  Continue  to monitor hemoglobin closely.  Consider blood transfusion for hemoglobin of 7 or less.  4.  Secondary hyperparathyroidism.  Phosphorus up a bit to 2.9 and acceptable.  Continue to periodically monitor bone mineral metabolism parameters.  LOS: 0   12/7/20208:41 AM

## 2019-04-12 DIAGNOSIS — J9621 Acute and chronic respiratory failure with hypoxia: Secondary | ICD-10-CM | POA: Diagnosis not present

## 2019-04-12 DIAGNOSIS — I5043 Acute on chronic combined systolic (congestive) and diastolic (congestive) heart failure: Secondary | ICD-10-CM | POA: Diagnosis not present

## 2019-04-12 DIAGNOSIS — N179 Acute kidney failure, unspecified: Secondary | ICD-10-CM | POA: Diagnosis not present

## 2019-04-12 DIAGNOSIS — J449 Chronic obstructive pulmonary disease, unspecified: Secondary | ICD-10-CM | POA: Diagnosis not present

## 2019-04-12 NOTE — Progress Notes (Signed)
Pulmonary Critical Care Medicine [facility]   PULMONARY CRITICAL CARE SERVICE  PROGRESS NOTE  Date of Service: 04/12/2019  Paul Ball  [id number]  DOB: [birth year]   DOA: 02/15/2019  Referring Physician: [name], MD  HPI: Paul Ball is a 73 y.o. male seen for follow up of Acute on Chronic Respiratory Failure.  Patient currently is on full support has been on assist control mode on 30% FiO2 with a PEEP of 5 attempted at weaning did not tolerate  Medications: Reviewed on Rounds  Physical Exam:  Vitals: Temperature 99.4 pulse 95 respiratory 24 blood pressure 108/59 saturations 97%  Ventilator Settings currently on assist control FiO2 is 30% tidal volume 420 PEEP 5  . General: Comfortable at this time . Eyes: Grossly normal lids, irises & conjunctiva . ENT: grossly tongue is normal . Neck: no obvious mass . Cardiovascular: S1 S2 normal no gallop . Respiratory: No rhonchi no rales are noted at this time . Abdomen: soft . Skin: no rash seen on limited exam . Musculoskeletal: not rigid . Psychiatric:unable to assess . Neurologic: no seizure no involuntary movements         Lab Data:   Basic Metabolic Panel: Recent Labs  Lab 04/06/19 0537 04/08/19 0647 04/08/19 1500 04/11/19 0656  NA 133* 132*  --  132*  K 4.0 4.3  --  3.9  CL 95* 95*  --  96*  CO2 27 25  --  24  GLUCOSE 163* 174*  --  171*  BUN 77* 76*  --  96*  CREATININE 2.78* 2.71*  --  3.14*  CALCIUM 8.9 8.7*  --  9.2  MG  --   --  2.0  --   PHOS 2.8 2.5  --  2.9    ABG: No results for input(s): PHART, PCO2ART, PO2ART, HCO3, O2SAT in the last 168 hours.  Liver Function Tests: Recent Labs  Lab 04/06/19 0537 04/08/19 0647 04/11/19 0656  ALBUMIN 1.7* 1.6* 1.7*   No results for input(s): LIPASE, AMYLASE in the last 168 hours. No results for input(s): AMMONIA in the last 168 hours.  CBC: Recent Labs  Lab 04/05/19 1047 04/06/19 0537 04/08/19 0647 04/11/19 0656  WBC  8.8 7.7 8.1 10.7*  HGB 7.9* 8.2* 7.8* 7.4*  HCT 23.8* 24.8* 23.5* 22.4*  MCV 85.9 86.1 86.1 86.5  PLT 181 175 191 190    Cardiac Enzymes: No results for input(s): CKTOTAL, CKMB, CKMBINDEX, TROPONINI in the last 168 hours.  BNP (last 3 results) No results for input(s): BNP in the last 8760 hours.  ProBNP (last 3 results) No results for input(s): PROBNP in the last 8760 hours.  Radiological Exams: No results found.  Assessment/Plan Active Problems:   Acute on chronic respiratory failure with hypoxia (HCC)   Acute on chronic systolic and diastolic heart failure, NYHA class 3 (HCC)   Acute renal failure superimposed on stage 3 chronic kidney disease (HCC)   Healthcare-associated pneumonia   COPD, severe ([place])   1. Acute on chronic respiratory failure hypoxia patient will continue with full support on the ventilator was attempted at weaning today did not tolerate. 2. Acute on chronic systolic heart failure compensated 3. Acute on chronic renal failure followed by nephrology for dialysis 4. Healthcare associated pneumonia treated 5. Severe COPD at baseline nebulizers as necessary   I have personally seen and evaluated the patient, evaluated laboratory and imaging results, formulated the assessment and plan and placed orders. The Patient requires high complexity decision making  for assessment and support.  Case was discussed on Rounds with the Respiratory Therapy Staff  Allyne Gee, MD [facility] Pulmonary Critical Care Medicine Sleep Medicine

## 2019-04-13 DIAGNOSIS — I5043 Acute on chronic combined systolic (congestive) and diastolic (congestive) heart failure: Secondary | ICD-10-CM | POA: Diagnosis not present

## 2019-04-13 DIAGNOSIS — J449 Chronic obstructive pulmonary disease, unspecified: Secondary | ICD-10-CM | POA: Diagnosis not present

## 2019-04-13 DIAGNOSIS — J9621 Acute and chronic respiratory failure with hypoxia: Secondary | ICD-10-CM | POA: Diagnosis not present

## 2019-04-13 DIAGNOSIS — N179 Acute kidney failure, unspecified: Secondary | ICD-10-CM | POA: Diagnosis not present

## 2019-04-13 LAB — RENAL FUNCTION PANEL
Albumin: 1.8 g/dL — ABNORMAL LOW (ref 3.5–5.0)
Anion gap: 12 (ref 5–15)
BUN: 84 mg/dL — ABNORMAL HIGH (ref 8–23)
CO2: 25 mmol/L (ref 22–32)
Calcium: 8.9 mg/dL (ref 8.9–10.3)
Chloride: 97 mmol/L — ABNORMAL LOW (ref 98–111)
Creatinine, Ser: 2.84 mg/dL — ABNORMAL HIGH (ref 0.61–1.24)
GFR calc Af Amer: 25 mL/min — ABNORMAL LOW (ref >60)
GFR calc non Af Amer: 21 mL/min — ABNORMAL LOW (ref >60)
Glucose, Bld: 166 mg/dL — ABNORMAL HIGH (ref 70–99)
Phosphorus: 2.3 mg/dL — ABNORMAL LOW (ref 2.5–4.6)
Potassium: 3.8 mmol/L (ref 3.5–5.1)
Sodium: 134 mmol/L — ABNORMAL LOW (ref 135–145)

## 2019-04-13 LAB — CBC
HCT: 19.3 % — ABNORMAL LOW (ref 39.0–52.0)
Hemoglobin: 6.1 g/dL — CL (ref 13.0–17.0)
MCH: 27.7 pg (ref 26.0–34.0)
MCHC: 31.6 g/dL (ref 30.0–36.0)
MCV: 87.7 fL (ref 80.0–100.0)
Platelets: 172 10*3/uL (ref 150–400)
RBC: 2.2 MIL/uL — ABNORMAL LOW (ref 4.22–5.81)
RDW: 14.7 % (ref 11.5–15.5)
WBC: 8.1 10*3/uL (ref 4.0–10.5)
nRBC: 0 % (ref 0.0–0.2)

## 2019-04-13 LAB — HEMOGLOBIN AND HEMATOCRIT, BLOOD
HCT: 23.1 % — ABNORMAL LOW (ref 39.0–52.0)
Hemoglobin: 7.5 g/dL — ABNORMAL LOW (ref 13.0–17.0)

## 2019-04-13 LAB — PREPARE RBC (CROSSMATCH)

## 2019-04-13 NOTE — Progress Notes (Signed)
[facility]  ROUNDING NOTE   Subjective:  Patient due for hemodialysis treatment today. Still on the ventilator.   Wound healing  Vital signs in last 24 hours:  Temperature 97.7 pulse 96 respirations 29 blood pressure 167/50  Physical Exam: General: Critically ill-appearing  Head: Normocephalic, atraumatic. Moist oral mucosal membranes  Eyes: Anicteric  Neck: Tracheostomy in place  Lungs:  Scattered rhonchi, vent assisted  Heart: S1S2 no rubs  Abdomen:  Soft, nontender, bowel sounds present, PEG in place  Extremities: 2+ peripheral edema.  Neurologic: Not following commands  Skin: No rash  Access: Right internal jugular permcath    Basic Metabolic Panel: Recent Labs  Lab 04/08/19 0647 04/08/19 1500 04/11/19 0656 04/13/19 0620  NA 132*  --  132* 134*  K 4.3  --  3.9 3.8  CL 95*  --  96* 97*  CO2 25  --  24 25  GLUCOSE 174*  --  171* 166*  BUN 76*  --  96* 84*  CREATININE 2.71*  --  3.14* 2.84*  CALCIUM 8.7*  --  9.2 8.9  MG  --  2.0  --   --   PHOS 2.5  --  2.9 2.3*    Liver Function Tests: Recent Labs  Lab 04/08/19 0647 04/11/19 0656 04/13/19 0620  ALBUMIN 1.6* 1.7* 1.8*   No results for input(s): LIPASE, AMYLASE in the last 168 hours. No results for input(s): AMMONIA in the last 168 hours.  CBC: Recent Labs  Lab 04/08/19 0647 04/11/19 0656 04/13/19 0620  WBC 8.1 10.7* 8.1  HGB 7.8* 7.4* 6.1*  HCT 23.5* 22.4* 19.3*  MCV 86.1 86.5 87.7  PLT 191 190 172    Cardiac Enzymes: No results for input(s): CKTOTAL, CKMB, CKMBINDEX, TROPONINI in the last 168 hours.  BNP: Invalid input(s): POCBNP  CBG: No results for input(s): GLUCAP in the last 168 hours.  Microbiology: Results for orders placed or performed during the hospital encounter of 02/15/19  Culture, Urine     Status: None   Collection Time: 02/15/19 12:52 AM   Specimen: Urine, Random  Result Value Ref Range Status   Specimen Description URINE, RANDOM  Final   Special  Requests NONE  Final   Culture   Final    NO GROWTH Performed at [facility], [place] [address], [place], [place] [address]    Report Status 02/17/2019 FINAL  Final  Culture, respiratory (non-expectorated)     Status: None   Collection Time: 02/15/19 10:50 PM   Specimen: Tracheal Aspirate; Respiratory  Result Value Ref Range Status   Specimen Description TRACHEAL ASPIRATE  Final   Special Requests NONE  Final   Gram Stain   Final    NO WBC SEEN RARE GRAM POSITIVE COCCI IN PAIRS Performed at [facility], [address], [place], [place] [address]    Culture MODERATE ENTEROCOCCUS FAECALIS  Final   Report Status 02/19/2019 FINAL  Final   Organism ID, Bacteria ENTEROCOCCUS FAECALIS  Final      Susceptibility   Enterococcus faecalis - MIC*    AMPICILLIN <=2 SENSITIVE Sensitive     VANCOMYCIN 1 SENSITIVE Sensitive     GENTAMICIN SYNERGY RESISTANT Resistant     * MODERATE ENTEROCOCCUS FAECALIS  SARS Coronavirus 2 by RT PCR (hospital order, performed in [place] hospital lab) Nasopharyngeal Nasopharyngeal Swab     Status: None   Collection Time: 02/16/19  2:20 PM   Specimen: Nasopharyngeal Swab  Result Value Ref  Range Status   SARS Coronavirus 2 NEGATIVE NEGATIVE Final    Comment: (NOTE) If result is NEGATIVE SARS-CoV-2 target nucleic acids are NOT DETECTED. The SARS-CoV-2 RNA is generally detectable in upper and lower  respiratory specimens during the acute phase of infection. The lowest  concentration of SARS-CoV-2 viral copies this assay can detect is 250  copies / mL. A negative result does not preclude SARS-CoV-2 infection  and should not be used as the sole basis for treatment or other  patient management decisions.  A negative result may occur with  improper specimen collection / handling, submission of specimen other  than nasopharyngeal swab, presence of viral mutation(s) within the  areas targeted by this assay, and inadequate number of viral copies  (<250  copies / mL). A negative result must be combined with clinical  observations, patient history, and epidemiological information. If result is POSITIVE SARS-CoV-2 target nucleic acids are DETECTED. The SARS-CoV-2 RNA is generally detectable in upper and lower  respiratory specimens dur ing the acute phase of infection.  Positive  results are indicative of active infection with SARS-CoV-2.  Clinical  correlation with patient history and other diagnostic information is  necessary to determine patient infection status.  Positive results do  not rule out bacterial infection or co-infection with other viruses. If result is PRESUMPTIVE POSTIVE SARS-CoV-2 nucleic acids MAY BE PRESENT.   A presumptive positive result was obtained on the submitted specimen  and confirmed on repeat testing.  While 2019 novel coronavirus  (SARS-CoV-2) nucleic acids may be present in the submitted sample  additional confirmatory testing may be necessary for epidemiological  and / or clinical management purposes  to differentiate between  SARS-CoV-2 and other Sarbecovirus currently known to infect humans.  If clinically indicated additional testing with an alternate test  methodology [phone]) is advised. The SARS-CoV-2 RNA is generally  detectable in upper and lower respiratory sp ecimens during the acute  phase of infection. The expected result is Negative. Fact Sheet for Patients:  StrictlyIdeas.no Fact Sheet for Healthcare Providers: BankingDealers.co.za This test is not yet approved or cleared by the [place] FDA and has been authorized for detection and/or diagnosis of SARS-CoV-2 by FDA under an Emergency Use Authorization (EUA).  This EUA will remain in effect (meaning this test can be used) for the duration of the COVID-19 declaration under Section 564(b)(1) of the Act, 21 U.S.C. section 360bbb-3(b)(1), unless the authorization is terminated or revoked  sooner. Performed at [facility], [place] [address], [place], [place] [address]   SARS CORONAVIRUS 2 (TAT 6-24 HRS) Nasopharyngeal Nasopharyngeal Swab     Status: None   Collection Time: 03/07/19  7:40 AM   Specimen: Nasopharyngeal Swab  Result Value Ref Range Status   SARS Coronavirus 2 NEGATIVE NEGATIVE Final    Comment: (NOTE) SARS-CoV-2 target nucleic acids are NOT DETECTED. The SARS-CoV-2 RNA is generally detectable in upper and lower respiratory specimens during the acute phase of infection. Negative results do not preclude SARS-CoV-2 infection, do not rule out co-infections with other pathogens, and should not be used as the sole basis for treatment or other patient management decisions. Negative results must be combined with clinical observations, patient history, and epidemiological information. The expected result is Negative. Fact Sheet for Patients: SugarRoll.be Fact Sheet for Healthcare Providers: https://www.woods-mathews.com/ This test is not yet approved or cleared by the [place] FDA and  has been authorized for detection and/or diagnosis of SARS-CoV-2 by FDA under an Emergency Use Authorization (  EUA). This EUA will remain  in effect (meaning this test can be used) for the duration of the COVID-19 declaration under Section 56 4(b)(1) of the Act, 21 U.S.C. section 360bbb-3(b)(1), unless the authorization is terminated or revoked sooner. Performed at [facility], [place] [address], [place], [place] [address]   Stat Gram stain     Status: None   Collection Time: 03/11/19 12:46 PM   Specimen: Pleura; Body Fluid  Result Value Ref Range Status   Specimen Description PLEURAL LEFT  Final   Special Requests Normal  Final   Gram Stain   Final    NO WBC SEEN NO ORGANISMS SEEN Performed at [facility], [address], [place], [place] [address]    Report Status 03/11/2019 FINAL  Final  Gram stain      Status: None   Collection Time: 03/12/19 10:37 AM   Specimen: PATH Cytology Pleural fluid  Result Value Ref Range Status   Specimen Description PLEURAL RIGHT  Final   Special Requests NONE  Final   Gram Stain   Final    RARE WBC PRESENT, PREDOMINANTLY MONONUCLEAR NO ORGANISMS SEEN Performed at [facility], [place] [address] [place], [place] [address]    Report Status 03/12/2019 FINAL  Final  Culture, body fluid-bottle     Status: None   Collection Time: 03/12/19 10:37 AM   Specimen: Pleura  Result Value Ref Range Status   Specimen Description PLEURAL RIGHT  Final   Special Requests NONE  Final   Culture   Final    NO GROWTH 5 DAYS Performed at [place] [address] [place], [place] [address]    Report Status 03/17/2019 FINAL  Final    Coagulation Studies: No results for input(s): LABPROT, INR in the last 72 hours.  Urinalysis: No results for input(s): COLORURINE, LABSPEC, PHURINE, GLUCOSEU, HGBUR, BILIRUBINUR, KETONESUR, PROTEINUR, UROBILINOGEN, NITRITE, LEUKOCYTESUR in the last 72 hours.  Invalid input(s): APPERANCEUR    Imaging: No results found.   Medications:     iohexol  Assessment/ Plan:  73 y.o. male with a PMHx of BPH, allergic rhinitis, COPD, diabetes mellitus type 2, hypertension, GERD, acute respiratory failure, congestive heart failure, chronic kidney disease stage IIIb EGFR 43 who was admitted to [facility] on 02/15/2019 for ongoing management of acute respiratory failure.   1.    End-stage renal disease.  Patient due for hemodialysis treatment today.  Orders have been prepared.  2.  Acute respiratory failure.  Patient still on the ventilator and FiO2 currently 30% with a PEEP of 5.  Continue ventilatory support.  3.  Anemia of chronic kidney disease.  Hemoglobin down to 6.1.  Patient was transfused this a.m.  Continue to monitor CBC.  4.  Secondary hyperparathyroidism.  Phosphorus currently 2.3.  We will continue to monitor bone  mineral metabolism parameters.  LOS: 0 Davell Beckstead 12/9/202011:28 AM

## 2019-04-13 NOTE — Progress Notes (Signed)
Pulmonary Critical Care Medicine [place]   PULMONARY CRITICAL CARE SERVICE  PROGRESS NOTE  Date of Service: 04/13/2019  [name]  [name]  DOB: [birth year]   DOA: 02/15/2019  Referring Physician: [name], MD  HPI: Paul Ball is a 73 y.o. male seen for follow up of Acute on Chronic Respiratory Failure.  Patient currently is on full support on assist control mode has been on 30% FiO2 good saturations are noted at this time  Medications: Reviewed on Rounds  Physical Exam:  Vitals: Temperature 97.2 pulse 96 respiratory rate 29 blood pressure 167/50 saturations 98%  Ventilator Settings mode ventilation assist control FiO2 is 30% tidal volume 420 PEEP 5  . General: Comfortable at this time . Eyes: Grossly normal lids, irises & conjunctiva . ENT: grossly tongue is normal . Neck: no obvious mass . Cardiovascular: S1 S2 normal no gallop . Respiratory: No rhonchi no rales are noted at this time . Abdomen: soft . Skin: no rash seen on limited exam . Musculoskeletal: not rigid . Psychiatric:unable to assess . Neurologic: no seizure no involuntary movements         Lab Data:   Basic Metabolic Panel: Recent Labs  Lab 04/08/19 0647 04/08/19 1500 04/11/19 0656 04/13/19 0620  NA 132*  --  132* 134*  K 4.3  --  3.9 3.8  CL 95*  --  96* 97*  CO2 25  --  24 25  GLUCOSE 174*  --  171* 166*  BUN 76*  --  96* 84*  CREATININE 2.71*  --  3.14* 2.84*  CALCIUM 8.7*  --  9.2 8.9  MG  --  2.0  --   --   PHOS 2.5  --  2.9 2.3*    ABG: No results for input(s): PHART, PCO2ART, PO2ART, HCO3, O2SAT in the last 168 hours.  Liver Function Tests: Recent Labs  Lab 04/08/19 0647 04/11/19 0656 04/13/19 0620  ALBUMIN 1.6* 1.7* 1.8*   No results for input(s): LIPASE, AMYLASE in the last 168 hours. No results for input(s): AMMONIA in the last 168 hours.  CBC: Recent Labs  Lab 04/08/19 0647 04/11/19 0656 04/13/19 0620  WBC 8.1 10.7* 8.1  HGB  7.8* 7.4* 6.1*  HCT 23.5* 22.4* 19.3*  MCV 86.1 86.5 87.7  PLT 191 190 172    Cardiac Enzymes: No results for input(s): CKTOTAL, CKMB, CKMBINDEX, TROPONINI in the last 168 hours.  BNP (last 3 results) No results for input(s): BNP in the last 8760 hours.  ProBNP (last 3 results) No results for input(s): PROBNP in the last 8760 hours.  Radiological Exams: No results found.  Assessment/Plan Active Problems:   Acute on chronic respiratory failure with hypoxia (HCC)   Acute on chronic systolic and diastolic heart failure, NYHA class 3 (HCC)   Acute renal failure superimposed on stage 3 chronic kidney disease (HCC)   Healthcare-associated pneumonia   COPD, severe ([place])   1. Acute on chronic respiratory failure hypoxia plan is to continue with full support on assist control mode patient has been failing the RSB I mechanics have not been feasible for weaning 2. Acute on chronic systolic heart failure we will continue with supportive care 3. Acute renal failure on stage III kidney disease followed by nephrology for hemodialysis 4. Healthcare associated pneumonia treated 5. Severe COPD continue with supportive care nebulizers as needed   I have personally seen and evaluated the patient, evaluated laboratory and imaging results, formulated the assessment and plan and placed  orders. The Patient requires high complexity decision making for assessment and support.  Case was discussed on Rounds with the Respiratory Therapy Staff  Allyne Gee, MD [facility] Pulmonary Critical Care Medicine Sleep Medicine

## 2019-04-14 DIAGNOSIS — I5043 Acute on chronic combined systolic (congestive) and diastolic (congestive) heart failure: Secondary | ICD-10-CM | POA: Diagnosis not present

## 2019-04-14 DIAGNOSIS — J449 Chronic obstructive pulmonary disease, unspecified: Secondary | ICD-10-CM | POA: Diagnosis not present

## 2019-04-14 DIAGNOSIS — J9621 Acute and chronic respiratory failure with hypoxia: Secondary | ICD-10-CM | POA: Diagnosis not present

## 2019-04-14 DIAGNOSIS — N179 Acute kidney failure, unspecified: Secondary | ICD-10-CM | POA: Diagnosis not present

## 2019-04-14 LAB — CBC
HCT: 21 % — ABNORMAL LOW (ref 39.0–52.0)
HCT: 23.8 % — ABNORMAL LOW (ref 39.0–52.0)
Hemoglobin: 6.9 g/dL — CL (ref 13.0–17.0)
Hemoglobin: 7.9 g/dL — ABNORMAL LOW (ref 13.0–17.0)
MCH: 28.8 pg (ref 26.0–34.0)
MCH: 29.3 pg (ref 26.0–34.0)
MCHC: 32.9 g/dL (ref 30.0–36.0)
MCHC: 33.2 g/dL (ref 30.0–36.0)
MCV: 87.5 fL (ref 80.0–100.0)
MCV: 88.1 fL (ref 80.0–100.0)
Platelets: 166 10*3/uL (ref 150–400)
Platelets: 157 10*3/uL (ref 150–400)
RBC: 2.4 MIL/uL — ABNORMAL LOW (ref 4.22–5.81)
RBC: 2.7 MIL/uL — ABNORMAL LOW (ref 4.22–5.81)
RDW: 14.4 % (ref 11.5–15.5)
RDW: 14.3 % (ref 11.5–15.5)
WBC: 8.4 10*3/uL (ref 4.0–10.5)
WBC: 9.3 10*3/uL (ref 4.0–10.5)
nRBC: 0 % (ref 0.0–0.2)
nRBC: 0 % (ref 0.0–0.2)

## 2019-04-14 LAB — PREPARE RBC (CROSSMATCH)

## 2019-04-14 NOTE — Progress Notes (Signed)
Pulmonary Critical Care Medicine [place]   PULMONARY CRITICAL CARE SERVICE  PROGRESS NOTE  Date of Service: 04/14/2019  Paul Ball  [id number]  DOB: 03/22/1946   DOA: 02/15/2019  Referring Physician: [name], MD  HPI: Paul Ball is a 73 y.o. male seen for follow up of Acute on Chronic Respiratory Failure.  Patient is currently comfortable without distress has been on assist control mode patient's PEEP is at 5 at this time  Medications: Reviewed on Rounds  Physical Exam:  Vitals: Temperature 99.1 pulse 102 respiratory rate 34 blood pressure 95/67 saturations 98%  Ventilator Settings on assist control FiO2 is 35% tidal volume 420 PEEP 5  . General: Comfortable at this time . Eyes: Grossly normal lids, irises & conjunctiva . ENT: grossly tongue is normal . Neck: no obvious mass . Cardiovascular: S1 S2 normal no gallop . Respiratory: No rhonchi no rales are noted at this time . Abdomen: soft . Skin: no rash seen on limited exam . Musculoskeletal: not rigid . Psychiatric:unable to assess . Neurologic: no seizure no involuntary movements         Lab Data:   Basic Metabolic Panel: Recent Labs  Lab 04/08/19 0647 04/08/19 1500 04/11/19 0656 04/13/19 0620  NA 132*  --  132* 134*  K 4.3  --  3.9 3.8  CL 95*  --  96* 97*  CO2 25  --  24 25  GLUCOSE 174*  --  171* 166*  BUN 76*  --  96* 84*  CREATININE 2.71*  --  3.14* 2.84*  CALCIUM 8.7*  --  9.2 8.9  MG  --  2.0  --   --   PHOS 2.5  --  2.9 2.3*    ABG: No results for input(s): PHART, PCO2ART, PO2ART, HCO3, O2SAT in the last 168 hours.  Liver Function Tests: Recent Labs  Lab 04/08/19 0647 04/11/19 0656 04/13/19 0620  ALBUMIN 1.6* 1.7* 1.8*   No results for input(s): LIPASE, AMYLASE in the last 168 hours. No results for input(s): AMMONIA in the last 168 hours.  CBC: Recent Labs  Lab 04/08/19 0647 04/11/19 0656 04/13/19 0620 04/13/19 1818 04/14/19 0529 04/14/19 1200   WBC 8.1 10.7* 8.1  --  8.4 9.3  HGB 7.8* 7.4* 6.1* 7.5* 6.9* 7.9*  HCT 23.5* 22.4* 19.3* 23.1* 21.0* 23.8*  MCV 86.1 86.5 87.7  --  87.5 88.1  PLT 191 190 172  --  166 157    Cardiac Enzymes: No results for input(s): CKTOTAL, CKMB, CKMBINDEX, TROPONINI in the last 168 hours.  BNP (last 3 results) No results for input(s): BNP in the last 8760 hours.  ProBNP (last 3 results) No results for input(s): PROBNP in the last 8760 hours.  Radiological Exams: No results found.  Assessment/Plan Active Problems:   Acute on chronic respiratory failure with hypoxia (HCC)   Acute on chronic systolic and diastolic heart failure, NYHA class 3 (HCC)   Acute renal failure superimposed on stage 3 chronic kidney disease (HCC)   Healthcare-associated pneumonia   COPD, severe ([place])   1. Acute on chronic respiratory failure with hypoxia patient continues on assist control mode has been on 30% FiO2 with a PEEP of 5 2. Acute on chronic systolic and diastolic heart failure supportive care 3. Acute renal failure will continue to follow the labs patient is being followed by hemodialysis 4. Healthcare associated pneumonia treated we will continue with present management 5. Severe COPD at baseline   I have personally  seen and evaluated the patient, evaluated laboratory and imaging results, formulated the assessment and plan and placed orders. The Patient requires high complexity decision making for assessment and support.  Case was discussed on Rounds with the Respiratory Therapy Staff  Allyne Gee, MD [facility] Pulmonary Critical Care Medicine Sleep Medicine

## 2019-04-15 DIAGNOSIS — I5043 Acute on chronic combined systolic (congestive) and diastolic (congestive) heart failure: Secondary | ICD-10-CM | POA: Diagnosis not present

## 2019-04-15 DIAGNOSIS — N179 Acute kidney failure, unspecified: Secondary | ICD-10-CM | POA: Diagnosis not present

## 2019-04-15 DIAGNOSIS — J449 Chronic obstructive pulmonary disease, unspecified: Secondary | ICD-10-CM | POA: Diagnosis not present

## 2019-04-15 DIAGNOSIS — J9621 Acute and chronic respiratory failure with hypoxia: Secondary | ICD-10-CM | POA: Diagnosis not present

## 2019-04-15 LAB — RENAL FUNCTION PANEL
Albumin: 1.7 g/dL — ABNORMAL LOW (ref 3.5–5.0)
Anion gap: 11 (ref 5–15)
BUN: 69 mg/dL — ABNORMAL HIGH (ref 8–23)
CO2: 27 mmol/L (ref 22–32)
Calcium: 8.8 mg/dL — ABNORMAL LOW (ref 8.9–10.3)
Chloride: 96 mmol/L — ABNORMAL LOW (ref 98–111)
Creatinine, Ser: 2.57 mg/dL — ABNORMAL HIGH (ref 0.61–1.24)
GFR calc Af Amer: 28 mL/min — ABNORMAL LOW (ref >60)
GFR calc non Af Amer: 24 mL/min — ABNORMAL LOW (ref >60)
Glucose, Bld: 132 mg/dL — ABNORMAL HIGH (ref 70–99)
Phosphorus: 1.9 mg/dL — ABNORMAL LOW (ref 2.5–4.6)
Potassium: 3.8 mmol/L (ref 3.5–5.1)
Sodium: 134 mmol/L — ABNORMAL LOW (ref 135–145)

## 2019-04-15 LAB — CBC
HCT: 22.6 % — ABNORMAL LOW (ref 39.0–52.0)
Hemoglobin: 7.4 g/dL — ABNORMAL LOW (ref 13.0–17.0)
MCH: 29 pg (ref 26.0–34.0)
MCHC: 32.7 g/dL (ref 30.0–36.0)
MCV: 88.6 fL (ref 80.0–100.0)
Platelets: 163 10*3/uL (ref 150–400)
RBC: 2.55 MIL/uL — ABNORMAL LOW (ref 4.22–5.81)
RDW: 14.5 % (ref 11.5–15.5)
WBC: 9.2 10*3/uL (ref 4.0–10.5)
nRBC: 0 % (ref 0.0–0.2)

## 2019-04-15 LAB — PREPARE RBC (CROSSMATCH)

## 2019-04-15 NOTE — Progress Notes (Signed)
[facility]  ROUNDING NOTE   Subjective:  Patient seen and evaluated during dialysis treatment. Tolerating well. Patient received blood transfusion on 04/13/2019. Hemoglobin currently 7.4.   Wound healing  Vital signs in last 24 hours:  Temperature 97.2 pulse 78 respirations 34 blood pressure 129/49  Physical Exam: General: Critically ill-appearing  Head: Normocephalic, atraumatic. Moist oral mucosal membranes  Eyes: Anicteric  Neck: Tracheostomy in place  Lungs:  Scattered rhonchi, vent assisted  Heart: S1S2 no rubs  Abdomen:  Soft, nontender, bowel sounds present, PEG in place  Extremities: 2+ peripheral edema.  Neurologic: Not following commands  Skin: No rash  Access: Right internal jugular permcath    Basic Metabolic Panel: Recent Labs  Lab 04/08/19 1500 04/11/19 0656 04/13/19 0620 04/15/19 0456  NA  --  132* 134* 134*  K  --  3.9 3.8 3.8  CL  --  96* 97* 96*  CO2  --  24 25 27   GLUCOSE  --  171* 166* 132*  BUN  --  96* 84* 69*  CREATININE  --  3.14* 2.84* 2.57*  CALCIUM  --  9.2 8.9 8.8*  MG 2.0  --   --   --   PHOS  --  2.9 2.3* 1.9*    Liver Function Tests: Recent Labs  Lab 04/11/19 0656 04/13/19 0620 04/15/19 0456  ALBUMIN 1.7* 1.8* 1.7*   No results for input(s): LIPASE, AMYLASE in the last 168 hours. No results for input(s): AMMONIA in the last 168 hours.  CBC: Recent Labs  Lab 04/11/19 0656 04/13/19 0620 04/13/19 1818 04/14/19 0529 04/14/19 1200 04/15/19 0456  WBC 10.7* 8.1  --  8.4 9.3 9.2  HGB 7.4* 6.1* 7.5* 6.9* 7.9* 7.4*  HCT 22.4* 19.3* 23.1* 21.0* 23.8* 22.6*  MCV 86.5 87.7  --  87.5 88.1 88.6  PLT 190 172  --  166 157 163    Cardiac Enzymes: No results for input(s): CKTOTAL, CKMB, CKMBINDEX, TROPONINI in the last 168 hours.  BNP: Invalid input(s): POCBNP  CBG: No results for input(s): GLUCAP in the last 168 hours.  Microbiology: Results for orders placed or performed during the hospital encounter of  02/15/19  Culture, Urine     Status: None   Collection Time: 02/15/19 12:52 AM   Specimen: Urine, Random  Result Value Ref Range Status   Specimen Description URINE, RANDOM  Final   Special Requests NONE  Final   Culture   Final    NO GROWTH Performed at [facility], [place] [address] [place], [place] [address]    Report Status 02/17/2019 FINAL  Final  Culture, respiratory (non-expectorated)     Status: None   Collection Time: 02/15/19 10:50 PM   Specimen: Tracheal Aspirate; Respiratory  Result Value Ref Range Status   Specimen Description TRACHEAL ASPIRATE  Final   Special Requests NONE  Final   Gram Stain   Final    NO WBC SEEN RARE GRAM POSITIVE COCCI IN PAIRS Performed at [facility], [address], [place], [place] [address]    Culture MODERATE ENTEROCOCCUS FAECALIS  Final   Report Status 02/19/2019 FINAL  Final   Organism ID, Bacteria ENTEROCOCCUS FAECALIS  Final      Susceptibility   Enterococcus faecalis - MIC*    AMPICILLIN <=2 SENSITIVE Sensitive     VANCOMYCIN 1 SENSITIVE Sensitive     GENTAMICIN SYNERGY RESISTANT Resistant     * MODERATE ENTEROCOCCUS FAECALIS  SARS Coronavirus 2 by RT  PCR (hospital order, performed in [facility] hospital lab) Nasopharyngeal Nasopharyngeal Swab     Status: None   Collection Time: 02/16/19  2:20 PM   Specimen: Nasopharyngeal Swab  Result Value Ref Range Status   SARS Coronavirus 2 NEGATIVE NEGATIVE Final    Comment: (NOTE) If result is NEGATIVE SARS-CoV-2 target nucleic acids are NOT DETECTED. The SARS-CoV-2 RNA is generally detectable in upper and lower  respiratory specimens during the acute phase of infection. The lowest  concentration of SARS-CoV-2 viral copies this assay can detect is 250  copies / mL. A negative result does not preclude SARS-CoV-2 infection  and should not be used as the sole basis for treatment or other  patient management decisions.  A negative result may occur with  improper specimen  collection / handling, submission of specimen other  than nasopharyngeal swab, presence of viral mutation(s) within the  areas targeted by this assay, and inadequate number of viral copies  (<250 copies / mL). A negative result must be combined with clinical  observations, patient history, and epidemiological information. If result is POSITIVE SARS-CoV-2 target nucleic acids are DETECTED. The SARS-CoV-2 RNA is generally detectable in upper and lower  respiratory specimens dur ing the acute phase of infection.  Positive  results are indicative of active infection with SARS-CoV-2.  Clinical  correlation with patient history and other diagnostic information is  necessary to determine patient infection status.  Positive results do  not rule out bacterial infection or co-infection with other viruses. If result is PRESUMPTIVE POSTIVE SARS-CoV-2 nucleic acids MAY BE PRESENT.   A presumptive positive result was obtained on the submitted specimen  and confirmed on repeat testing.  While 2019 novel coronavirus  (SARS-CoV-2) nucleic acids may be present in the submitted sample  additional confirmatory testing may be necessary for epidemiological  and / or clinical management purposes  to differentiate between  SARS-CoV-2 and other Sarbecovirus currently known to infect humans.  If clinically indicated additional testing with an alternate test  methodology [phone]) is advised. The SARS-CoV-2 RNA is generally  detectable in upper and lower respiratory sp ecimens during the acute  phase of infection. The expected result is Negative. Fact Sheet for Patients:  StrictlyIdeas.no Fact Sheet for Healthcare Providers: BankingDealers.co.za This test is not yet approved or cleared by the [place] FDA and has been authorized for detection and/or diagnosis of SARS-CoV-2 by FDA under an Emergency Use Authorization (EUA).  This EUA will remain in effect  (meaning this test can be used) for the duration of the COVID-19 declaration under Section 564(b)(1) of the Act, 21 U.S.C. section 360bbb-3(b)(1), unless the authorization is terminated or revoked sooner. Performed at [facility], [place] [address] [place], [place] [address]   SARS CORONAVIRUS 2 (TAT 6-24 HRS) Nasopharyngeal Nasopharyngeal Swab     Status: None   Collection Time: 03/07/19  7:40 AM   Specimen: Nasopharyngeal Swab  Result Value Ref Range Status   SARS Coronavirus 2 NEGATIVE NEGATIVE Final    Comment: (NOTE) SARS-CoV-2 target nucleic acids are NOT DETECTED. The SARS-CoV-2 RNA is generally detectable in upper and lower respiratory specimens during the acute phase of infection. Negative results do not preclude SARS-CoV-2 infection, do not rule out co-infections with other pathogens, and should not be used as the sole basis for treatment or other patient management decisions. Negative results must be combined with clinical observations, patient history, and epidemiological information. The expected result is Negative. Fact Sheet for Patients: SugarRoll.be Fact Sheet  for Healthcare Providers: https://www.woods-mathews.com/ This test is not yet approved or cleared by the [place] and  has been authorized for detection and/or diagnosis of SARS-CoV-2 by FDA under an Emergency Use Authorization (EUA). This EUA will remain  in effect (meaning this test can be used) for the duration of the COVID-19 declaration under Section 56 4(b)(1) of the Act, 21 U.S.C. section 360bbb-3(b)(1), unless the authorization is terminated or revoked sooner. Performed at [facility], [place] [address] [place], [place] [address]   Stat Gram stain     Status: None   Collection Time: 03/11/19 12:46 PM   Specimen: Pleura; Body Fluid  Result Value Ref Range Status   Specimen Description PLEURAL LEFT  Final   Special Requests Normal  Final    Gram Stain   Final    NO WBC SEEN NO ORGANISMS SEEN Performed at [facility], [address] [place], [place] [address]    Report Status 03/11/2019 FINAL  Final  Gram stain     Status: None   Collection Time: 03/12/19 10:37 AM   Specimen: PATH Cytology Pleural fluid  Result Value Ref Range Status   Specimen Description PLEURAL RIGHT  Final   Special Requests NONE  Final   Gram Stain   Final    RARE WBC PRESENT, PREDOMINANTLY MONONUCLEAR NO ORGANISMS SEEN Performed at [facility], [place] [address], [place], [place] [address]    Report Status 03/12/2019 FINAL  Final  Culture, body fluid-bottle     Status: None   Collection Time: 03/12/19 10:37 AM   Specimen: Pleura  Result Value Ref Range Status   Specimen Description PLEURAL RIGHT  Final   Special Requests NONE  Final   Culture   Final    NO GROWTH 5 DAYS Performed at [place] [address] [place], [place] [address]    Report Status 03/17/2019 FINAL  Final    Coagulation Studies: No results for input(s): LABPROT, INR in the last 72 hours.  Urinalysis: No results for input(s): COLORURINE, LABSPEC, PHURINE, GLUCOSEU, HGBUR, BILIRUBINUR, KETONESUR, PROTEINUR, UROBILINOGEN, NITRITE, LEUKOCYTESUR in the last 72 hours.  Invalid input(s): APPERANCEUR    Imaging: No results found.   Medications:     iohexol  Assessment/ Plan:  73 y.o. male with a PMHx of BPH, allergic rhinitis, COPD, diabetes mellitus type 2, hypertension, GERD, acute respiratory failure, congestive heart failure, chronic kidney disease stage IIIb EGFR 43 who was admitted to [facility] on 02/15/2019 for ongoing management of acute respiratory failure.   1.    End-stage renal disease.  Patient seen and evaluated during dialysis treatment today.  Ultrafiltration target 2 kg.  Thereafter next scheduled treatment on Monday if still here.  2.  Acute respiratory failure.  Patient remains ventilator dependent.  Continue current  ventilatory support as per pulmonary/critical care.  3.  Anemia of chronic kidney disease.  Hemoglobin up to 7.4 posttransfusion.  Continue to monitor CBC.  4.  Secondary hyperparathyroidism.  Patient has not required binder therapy.  Most recent serum phosphorus was 1.9.  LOS: 0 Donnie Panik 12/11/20208:21 AM

## 2019-04-15 NOTE — Progress Notes (Signed)
Pulmonary Critical Care Medicine [place]   PULMONARY CRITICAL CARE SERVICE  PROGRESS NOTE  Date of Service: 04/15/2019  Mong Fuson  [name]  DOB: 08-Oct-1945   DOA: 02/15/2019  Referring Physician: [name], MD  HPI: Paul Ball is a 73 y.o. male seen for follow up of Acute on Chronic Respiratory Failure.  Patient currently is on assist control mode full support yesterday he did do pressure support needed to be encouraged and respiratory therapy is planning on doing it again today  Medications: Reviewed on Rounds  Physical Exam:  Vitals: Temperature 97.2 pulse 78 respiratory rate 38 blood pressure 129/49 saturations 100%  Ventilator Settings mode ventilation assist control FiO2 30% tidal volume 420 PEEP 5  . General: Comfortable at this time . Eyes: Grossly normal lids, irises & conjunctiva . ENT: grossly tongue is normal . Neck: no obvious mass . Cardiovascular: S1 S2 normal no gallop . Respiratory: No rhonchi no rales are noted at this time . Abdomen: soft . Skin: no rash seen on limited exam . Musculoskeletal: not rigid . Psychiatric:unable to assess . Neurologic: no seizure no involuntary movements         Lab Data:   Basic Metabolic Panel: Recent Labs  Lab 04/08/19 1500 04/11/19 0656 04/13/19 0620 04/15/19 0456  NA  --  132* 134* 134*  K  --  3.9 3.8 3.8  CL  --  96* 97* 96*  CO2  --  24 25 27   GLUCOSE  --  171* 166* 132*  BUN  --  96* 84* 69*  CREATININE  --  3.14* 2.84* 2.57*  CALCIUM  --  9.2 8.9 8.8*  MG 2.0  --   --   --   PHOS  --  2.9 2.3* 1.9*    ABG: No results for input(s): PHART, PCO2ART, PO2ART, HCO3, O2SAT in the last 168 hours.  Liver Function Tests: Recent Labs  Lab 04/11/19 0656 04/13/19 0620 04/15/19 0456  ALBUMIN 1.7* 1.8* 1.7*   No results for input(s): LIPASE, AMYLASE in the last 168 hours. No results for input(s): AMMONIA in the last 168 hours.  CBC: Recent Labs  Lab 04/11/19 0656  04/13/19 0620 04/13/19 1818 04/14/19 0529 04/14/19 1200 04/15/19 0456  WBC 10.7* 8.1  --  8.4 9.3 9.2  HGB 7.4* 6.1* 7.5* 6.9* 7.9* 7.4*  HCT 22.4* 19.3* 23.1* 21.0* 23.8* 22.6*  MCV 86.5 87.7  --  87.5 88.1 88.6  PLT 190 172  --  166 157 163    Cardiac Enzymes: No results for input(s): CKTOTAL, CKMB, CKMBINDEX, TROPONINI in the last 168 hours.  BNP (last 3 results) No results for input(s): BNP in the last 8760 hours.  ProBNP (last 3 results) No results for input(s): PROBNP in the last 8760 hours.  Radiological Exams: No results found.  Assessment/Plan Active Problems:   Acute on chronic respiratory failure with hypoxia (HCC)   Acute on chronic systolic and diastolic heart failure, NYHA class 3 (HCC)   Acute renal failure superimposed on stage 3 chronic kidney disease (HCC)   Healthcare-associated pneumonia   COPD, severe ([place])   1. Acute on chronic respiratory failure with hypoxia plan is to continue with full support on assist control mode patient is currently on 30% FiO2 with a PEEP of 5 2. Acute on chronic systolic and diastolic heart failure compensated 3. Acute renal failure on stage III kidney disease we will continue with current management 4. Healthcare associated pneumonia treated 5. Severe COPD  at baseline we will continue to follow along   I have personally seen and evaluated the patient, evaluated laboratory and imaging results, formulated the assessment and plan and placed orders. The Patient requires high complexity decision making for assessment and support.  Case was discussed on Rounds with the Respiratory Therapy Staff  Allyne Gee, MD [facility] Pulmonary Critical Care Medicine Sleep Medicine

## 2019-04-16 DIAGNOSIS — J449 Chronic obstructive pulmonary disease, unspecified: Secondary | ICD-10-CM | POA: Diagnosis not present

## 2019-04-16 DIAGNOSIS — N179 Acute kidney failure, unspecified: Secondary | ICD-10-CM | POA: Diagnosis not present

## 2019-04-16 DIAGNOSIS — I5043 Acute on chronic combined systolic (congestive) and diastolic (congestive) heart failure: Secondary | ICD-10-CM | POA: Diagnosis not present

## 2019-04-16 DIAGNOSIS — J9621 Acute and chronic respiratory failure with hypoxia: Secondary | ICD-10-CM | POA: Diagnosis not present

## 2019-04-16 LAB — TYPE AND SCREEN
ABO/RH(D): B POS
Antibody Screen: NEGATIVE
Unit division: 0
Unit division: 0
Unit division: 0

## 2019-04-16 LAB — CBC
HCT: 25 % — ABNORMAL LOW (ref 39.0–52.0)
Hemoglobin: 8.4 g/dL — ABNORMAL LOW (ref 13.0–17.0)
MCH: 29.6 pg (ref 26.0–34.0)
MCHC: 33.6 g/dL (ref 30.0–36.0)
MCV: 88 fL (ref 80.0–100.0)
Platelets: 152 10*3/uL (ref 150–400)
RBC: 2.84 MIL/uL — ABNORMAL LOW (ref 4.22–5.81)
RDW: 14.8 % (ref 11.5–15.5)
WBC: 10.2 10*3/uL (ref 4.0–10.5)
nRBC: 0 % (ref 0.0–0.2)

## 2019-04-16 LAB — BPAM RBC
Blood Product Expiration Date: 202101032359
Blood Product Expiration Date: 202101052359
Blood Product Expiration Date: 202101062359
ISSUE DATE / TIME: 202012091414
ISSUE DATE / TIME: 202012100806
ISSUE DATE / TIME: 202012110830
Unit Type and Rh: 7300
Unit Type and Rh: 7300
Unit Type and Rh: 7300

## 2019-04-16 NOTE — Progress Notes (Signed)
Pulmonary Critical Care Medicine [place]   PULMONARY CRITICAL CARE SERVICE  PROGRESS NOTE  Date of Service: 04/16/2019  Paul Ball  [id number]  DOB: 1946-03-07   DOA: 02/15/2019  Referring Physician: [name], MD  HPI: Paul Ball is a 73 y.o. male seen for follow up of Acute on Chronic Respiratory Failure.  At this time patient is comfortable without any distress noted.  Remains off the ventilator fully vent dependent at this time.  Medications: Reviewed on Rounds  Physical Exam:  Vitals: Temperature 98.4 pulse 86 respiratory 20 blood pressure 139/75 saturations 99%  Ventilator Settings mode of ventilation assist control FiO2 30% tidal line 436 PEEP 5  . General: Comfortable at this time . Eyes: Grossly normal lids, irises & conjunctiva . ENT: grossly tongue is normal . Neck: no obvious mass . Cardiovascular: S1 S2 normal no gallop . Respiratory: No rhonchi coarse breath sounds are noted . Abdomen: soft . Skin: no rash seen on limited exam . Musculoskeletal: not rigid . Psychiatric:unable to assess . Neurologic: no seizure no involuntary movements         Lab Data:   Basic Metabolic Panel: Recent Labs  Lab 04/11/19 0656 04/13/19 0620 04/15/19 0456  NA 132* 134* 134*  K 3.9 3.8 3.8  CL 96* 97* 96*  CO2 24 25 27   GLUCOSE 171* 166* 132*  BUN 96* 84* 69*  CREATININE 3.14* 2.84* 2.57*  CALCIUM 9.2 8.9 8.8*  PHOS 2.9 2.3* 1.9*    ABG: No results for input(s): PHART, PCO2ART, PO2ART, HCO3, O2SAT in the last 168 hours.  Liver Function Tests: Recent Labs  Lab 04/11/19 0656 04/13/19 0620 04/15/19 0456  ALBUMIN 1.7* 1.8* 1.7*   No results for input(s): LIPASE, AMYLASE in the last 168 hours. No results for input(s): AMMONIA in the last 168 hours.  CBC: Recent Labs  Lab 04/13/19 0620 04/13/19 1818 04/14/19 0529 04/14/19 1200 04/15/19 0456 04/16/19 0436  WBC 8.1  --  8.4 9.3 9.2 10.2  HGB 6.1* 7.5* 6.9* 7.9* 7.4* 8.4*   HCT 19.3* 23.1* 21.0* 23.8* 22.6* 25.0*  MCV 87.7  --  87.5 88.1 88.6 88.0  PLT 172  --  166 157 163 152    Cardiac Enzymes: No results for input(s): CKTOTAL, CKMB, CKMBINDEX, TROPONINI in the last 168 hours.  BNP (last 3 results) No results for input(s): BNP in the last 8760 hours.  ProBNP (last 3 results) No results for input(s): PROBNP in the last 8760 hours.  Radiological Exams: No results found.  Assessment/Plan Active Problems:   Acute on chronic respiratory failure with hypoxia (HCC)   Acute on chronic systolic and diastolic heart failure, NYHA class 3 (HCC)   Acute renal failure superimposed on stage 3 chronic kidney disease (HCC)   Healthcare-associated pneumonia   COPD, severe ([place])   1. Acute on chronic respiratory failure with hypoxia plan is to continue with full vent support.  Patient has failed weaning several times 2. Acute on chronic systolic and diastolic heart failure appears to be compensated continue with fluid management. 3. Acute renal failure per nephrology recommendations. 4. Healthcare associated pneumonia treated 5. Severe COPD at baseline continue present management   I have personally seen and evaluated the patient, evaluated laboratory and imaging results, formulated the assessment and plan and placed orders. The Patient requires high complexity decision making for assessment and support.  Case was discussed on Rounds with the Respiratory Therapy Staff  Allyne Gee, MD [facility] Pulmonary Critical Care  Medicine Sleep Medicine

## 2019-04-17 DIAGNOSIS — N179 Acute kidney failure, unspecified: Secondary | ICD-10-CM | POA: Diagnosis not present

## 2019-04-17 DIAGNOSIS — I5043 Acute on chronic combined systolic (congestive) and diastolic (congestive) heart failure: Secondary | ICD-10-CM | POA: Diagnosis not present

## 2019-04-17 DIAGNOSIS — J449 Chronic obstructive pulmonary disease, unspecified: Secondary | ICD-10-CM | POA: Diagnosis not present

## 2019-04-17 DIAGNOSIS — J9621 Acute and chronic respiratory failure with hypoxia: Secondary | ICD-10-CM | POA: Diagnosis not present

## 2019-04-17 NOTE — Progress Notes (Signed)
Pulmonary Critical Care Medicine [place]   PULMONARY CRITICAL CARE SERVICE  PROGRESS NOTE  Date of Service: 04/17/2019  Paul Ball  [id number]  DOB: 02-11-1946   DOA: 02/15/2019  Referring Physician: [name], MD  HPI: Paul Ball is a 73 y.o. male seen for follow up of Acute on Chronic Respiratory Failure.  Patient currently is on full support has not been weaning remains on assist control mode currently requiring 30% FiO2  Medications: Reviewed on Rounds  Physical Exam:  Vitals: Temperature 97.8 pulse 85 respiratory 22 blood pressure 123/59 saturations 97%  Ventilator Settings mode ventilation assist control FiO2 30% tidal volume is 420 PEEP 5  . General: Comfortable at this time . Eyes: Grossly normal lids, irises & conjunctiva . ENT: grossly tongue is normal . Neck: no obvious mass . Cardiovascular: S1 S2 normal no gallop . Respiratory: No rhonchi no rales are noted at this time . Abdomen: soft . Skin: no rash seen on limited exam . Musculoskeletal: not rigid . Psychiatric:unable to assess . Neurologic: no seizure no involuntary movements         Lab Data:   Basic Metabolic Panel: Recent Labs  Lab 04/11/19 0656 04/13/19 0620 04/15/19 0456  NA 132* 134* 134*  K 3.9 3.8 3.8  CL 96* 97* 96*  CO2 24 25 27   GLUCOSE 171* 166* 132*  BUN 96* 84* 69*  CREATININE 3.14* 2.84* 2.57*  CALCIUM 9.2 8.9 8.8*  PHOS 2.9 2.3* 1.9*    ABG: No results for input(s): PHART, PCO2ART, PO2ART, HCO3, O2SAT in the last 168 hours.  Liver Function Tests: Recent Labs  Lab 04/11/19 0656 04/13/19 0620 04/15/19 0456  ALBUMIN 1.7* 1.8* 1.7*   No results for input(s): LIPASE, AMYLASE in the last 168 hours. No results for input(s): AMMONIA in the last 168 hours.  CBC: Recent Labs  Lab 04/13/19 0620 04/13/19 1818 04/14/19 0529 04/14/19 1200 04/15/19 0456 04/16/19 0436  WBC 8.1  --  8.4 9.3 9.2 10.2  HGB 6.1* 7.5* 6.9* 7.9* 7.4* 8.4*  HCT  19.3* 23.1* 21.0* 23.8* 22.6* 25.0*  MCV 87.7  --  87.5 88.1 88.6 88.0  PLT 172  --  166 157 163 152    Cardiac Enzymes: No results for input(s): CKTOTAL, CKMB, CKMBINDEX, TROPONINI in the last 168 hours.  BNP (last 3 results) No results for input(s): BNP in the last 8760 hours.  ProBNP (last 3 results) No results for input(s): PROBNP in the last 8760 hours.  Radiological Exams: No results found.  Assessment/Plan Active Problems:   Acute on chronic respiratory failure with hypoxia (HCC)   Acute on chronic systolic and diastolic heart failure, NYHA class 3 (HCC)   Acute renal failure superimposed on stage 3 chronic kidney disease (HCC)   Healthcare-associated pneumonia   COPD, severe ([place])   1. Acute on chronic respiratory failure hypoxia plan is to continue to assess the RSB I however patient has been consistently failing any weaning attempts 2. Acute on chronic systolic heart failure we will continue to monitor fluid status 3. Acute renal failure followed by nephrology for dialysis 4. Healthcare associated pneumonia treated we will continue present management 5. Severe COPD at baseline nebulizers as necessary   I have personally seen and evaluated the patient, evaluated laboratory and imaging results, formulated the assessment and plan and placed orders. The Patient requires high complexity decision making for assessment and support.  Case was discussed on Rounds with the Respiratory Therapy Staff  Aayana Reinertsen A  Humphrey Rolls, MD [facility] Pulmonary Critical Care Medicine Sleep Medicine

## 2019-04-18 DIAGNOSIS — N179 Acute kidney failure, unspecified: Secondary | ICD-10-CM | POA: Diagnosis not present

## 2019-04-18 DIAGNOSIS — J449 Chronic obstructive pulmonary disease, unspecified: Secondary | ICD-10-CM | POA: Diagnosis not present

## 2019-04-18 DIAGNOSIS — J9621 Acute and chronic respiratory failure with hypoxia: Secondary | ICD-10-CM | POA: Diagnosis not present

## 2019-04-18 DIAGNOSIS — I5043 Acute on chronic combined systolic (congestive) and diastolic (congestive) heart failure: Secondary | ICD-10-CM | POA: Diagnosis not present

## 2019-04-18 LAB — RENAL FUNCTION PANEL
Albumin: 1.7 g/dL — ABNORMAL LOW (ref 3.5–5.0)
Anion gap: 10 (ref 5–15)
BUN: 95 mg/dL — ABNORMAL HIGH (ref 8–23)
CO2: 25 mmol/L (ref 22–32)
Calcium: 8.7 mg/dL — ABNORMAL LOW (ref 8.9–10.3)
Chloride: 94 mmol/L — ABNORMAL LOW (ref 98–111)
Creatinine, Ser: 3.21 mg/dL — ABNORMAL HIGH (ref 0.61–1.24)
GFR calc Af Amer: 21 mL/min — ABNORMAL LOW (ref >60)
GFR calc non Af Amer: 18 mL/min — ABNORMAL LOW (ref >60)
Glucose, Bld: 185 mg/dL — ABNORMAL HIGH (ref 70–99)
Phosphorus: 2.5 mg/dL (ref 2.5–4.6)
Potassium: 4.1 mmol/L (ref 3.5–5.1)
Sodium: 129 mmol/L — ABNORMAL LOW (ref 135–145)

## 2019-04-18 LAB — CBC
HCT: 25.1 % — ABNORMAL LOW (ref 39.0–52.0)
Hemoglobin: 8.5 g/dL — ABNORMAL LOW (ref 13.0–17.0)
MCH: 29.4 pg (ref 26.0–34.0)
MCHC: 33.9 g/dL (ref 30.0–36.0)
MCV: 86.9 fL (ref 80.0–100.0)
Platelets: 160 10*3/uL (ref 150–400)
RBC: 2.89 MIL/uL — ABNORMAL LOW (ref 4.22–5.81)
RDW: 15 % (ref 11.5–15.5)
WBC: 10.4 10*3/uL (ref 4.0–10.5)
nRBC: 0 % (ref 0.0–0.2)

## 2019-04-18 LAB — HEPATITIS B SURFACE ANTIGEN: Hepatitis B Surface Ag: NONREACTIVE

## 2019-04-18 NOTE — Progress Notes (Signed)
Pulmonary Critical Care Medicine [place]   PULMONARY CRITICAL CARE SERVICE  PROGRESS NOTE  Date of Service: 04/18/2019  Paul Ball  [id number]  DOB: Nov 29, 1945   DOA: 02/15/2019  Referring Physician: [name], MD  HPI: Paul Ball is a 73 y.o. male seen for follow up of Acute on Chronic Respiratory Failure.  Patient currently is on full support on assist control mode has been on 30% FiO2.  Currently is on a PEEP of 5  Medications: Reviewed on Rounds  Physical Exam:  Vitals: Temperature 96.6 pulse 80 respiratory 20 blood pressure 132/69 saturations 100%  Ventilator Settings mode of ventilation assist control FiO2 30% tidal volumes 420 PEEP 5  . General: Comfortable at this time . Eyes: Grossly normal lids, irises & conjunctiva . ENT: grossly tongue is normal . Neck: no obvious mass . Cardiovascular: S1 S2 normal no gallop . Respiratory: No rhonchi no rales are noted at this time . Abdomen: soft . Skin: no rash seen on limited exam . Musculoskeletal: not rigid . Psychiatric:unable to assess . Neurologic: no seizure no involuntary movements         Lab Data:   Basic Metabolic Panel: Recent Labs  Lab 04/13/19 0620 04/15/19 0456 04/18/19 0743  NA 134* 134* 129*  K 3.8 3.8 4.1  CL 97* 96* 94*  CO2 25 27 25   GLUCOSE 166* 132* 185*  BUN 84* 69* 95*  CREATININE 2.84* 2.57* 3.21*  CALCIUM 8.9 8.8* 8.7*  PHOS 2.3* 1.9* 2.5    ABG: No results for input(s): PHART, PCO2ART, PO2ART, HCO3, O2SAT in the last 168 hours.  Liver Function Tests: Recent Labs  Lab 04/13/19 0620 04/15/19 0456 04/18/19 0743  ALBUMIN 1.8* 1.7* 1.7*   No results for input(s): LIPASE, AMYLASE in the last 168 hours. No results for input(s): AMMONIA in the last 168 hours.  CBC: Recent Labs  Lab 04/14/19 0529 04/14/19 1200 04/15/19 0456 04/16/19 0436 04/18/19 0743  WBC 8.4 9.3 9.2 10.2 10.4  HGB 6.9* 7.9* 7.4* 8.4* 8.5*  HCT 21.0* 23.8* 22.6* 25.0*  25.1*  MCV 87.5 88.1 88.6 88.0 86.9  PLT 166 157 163 152 160    Cardiac Enzymes: No results for input(s): CKTOTAL, CKMB, CKMBINDEX, TROPONINI in the last 168 hours.  BNP (last 3 results) No results for input(s): BNP in the last 8760 hours.  ProBNP (last 3 results) No results for input(s): PROBNP in the last 8760 hours.  Radiological Exams: No results found.  Assessment/Plan Active Problems:   Acute on chronic respiratory failure with hypoxia (HCC)   Acute on chronic systolic and diastolic heart failure, NYHA class 3 (HCC)   Acute renal failure superimposed on stage 3 chronic kidney disease (HCC)   Healthcare-associated pneumonia   COPD, severe ([place])   1. Acute on chronic respiratory failure hypoxia plan continue with full support on the ventilator.  Patient has been basically failing any weaning attempts. 2. Acute on chronic systolic heart failure nephrology following along for the fluid issues 3. Acute renal failure on hemodialysis 4. Healthcare associated pneumonia treated 5. Severe COPD at baseline we will continue present management   I have personally seen and evaluated the patient, evaluated laboratory and imaging results, formulated the assessment and plan and placed orders. The Patient requires high complexity decision making for assessment and support.  Case was discussed on Rounds with the Respiratory Therapy Staff  Allyne Gee, MD [facility] Pulmonary Critical Care Medicine Sleep Medicine

## 2019-04-18 NOTE — Progress Notes (Signed)
[facility]  ROUNDING NOTE   Subjective:  Patient seen and evaluated bedside. Remains dialysis and vent dependent.   Wound healing  Vital signs in last 24 hours:  Temperature 96.6 pulse 80 respirations 20 blood pressure 152/64  Physical Exam: General: Critically ill-appearing  Head: Normocephalic, atraumatic. Moist oral mucosal membranes  Eyes: Anicteric  Neck: Tracheostomy in place  Lungs:  Scattered rhonchi, vent assisted  Heart: S1S2 no rubs  Abdomen:  Soft, nontender, bowel sounds present, PEG in place  Extremities: 2+ peripheral edema.  Neurologic: Not following commands  Skin: No rash  Access: Right internal jugular permcath    Basic Metabolic Panel: Recent Labs  Lab 04/13/19 0620 04/15/19 0456 04/18/19 0743  NA 134* 134* 129*  K 3.8 3.8 4.1  CL 97* 96* 94*  CO2 25 27 25   GLUCOSE 166* 132* 185*  BUN 84* 69* 95*  CREATININE 2.84* 2.57* 3.21*  CALCIUM 8.9 8.8* 8.7*  PHOS 2.3* 1.9* 2.5    Liver Function Tests: Recent Labs  Lab 04/13/19 0620 04/15/19 0456 04/18/19 0743  ALBUMIN 1.8* 1.7* 1.7*   No results for input(s): LIPASE, AMYLASE in the last 168 hours. No results for input(s): AMMONIA in the last 168 hours.  CBC: Recent Labs  Lab 04/14/19 0529 04/14/19 1200 04/15/19 0456 04/16/19 0436 04/18/19 0743  WBC 8.4 9.3 9.2 10.2 10.4  HGB 6.9* 7.9* 7.4* 8.4* 8.5*  HCT 21.0* 23.8* 22.6* 25.0* 25.1*  MCV 87.5 88.1 88.6 88.0 86.9  PLT 166 157 163 152 160    Cardiac Enzymes: No results for input(s): CKTOTAL, CKMB, CKMBINDEX, TROPONINI in the last 168 hours.  BNP: Invalid input(s): POCBNP  CBG: No results for input(s): GLUCAP in the last 168 hours.  Microbiology: Results for orders placed or performed during the hospital encounter of 02/15/19  Culture, Urine     Status: None   Collection Time: 02/15/19 12:52 AM   Specimen: Urine, Random  Result Value Ref Range Status   Specimen Description URINE, RANDOM  Final   Special  Requests NONE  Final   Culture   Final    NO GROWTH Performed at [facility], [place] [address], [place], [place] [address]    Report Status 02/17/2019 FINAL  Final  Culture, respiratory (non-expectorated)     Status: None   Collection Time: 02/15/19 10:50 PM   Specimen: Tracheal Aspirate; Respiratory  Result Value Ref Range Status   Specimen Description TRACHEAL ASPIRATE  Final   Special Requests NONE  Final   Gram Stain   Final    NO WBC SEEN RARE GRAM POSITIVE COCCI IN PAIRS Performed at [facility], [address], [place], [place] [address]    Culture MODERATE ENTEROCOCCUS FAECALIS  Final   Report Status 02/19/2019 FINAL  Final   Organism ID, Bacteria ENTEROCOCCUS FAECALIS  Final      Susceptibility   Enterococcus faecalis - MIC*    AMPICILLIN <=2 SENSITIVE Sensitive     VANCOMYCIN 1 SENSITIVE Sensitive     GENTAMICIN SYNERGY RESISTANT Resistant     * MODERATE ENTEROCOCCUS FAECALIS  SARS Coronavirus 2 by RT PCR (hospital order, performed in [facility] hospital lab) Nasopharyngeal Nasopharyngeal Swab     Status: None   Collection Time: 02/16/19  2:20 PM   Specimen: Nasopharyngeal Swab  Result Value Ref Range Status   SARS Coronavirus 2 NEGATIVE NEGATIVE Final    Comment: (NOTE) If result is NEGATIVE SARS-CoV-2 target nucleic acids are NOT DETECTED. The  SARS-CoV-2 RNA is generally detectable in upper and lower  respiratory specimens during the acute phase of infection. The lowest  concentration of SARS-CoV-2 viral copies this assay can detect is 250  copies / mL. A negative result does not preclude SARS-CoV-2 infection  and should not be used as the sole basis for treatment or other  patient management decisions.  A negative result may occur with  improper specimen collection / handling, submission of specimen other  than nasopharyngeal swab, presence of viral mutation(s) within the  areas targeted by this assay, and inadequate number of viral copies  (<250  copies / mL). A negative result must be combined with clinical  observations, patient history, and epidemiological information. If result is POSITIVE SARS-CoV-2 target nucleic acids are DETECTED. The SARS-CoV-2 RNA is generally detectable in upper and lower  respiratory specimens dur ing the acute phase of infection.  Positive  results are indicative of active infection with SARS-CoV-2.  Clinical  correlation with patient history and other diagnostic information is  necessary to determine patient infection status.  Positive results do  not rule out bacterial infection or co-infection with other viruses. If result is PRESUMPTIVE POSTIVE SARS-CoV-2 nucleic acids MAY BE PRESENT.   A presumptive positive result was obtained on the submitted specimen  and confirmed on repeat testing.  While 2019 novel coronavirus  (SARS-CoV-2) nucleic acids may be present in the submitted sample  additional confirmatory testing may be necessary for epidemiological  and / or clinical management purposes  to differentiate between  SARS-CoV-2 and other Sarbecovirus currently known to infect humans.  If clinically indicated additional testing with an alternate test  methodology [phone]) is advised. The SARS-CoV-2 RNA is generally  detectable in upper and lower respiratory sp ecimens during the acute  phase of infection. The expected result is Negative. Fact Sheet for Patients:  StrictlyIdeas.no Fact Sheet for Healthcare Providers: BankingDealers.co.za This test is not yet approved or cleared by the [place] FDA and has been authorized for detection and/or diagnosis of SARS-CoV-2 by FDA under an Emergency Use Authorization (EUA).  This EUA will remain in effect (meaning this test can be used) for the duration of the COVID-19 declaration under Section 564(b)(1) of the Act, 21 U.S.C. section 360bbb-3(b)(1), unless the authorization is terminated or revoked  sooner. Performed at [facility], [place] [address], [place], [place] [address]   SARS CORONAVIRUS 2 (TAT 6-24 HRS) Nasopharyngeal Nasopharyngeal Swab     Status: None   Collection Time: 03/07/19  7:40 AM   Specimen: Nasopharyngeal Swab  Result Value Ref Range Status   SARS Coronavirus 2 NEGATIVE NEGATIVE Final    Comment: (NOTE) SARS-CoV-2 target nucleic acids are NOT DETECTED. The SARS-CoV-2 RNA is generally detectable in upper and lower respiratory specimens during the acute phase of infection. Negative results do not preclude SARS-CoV-2 infection, do not rule out co-infections with other pathogens, and should not be used as the sole basis for treatment or other patient management decisions. Negative results must be combined with clinical observations, patient history, and epidemiological information. The expected result is Negative. Fact Sheet for Patients: SugarRoll.be Fact Sheet for Healthcare Providers: https://www.woods-mathews.com/ This test is not yet approved or cleared by the [place] FDA and  has been authorized for detection and/or diagnosis of SARS-CoV-2 by FDA under an Emergency Use Authorization (EUA). This EUA will remain  in effect (meaning this test can be used) for the duration of the COVID-19 declaration under Section 56 4(b)(1) of the  Act, 21 U.S.C. section 360bbb-3(b)(1), unless the authorization is terminated or revoked sooner. Performed at [facility], [place] [address] [place], [place] [address]   Stat Gram stain     Status: None   Collection Time: 03/11/19 12:46 PM   Specimen: Pleura; Body Fluid  Result Value Ref Range Status   Specimen Description PLEURAL LEFT  Final   Special Requests Normal  Final   Gram Stain   Final    NO WBC SEEN NO ORGANISMS SEEN Performed at [facility], [address], [place], [place] [address]    Report Status 03/11/2019 FINAL  Final  Gram stain     Status:  None   Collection Time: 03/12/19 10:37 AM   Specimen: PATH Cytology Pleural fluid  Result Value Ref Range Status   Specimen Description PLEURAL RIGHT  Final   Special Requests NONE  Final   Gram Stain   Final    RARE WBC PRESENT, PREDOMINANTLY MONONUCLEAR NO ORGANISMS SEEN Performed at [facility], [place] [address] [place], [place] [address]    Report Status 03/12/2019 FINAL  Final  Culture, body fluid-bottle     Status: None   Collection Time: 03/12/19 10:37 AM   Specimen: Pleura  Result Value Ref Range Status   Specimen Description PLEURAL RIGHT  Final   Special Requests NONE  Final   Culture   Final    NO GROWTH 5 DAYS Performed at [facility] [address], [place], [place] [address]    Report Status 03/17/2019 FINAL  Final    Coagulation Studies: No results for input(s): LABPROT, INR in the last 72 hours.  Urinalysis: No results for input(s): COLORURINE, LABSPEC, PHURINE, GLUCOSEU, HGBUR, BILIRUBINUR, KETONESUR, PROTEINUR, UROBILINOGEN, NITRITE, LEUKOCYTESUR in the last 72 hours.  Invalid input(s): APPERANCEUR    Imaging: No results found.   Medications:     iohexol  Assessment/ Plan:  73 y.o. male with a PMHx of BPH, allergic rhinitis, COPD, diabetes mellitus type 2, hypertension, GERD, acute respiratory failure, congestive heart failure, chronic kidney disease stage IIIb EGFR 43 who was admitted to [facility] on 02/15/2019 for ongoing management of acute respiratory failure.   1.    End-stage renal disease.  Patient is due for dialysis treatment today.  Orders have been prepared.  Continue dialysis on MWF schedule.  2.  Acute respiratory failure.  Patient still on the ventilator and requiring 30% FiO2 with a PEEP of 5.  Pulmonary/critical care monitoring.  3.  Anemia of chronic kidney disease.  Patient did receive blood transfusion last week.  Hemoglobin currently 8.5.  4.  Secondary hyperparathyroidism.  Phosphorus at target of 2.5.  Continue  to periodically monitor.  LOS: 0 Jaidev Sanger 12/14/20208:48 AM

## 2019-04-19 ENCOUNTER — Other Ambulatory Visit (HOSPITAL_COMMUNITY): Payer: Medicare Other

## 2019-04-19 DIAGNOSIS — J9621 Acute and chronic respiratory failure with hypoxia: Secondary | ICD-10-CM | POA: Diagnosis not present

## 2019-04-19 DIAGNOSIS — I5043 Acute on chronic combined systolic (congestive) and diastolic (congestive) heart failure: Secondary | ICD-10-CM | POA: Diagnosis not present

## 2019-04-19 DIAGNOSIS — J449 Chronic obstructive pulmonary disease, unspecified: Secondary | ICD-10-CM | POA: Diagnosis not present

## 2019-04-19 DIAGNOSIS — N179 Acute kidney failure, unspecified: Secondary | ICD-10-CM | POA: Diagnosis not present

## 2019-04-19 LAB — COMPREHENSIVE METABOLIC PANEL
ALT: 28 U/L (ref 0–44)
AST: 35 U/L (ref 15–41)
Albumin: 1.8 g/dL — ABNORMAL LOW (ref 3.5–5.0)
Alkaline Phosphatase: 567 U/L — ABNORMAL HIGH (ref 38–126)
Anion gap: 9 (ref 5–15)
BUN: 59 mg/dL — ABNORMAL HIGH (ref 8–23)
CO2: 27 mmol/L (ref 22–32)
Calcium: 8.5 mg/dL — ABNORMAL LOW (ref 8.9–10.3)
Chloride: 96 mmol/L — ABNORMAL LOW (ref 98–111)
Creatinine, Ser: 2.28 mg/dL — ABNORMAL HIGH (ref 0.61–1.24)
GFR calc Af Amer: 32 mL/min — ABNORMAL LOW (ref >60)
GFR calc non Af Amer: 28 mL/min — ABNORMAL LOW (ref >60)
Glucose, Bld: 150 mg/dL — ABNORMAL HIGH (ref 70–99)
Potassium: 3.6 mmol/L (ref 3.5–5.1)
Sodium: 132 mmol/L — ABNORMAL LOW (ref 135–145)
Total Bilirubin: 1.4 mg/dL — ABNORMAL HIGH (ref 0.3–1.2)
Total Protein: 6.3 g/dL — ABNORMAL LOW (ref 6.5–8.1)

## 2019-04-19 LAB — MAGNESIUM: Magnesium: 1.9 mg/dL (ref 1.7–2.4)

## 2019-04-19 LAB — PHOSPHORUS: Phosphorus: 1.5 mg/dL — ABNORMAL LOW (ref 2.5–4.6)

## 2019-04-19 NOTE — Progress Notes (Signed)
Pulmonary Critical Care Medicine [place]   PULMONARY CRITICAL CARE SERVICE  PROGRESS NOTE  Date of Service: 04/19/2019  Paul Ball  [id number]  DOB: [birth year]   DOA: 02/15/2019  Referring Physician: [name], MD  HPI: Paul Ball is a 73 y.o. male seen for follow up of Acute on Chronic Respiratory Failure.  Patient is on full support currently on assist control mode has been on 35% FiO2 attempted on pressure support with was not tolerating.  Medications: Reviewed on Rounds  Physical Exam:  Vitals: Temperature 99.8 pulse 93 respiratory 22 blood pressure 104/54 saturations 97%  Ventilator Settings mode ventilation assist control FiO2 35% tidal volume 420 PEEP 5  . General: Comfortable at this time . Eyes: Grossly normal lids, irises & conjunctiva . ENT: grossly tongue is normal . Neck: no obvious mass . Cardiovascular: S1 S2 normal no gallop . Respiratory: No rhonchi coarse breath sounds are noted . Abdomen: soft . Skin: no rash seen on limited exam . Musculoskeletal: not rigid . Psychiatric:unable to assess . Neurologic: no seizure no involuntary movements         Lab Data:   Basic Metabolic Panel: Recent Labs  Lab 04/13/19 0620 04/15/19 0456 04/18/19 0743 04/19/19 0523  NA 134* 134* 129* 132*  K 3.8 3.8 4.1 3.6  CL 97* 96* 94* 96*  CO2 25 27 25 27   GLUCOSE 166* 132* 185* 150*  BUN 84* 69* 95* 59*  CREATININE 2.84* 2.57* 3.21* 2.28*  CALCIUM 8.9 8.8* 8.7* 8.5*  MG  --   --   --  1.9  PHOS 2.3* 1.9* 2.5 1.5*    ABG: No results for input(s): PHART, PCO2ART, PO2ART, HCO3, O2SAT in the last 168 hours.  Liver Function Tests: Recent Labs  Lab 04/13/19 0620 04/15/19 0456 04/18/19 0743 04/19/19 0523  AST  --   --   --  35  ALT  --   --   --  28  ALKPHOS  --   --   --  567*  BILITOT  --   --   --  1.4*  PROT  --   --   --  6.3*  ALBUMIN 1.8* 1.7* 1.7* 1.8*   No results for input(s): LIPASE, AMYLASE in the last 168  hours. No results for input(s): AMMONIA in the last 168 hours.  CBC: Recent Labs  Lab 04/14/19 0529 04/14/19 1200 04/15/19 0456 04/16/19 0436 04/18/19 0743  WBC 8.4 9.3 9.2 10.2 10.4  HGB 6.9* 7.9* 7.4* 8.4* 8.5*  HCT 21.0* 23.8* 22.6* 25.0* 25.1*  MCV 87.5 88.1 88.6 88.0 86.9  PLT 166 157 163 152 160    Cardiac Enzymes: No results for input(s): CKTOTAL, CKMB, CKMBINDEX, TROPONINI in the last 168 hours.  BNP (last 3 results) No results for input(s): BNP in the last 8760 hours.  ProBNP (last 3 results) No results for input(s): PROBNP in the last 8760 hours.  Radiological Exams: DG CHEST PORT 1 VIEW  Result Date: 04/19/2019 CLINICAL DATA:  Central line placement. EXAM: PORTABLE CHEST 1 VIEW COMPARISON:  Chest x-ray 04/06/2019. FINDINGS: Tracheostomy tube and right IJ line stable position. Stable cardiomegaly. Diffuse bilateral pulmonary infiltrates/edema again noted without interim change. Small left pleural effusion again noted without interim change. No pneumothorax. IMPRESSION: 1.  Lines and tubes in stable position. 2.  Stable cardiomegaly. 3. Diffuse bilateral pulmonary infiltrates/edema again noted without interim change. Small left pleural effusion again noted without interim change. Electronically Signed   By: Marcello Moores  Register   On: 04/19/2019 11:13    Assessment/Plan Active Problems:   Acute on chronic respiratory failure with hypoxia (HCC)   Acute on chronic systolic and diastolic heart failure, NYHA class 3 (HCC)   Acute renal failure superimposed on stage 3 chronic kidney disease (HCC)   Healthcare-associated pneumonia   COPD, severe ([place])   1. Acute on chronic respiratory failure hypoxia plan is to continue with full support on assist control mode has been on 35% FiO2 has been attempted on pressure support but is not tolerating 2. Acute on chronic systolic and diastolic heart failure we will continue present management 3. Acute renal failure on chronic renal  failure supportive care will continue to follow 4. Healthcare associated pneumonia treated 5. Severe COPD at baseline   I have personally seen and evaluated the patient, evaluated laboratory and imaging results, formulated the assessment and plan and placed orders. The Patient requires high complexity decision making for assessment and support.  Case was discussed on Rounds with the Respiratory Therapy Staff  Allyne Gee, MD [facility] Pulmonary Critical Care Medicine Sleep Medicine

## 2019-04-20 DIAGNOSIS — J9621 Acute and chronic respiratory failure with hypoxia: Secondary | ICD-10-CM | POA: Diagnosis not present

## 2019-04-20 DIAGNOSIS — N179 Acute kidney failure, unspecified: Secondary | ICD-10-CM | POA: Diagnosis not present

## 2019-04-20 DIAGNOSIS — J449 Chronic obstructive pulmonary disease, unspecified: Secondary | ICD-10-CM | POA: Diagnosis not present

## 2019-04-20 DIAGNOSIS — I5043 Acute on chronic combined systolic (congestive) and diastolic (congestive) heart failure: Secondary | ICD-10-CM | POA: Diagnosis not present

## 2019-04-20 LAB — CBC
HCT: 21.8 % — ABNORMAL LOW (ref 39.0–52.0)
Hemoglobin: 7.1 g/dL — ABNORMAL LOW (ref 13.0–17.0)
MCH: 28.6 pg (ref 26.0–34.0)
MCHC: 32.6 g/dL (ref 30.0–36.0)
MCV: 87.9 fL (ref 80.0–100.0)
Platelets: 156 10*3/uL (ref 150–400)
RBC: 2.48 MIL/uL — ABNORMAL LOW (ref 4.22–5.81)
RDW: 14.9 % (ref 11.5–15.5)
WBC: 11.7 10*3/uL — ABNORMAL HIGH (ref 4.0–10.5)
nRBC: 0 % (ref 0.0–0.2)

## 2019-04-20 LAB — RENAL FUNCTION PANEL
Albumin: 1.7 g/dL — ABNORMAL LOW (ref 3.5–5.0)
Anion gap: 11 (ref 5–15)
BUN: 81 mg/dL — ABNORMAL HIGH (ref 8–23)
CO2: 27 mmol/L (ref 22–32)
Calcium: 8.6 mg/dL — ABNORMAL LOW (ref 8.9–10.3)
Chloride: 93 mmol/L — ABNORMAL LOW (ref 98–111)
Creatinine, Ser: 2.96 mg/dL — ABNORMAL HIGH (ref 0.61–1.24)
GFR calc Af Amer: 23 mL/min — ABNORMAL LOW (ref >60)
GFR calc non Af Amer: 20 mL/min — ABNORMAL LOW (ref >60)
Glucose, Bld: 146 mg/dL — ABNORMAL HIGH (ref 70–99)
Phosphorus: 1.9 mg/dL — ABNORMAL LOW (ref 2.5–4.6)
Potassium: 3.9 mmol/L (ref 3.5–5.1)
Sodium: 131 mmol/L — ABNORMAL LOW (ref 135–145)

## 2019-04-20 LAB — PREPARE RBC (CROSSMATCH)

## 2019-04-20 LAB — HEPATITIS B SURFACE ANTIGEN: Hepatitis B Surface Ag: NONREACTIVE

## 2019-04-20 NOTE — Progress Notes (Signed)
[facility]  ROUNDING NOTE   Subjective:  Patient seen and evaluated during hemodialysis. Appears to be tolerating well so far.    Wound healing  Vital signs in last 24 hours:  Temperature 99.1 pulse 92 respirations 21 blood pressure 127/46  Physical Exam: General: Critically ill-appearing  Head: Normocephalic, atraumatic. Moist oral mucosal membranes  Eyes: Anicteric  Neck: Tracheostomy in place  Lungs:  Scattered rhonchi, vent assisted  Heart: S1S2 no rubs  Abdomen:  Soft, nontender, bowel sounds present, PEG in place  Extremities: 2+ peripheral edema.  Neurologic: Not following commands  Skin: No rash  Access: Right internal jugular permcath    Basic Metabolic Panel: Recent Labs  Lab 04/15/19 0456 04/18/19 0743 04/19/19 0523 04/20/19 0543  NA 134* 129* 132* 131*  K 3.8 4.1 3.6 3.9  CL 96* 94* 96* 93*  CO2 _0 GLUCOSE 132* 185* 150* 146*  BUN 69* 95* 59* 81*  CREATININE 2.57* 3.21* 2.28* 2.96*  CALCIUM 8.8* 8.7* 8.5* 8.6*  MG  --   --  1.9  --   PHOS 1.9* 2.5 1.5* 1.9*    Liver Function Tests: Recent Labs  Lab 04/15/19 0456 04/18/19 0743 04/19/19 0523 04/20/19 0543  AST  --   --  35  --   ALT  --   --  28  --   ALKPHOS  --   --  567*  --   BILITOT  --   --  1.4*  --   PROT  --   --  6.3*  --   ALBUMIN 1.7* 1.7* 1.8* 1.7*   No results for input(s): LIPASE, AMYLASE in the last 168 hours. No results for input(s): AMMONIA in the last 168 hours.  CBC: Recent Labs  Lab 04/14/19 1200 04/15/19 0456 04/16/19 0436 04/18/19 0743 04/20/19 0543  WBC 9.3 9.2 10.2 10.4 11.7*  HGB 7.9* 7.4* 8.4* 8.5* 7.1*  HCT 23.8* 22.6* 25.0* 25.1* 21.8*  MCV 88.1 88.6 88.0 86.9 87.9  PLT 157 163 152 160 156    Cardiac Enzymes: No results for input(s): CKTOTAL, CKMB, CKMBINDEX, TROPONINI in the last 168 hours.  BNP: Invalid input(s): POCBNP  CBG: No results for input(s): GLUCAP in the last 168 hours.  Microbiology: Results for orders  placed or performed during the hospital encounter of 02/15/19  Culture, Urine     Status: None   Collection Time: 02/15/19 12:52 AM   Specimen: Urine, Random  Result Value Ref Range Status   Specimen Description URINE, RANDOM  Final   Special Requests NONE  Final   Culture   Final    NO GROWTH Performed at [facility], [place] [address], [place], [place] [address]    Report Status 02/17/2019 FINAL  Final  Culture, respiratory (non-expectorated)     Status: None   Collection Time: 02/15/19 10:50 PM   Specimen: Tracheal Aspirate; Respiratory  Result Value Ref Range Status   Specimen Description TRACHEAL ASPIRATE  Final   Special Requests NONE  Final   Gram Stain   Final    NO WBC SEEN RARE GRAM POSITIVE COCCI IN PAIRS Performed at [facility], [address], [place], [place] [address]    Culture MODERATE ENTEROCOCCUS FAECALIS  Final   Report Status 02/19/2019 FINAL  Final   Organism ID, Bacteria ENTEROCOCCUS FAECALIS  Final      Susceptibility   Enterococcus faecalis - MIC*    AMPICILLIN <=2 SENSITIVE Sensitive  VANCOMYCIN 1 SENSITIVE Sensitive     GENTAMICIN SYNERGY RESISTANT Resistant     * MODERATE ENTEROCOCCUS FAECALIS  SARS Coronavirus 2 by RT PCR (hospital order, performed in [facility] hospital lab) Nasopharyngeal Nasopharyngeal Swab     Status: None   Collection Time: 02/16/19  2:20 PM   Specimen: Nasopharyngeal Swab  Result Value Ref Range Status   SARS Coronavirus 2 NEGATIVE NEGATIVE Final    Comment: (NOTE) If result is NEGATIVE SARS-CoV-2 target nucleic acids are NOT DETECTED. The SARS-CoV-2 RNA is generally detectable in upper and lower  respiratory specimens during the acute phase of infection. The lowest  concentration of SARS-CoV-2 viral copies this assay can detect is 250  copies / mL. A negative result does not preclude SARS-CoV-2 infection  and should not be used as the sole basis for treatment or other  patient management decisions.  A  negative result may occur with  improper specimen collection / handling, submission of specimen other  than nasopharyngeal swab, presence of viral mutation(s) within the  areas targeted by this assay, and inadequate number of viral copies  (<250 copies / mL). A negative result must be combined with clinical  observations, patient history, and epidemiological information. If result is POSITIVE SARS-CoV-2 target nucleic acids are DETECTED. The SARS-CoV-2 RNA is generally detectable in upper and lower  respiratory specimens dur ing the acute phase of infection.  Positive  results are indicative of active infection with SARS-CoV-2.  Clinical  correlation with patient history and other diagnostic information is  necessary to determine patient infection status.  Positive results do  not rule out bacterial infection or co-infection with other viruses. If result is PRESUMPTIVE POSTIVE SARS-CoV-2 nucleic acids MAY BE PRESENT.   A presumptive positive result was obtained on the submitted specimen  and confirmed on repeat testing.  While 2019 novel coronavirus  (SARS-CoV-2) nucleic acids may be present in the submitted sample  additional confirmatory testing may be necessary for epidemiological  and / or clinical management purposes  to differentiate between  SARS-CoV-2 and other Sarbecovirus currently known to infect humans.  If clinically indicated additional testing with an alternate test  methodology [phone]) is advised. The SARS-CoV-2 RNA is generally  detectable in upper and lower respiratory sp ecimens during the acute  phase of infection. The expected result is Negative. Fact Sheet for Patients:  StrictlyIdeas.no Fact Sheet for Healthcare Providers: BankingDealers.co.za This test is not yet approved or cleared by the [place] FDA and has been authorized for detection and/or diagnosis of SARS-CoV-2 by FDA under an Emergency Use  Authorization (EUA).  This EUA will remain in effect (meaning this test can be used) for the duration of the COVID-19 declaration under Section 564(b)(1) of the Act, 21 U.S.C. section 360bbb-3(b)(1), unless the authorization is terminated or revoked sooner. Performed at [facility], [place] [address], [place], [place] [address]   SARS CORONAVIRUS 2 (TAT 6-24 HRS) Nasopharyngeal Nasopharyngeal Swab     Status: None   Collection Time: 03/07/19  7:40 AM   Specimen: Nasopharyngeal Swab  Result Value Ref Range Status   SARS Coronavirus 2 NEGATIVE NEGATIVE Final    Comment: (NOTE) SARS-CoV-2 target nucleic acids are NOT DETECTED. The SARS-CoV-2 RNA is generally detectable in upper and lower respiratory specimens during the acute phase of infection. Negative results do not preclude SARS-CoV-2 infection, do not rule out co-infections with other pathogens, and should not be used as the sole basis for treatment or other patient management  decisions. Negative results must be combined with clinical observations, patient history, and epidemiological information. The expected result is Negative. Fact Sheet for Patients: SugarRoll.be Fact Sheet for Healthcare Providers: https://www.woods-mathews.com/ This test is not yet approved or cleared by the [place] FDA and  has been authorized for detection and/or diagnosis of SARS-CoV-2 by FDA under an Emergency Use Authorization (EUA). This EUA will remain  in effect (meaning this test can be used) for the duration of the COVID-19 declaration under Section 56 4(b)(1) of the Act, 21 U.S.C. section 360bbb-3(b)(1), unless the authorization is terminated or revoked sooner. Performed at [facility], [place] [address] [place], [place] [address]   Stat Gram stain     Status: None   Collection Time: 03/11/19 12:46 PM   Specimen: Pleura; Body Fluid  Result Value Ref Range Status   Specimen Description  PLEURAL LEFT  Final   Special Requests Normal  Final   Gram Stain   Final    NO WBC SEEN NO ORGANISMS SEEN Performed at [facility], [address] [place], [place] [address]    Report Status 03/11/2019 FINAL  Final  Gram stain     Status: None   Collection Time: 03/12/19 10:37 AM   Specimen: PATH Cytology Pleural fluid  Result Value Ref Range Status   Specimen Description PLEURAL RIGHT  Final   Special Requests NONE  Final   Gram Stain   Final    RARE WBC PRESENT, PREDOMINANTLY MONONUCLEAR NO ORGANISMS SEEN Performed at [facility], [place] [address], [place], [place] [address]    Report Status 03/12/2019 FINAL  Final  Culture, body fluid-bottle     Status: None   Collection Time: 03/12/19 10:37 AM   Specimen: Pleura  Result Value Ref Range Status   Specimen Description PLEURAL RIGHT  Final   Special Requests NONE  Final   Culture   Final    NO GROWTH 5 DAYS Performed at [place] [address], [place],  [address]    Report Status 03/17/2019 FINAL  Final    Coagulation Studies: No results for input(s): LABPROT, INR in the last 72 hours.  Urinalysis: No results for input(s): COLORURINE, LABSPEC, PHURINE, GLUCOSEU, HGBUR, BILIRUBINUR, KETONESUR, PROTEINUR, UROBILINOGEN, NITRITE, LEUKOCYTESUR in the last 72 hours.  Invalid input(s): APPERANCEUR    Imaging: DG CHEST PORT 1 VIEW  Result Date: 04/19/2019 CLINICAL DATA:  Central line placement. EXAM: PORTABLE CHEST 1 VIEW COMPARISON:  Chest x-ray 04/06/2019. FINDINGS: Tracheostomy tube and right IJ line stable position. Stable cardiomegaly. Diffuse bilateral pulmonary infiltrates/edema again noted without interim change. Small left pleural effusion again noted without interim change. No pneumothorax. IMPRESSION: 1.  Lines and tubes in stable position. 2.  Stable cardiomegaly. 3. Diffuse bilateral pulmonary infiltrates/edema again noted without interim change. Small left pleural effusion again  noted without interim change. Electronically Signed   By: Marcello Moores  Register   On: 04/19/2019 11:13     Medications:     iohexol  Assessment/ Plan:  73 y.o. male with a PMHx of BPH, allergic rhinitis, COPD, diabetes mellitus type 2, hypertension, GERD, acute respiratory failure, congestive heart failure, chronic kidney disease stage IIIb EGFR 43 who was admitted to [facility] on 02/15/2019 for ongoing management of acute respiratory failure.   1.    End-stage renal disease.  Patient seen and evaluated during dialysis treatment.  Tolerating well.  We plan to complete dialysis treatment today.  Periodically requires albumin support.  2.  Acute respiratory failure.  Maintain the patient on ventilatory support.  He has been difficult to wean from the ventilator.  3.  Anemia of chronic kidney disease.  Hemoglobin dropped to 7.1.  Consider blood transfusion for hemoglobin of 7 or less.  Defer to hospitalist.  4.  Secondary hyperparathyroidism.  Phosphorus currently 1.9.  A bit higher than last treatment.  Continue to monitor closely.  LOS: 0 Andres Escandon 12/16/202011:58 AM

## 2019-04-20 NOTE — Progress Notes (Signed)
Pulmonary Critical Care Medicine [facility]   PULMONARY CRITICAL CARE SERVICE  PROGRESS NOTE  Date of Service: 04/20/2019  Paul Ball  [id number]  DOB: 08/04/1945   DOA: 02/15/2019  Referring Physician: [name], MD  HPI: Paul Ball is a 73 y.o. male seen for follow up of Acute on Chronic Respiratory Failure.  Patient currently is on full support on assist control mode has been on 30% FiO2 with a PEEP of 5  Medications: Reviewed on Rounds  Physical Exam:  Vitals: Temperature 99.1 pulse 92 respiratory 26 blood pressure 127/46 saturations 98%  Ventilator Settings mode ventilation assist control FiO2 30% tidal volume 420 PEEP 5  . General: Comfortable at this time . Eyes: Grossly normal lids, irises & conjunctiva . ENT: grossly tongue is normal . Neck: no obvious mass . Cardiovascular: S1 S2 normal no gallop . Respiratory: No rhonchi coarse breath sounds are noted at this time . Abdomen: soft . Skin: no rash seen on limited exam . Musculoskeletal: not rigid . Psychiatric:unable to assess . Neurologic: no seizure no involuntary movements         Lab Data:   Basic Metabolic Panel: Recent Labs  Lab 04/15/19 0456 04/18/19 0743 04/19/19 0523 04/20/19 0543  NA 134* 129* 132* 131*  K 3.8 4.1 3.6 3.9  CL 96* 94* 96* 93*  CO2 27 25 27 27   GLUCOSE 132* 185* 150* 146*  BUN 69* 95* 59* 81*  CREATININE 2.57* 3.21* 2.28* 2.96*  CALCIUM 8.8* 8.7* 8.5* 8.6*  MG  --   --  1.9  --   PHOS 1.9* 2.5 1.5* 1.9*    ABG: No results for input(s): PHART, PCO2ART, PO2ART, HCO3, O2SAT in the last 168 hours.  Liver Function Tests: Recent Labs  Lab 04/15/19 0456 04/18/19 0743 04/19/19 0523 04/20/19 0543  AST  --   --  35  --   ALT  --   --  28  --   ALKPHOS  --   --  567*  --   BILITOT  --   --  1.4*  --   PROT  --   --  6.3*  --   ALBUMIN 1.7* 1.7* 1.8* 1.7*   No results for input(s): LIPASE, AMYLASE in the last 168 hours. No results for  input(s): AMMONIA in the last 168 hours.  CBC: Recent Labs  Lab 04/14/19 1200 04/15/19 0456 04/16/19 0436 04/18/19 0743 04/20/19 0543  WBC 9.3 9.2 10.2 10.4 11.7*  HGB 7.9* 7.4* 8.4* 8.5* 7.1*  HCT 23.8* 22.6* 25.0* 25.1* 21.8*  MCV 88.1 88.6 88.0 86.9 87.9  PLT 157 163 152 160 156    Cardiac Enzymes: No results for input(s): CKTOTAL, CKMB, CKMBINDEX, TROPONINI in the last 168 hours.  BNP (last 3 results) No results for input(s): BNP in the last 8760 hours.  ProBNP (last 3 results) No results for input(s): PROBNP in the last 8760 hours.  Radiological Exams: DG CHEST PORT 1 VIEW  Result Date: 04/19/2019 CLINICAL DATA:  Central line placement. EXAM: PORTABLE CHEST 1 VIEW COMPARISON:  Chest x-ray 04/06/2019. FINDINGS: Tracheostomy tube and right IJ line stable position. Stable cardiomegaly. Diffuse bilateral pulmonary infiltrates/edema again noted without interim change. Small left pleural effusion again noted without interim change. No pneumothorax. IMPRESSION: 1.  Lines and tubes in stable position. 2.  Stable cardiomegaly. 3. Diffuse bilateral pulmonary infiltrates/edema again noted without interim change. Small left pleural effusion again noted without interim change. Electronically Signed   By: Marcello Moores  Register   On: 04/19/2019 11:13    Assessment/Plan Active Problems:   Acute on chronic respiratory failure with hypoxia (HCC)   Acute on chronic systolic and diastolic heart failure, NYHA class 3 (HCC)   Acute renal failure superimposed on stage 3 chronic kidney disease (HCC)   Healthcare-associated pneumonia   COPD, severe ([place])   1. Acute on chronic respiratory failure hypoxia patient continues on full support assist control mode not able to do any weaning 2. Acute on chronic systolic heart failure compensated 3. Acute renal failure followed by nephrology 4. Healthcare associated pneumonia treated 5. Severe COPD at baseline   I have personally seen and evaluated the  patient, evaluated laboratory and imaging results, formulated the assessment and plan and placed orders. The Patient requires high complexity decision making for assessment and support.  Case was discussed on Rounds with the Respiratory Therapy Staff  Allyne Gee, MD [facility] Pulmonary Critical Care Medicine Sleep Medicine

## 2019-04-21 ENCOUNTER — Ambulatory Visit: Payer: Medicare Other | Admitting: Gastroenterology

## 2019-04-21 LAB — CBC
HCT: 25.1 % — ABNORMAL LOW (ref 39.0–52.0)
Hemoglobin: 8.4 g/dL — ABNORMAL LOW (ref 13.0–17.0)
MCH: 29.3 pg (ref 26.0–34.0)
MCHC: 33.5 g/dL (ref 30.0–36.0)
MCV: 87.5 fL (ref 80.0–100.0)
Platelets: 159 10*3/uL (ref 150–400)
RBC: 2.87 MIL/uL — ABNORMAL LOW (ref 4.22–5.81)
RDW: 14.8 % (ref 11.5–15.5)
WBC: 8.6 10*3/uL (ref 4.0–10.5)
nRBC: 0 % (ref 0.0–0.2)

## 2019-04-21 LAB — TYPE AND SCREEN
ABO/RH(D): B POS
Antibody Screen: NEGATIVE
Unit division: 0

## 2019-04-21 LAB — BPAM RBC
Blood Product Expiration Date: 202101102359
ISSUE DATE / TIME: 202012161326
Unit Type and Rh: 7300

## 2020-08-08 IMAGING — DX DG CHEST 1V PORT
1 series · 1 of 1 positions shown · non-contrast
Comparison: February 21, 2019.

CLINICAL DATA: Pneumonia.

EXAM:
PORTABLE CHEST 1 VIEW

[chest]
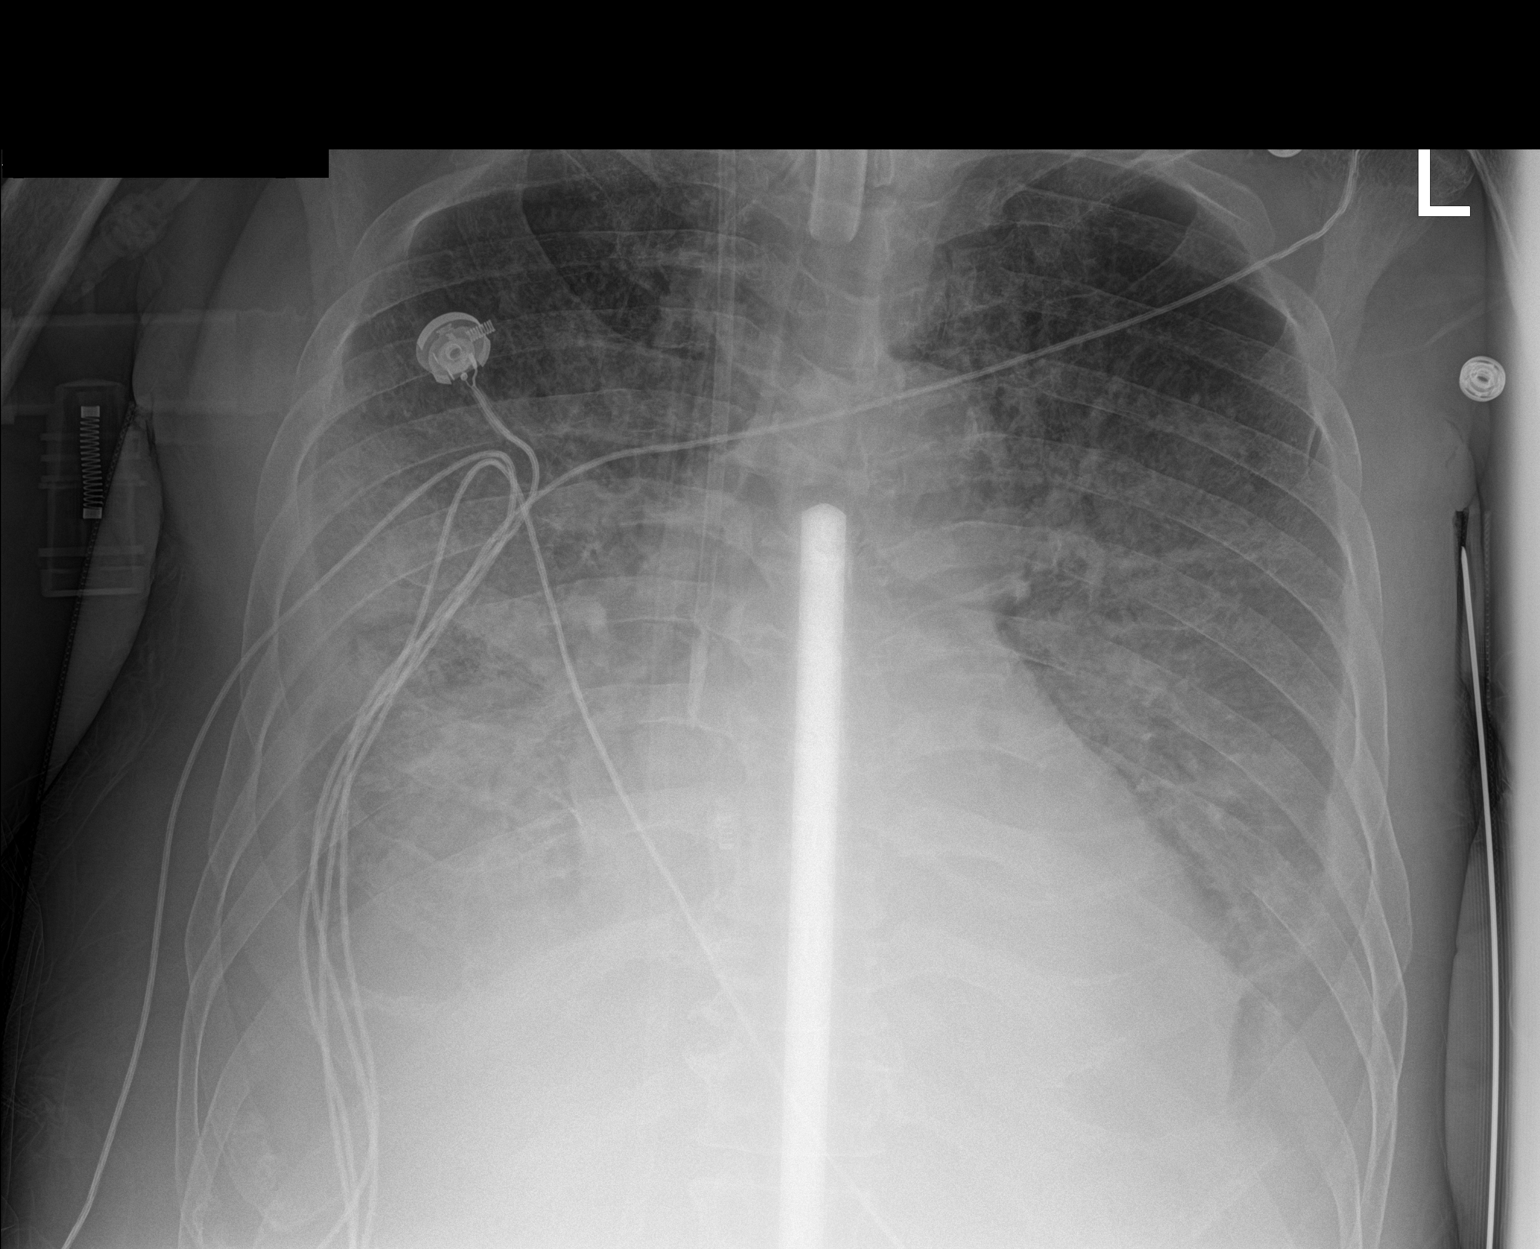

[1 of 1 positions shown; findings below may reference images not displayed]

FINDINGS: Stable cardiomediastinal silhouette. Tracheostomy tube is unchanged
position. Right internal jugular catheter is unchanged in position.
No pneumothorax is noted. Stable bilateral lung opacities are noted
concerning for edema or pneumonia with associated pleural effusions.
Bony thorax is unremarkable.
IMPRESSION: Stable support apparatus. Stable bilateral lung opacities and
pleural effusions as described above.

## 2020-08-14 IMAGING — DX DG CHEST 1V PORT
2 series · 2 of 2 positions shown · non-contrast
Comparison: 02/24/2019

CLINICAL DATA: Shortness of breath

EXAM:
PORTABLE CHEST 1 VIEW

[chest ap (1 of 2)]
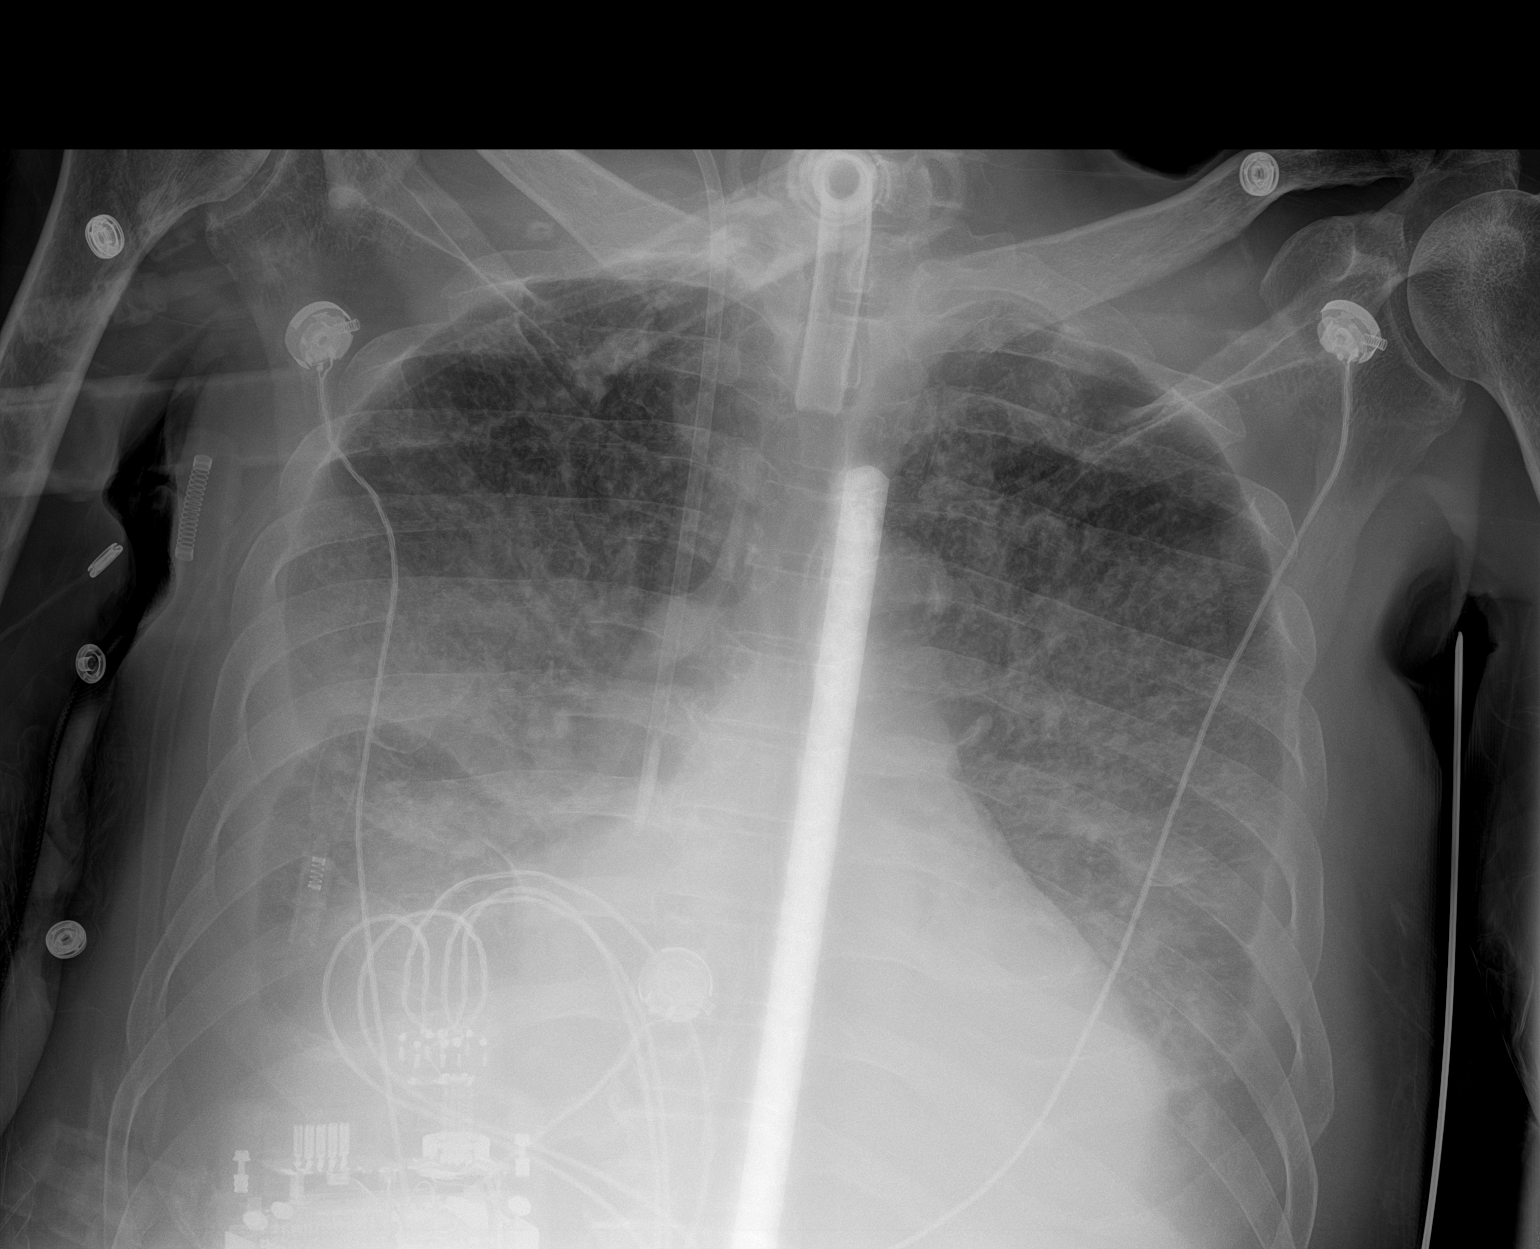

[chest ap (2 of 2)]
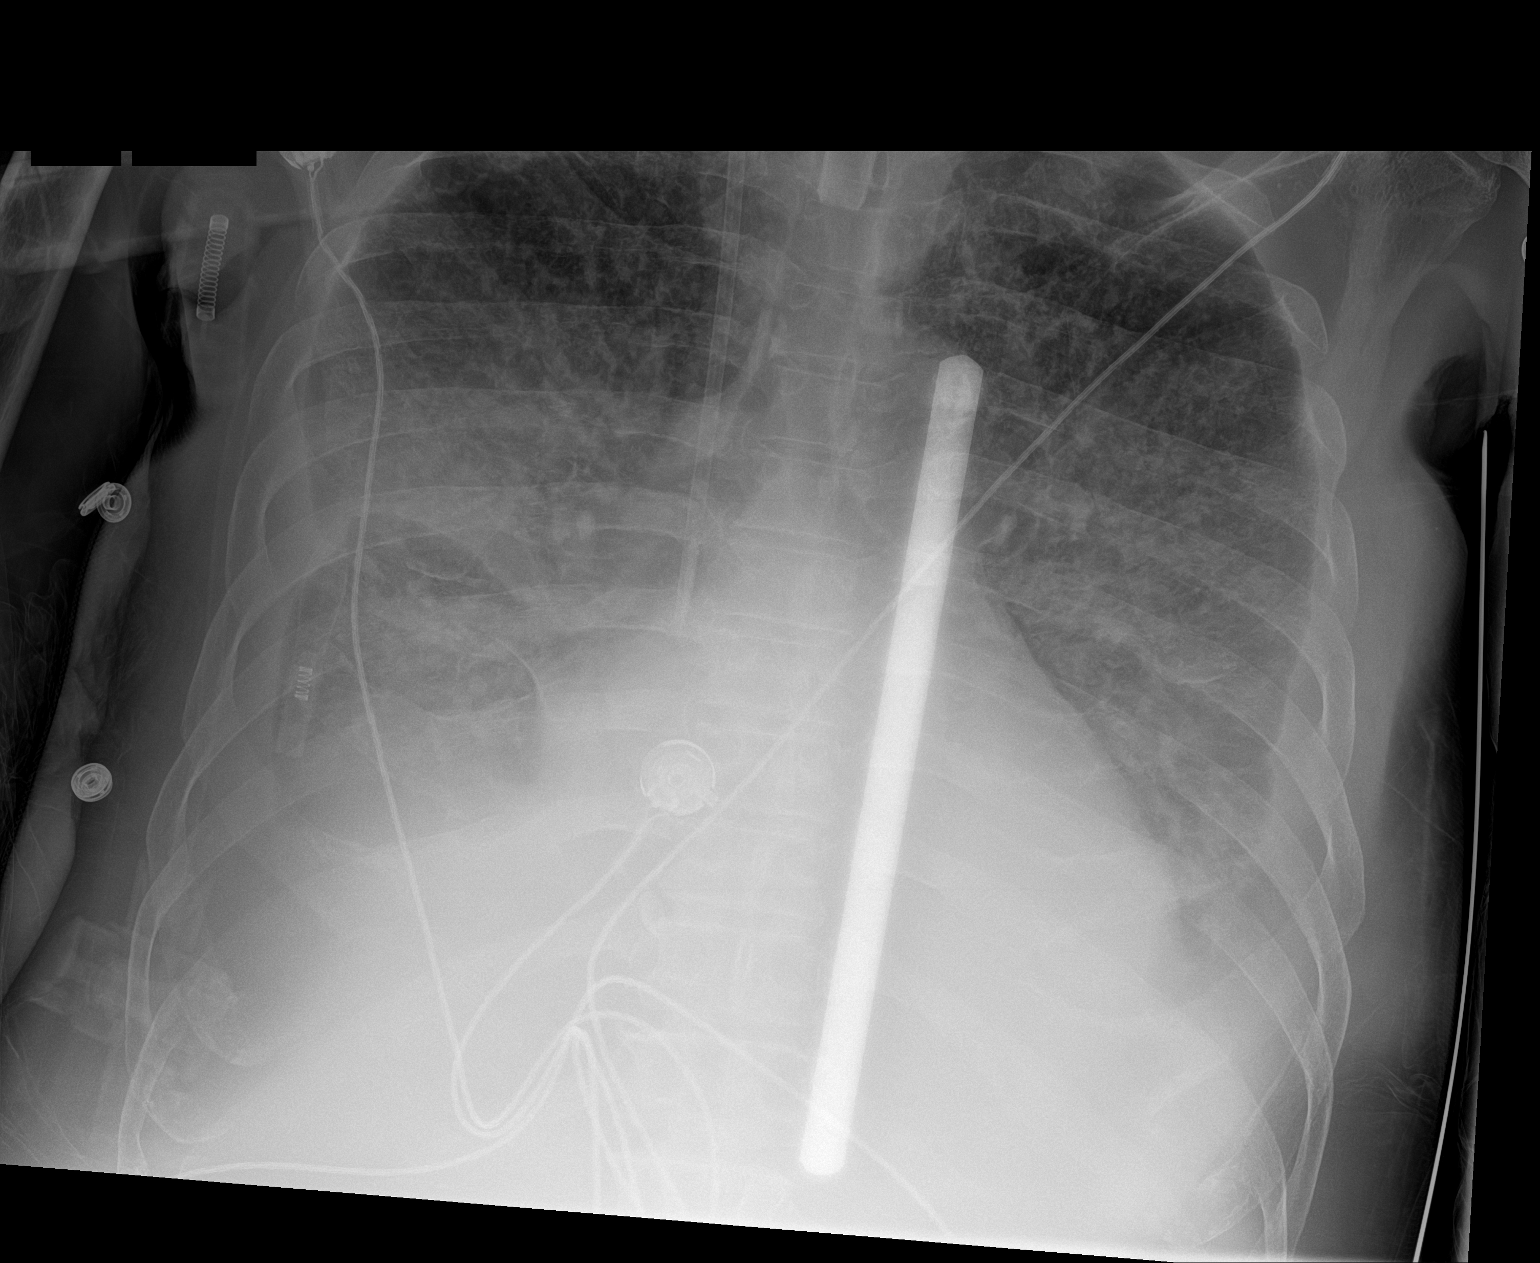

[2 of 2 positions shown; findings below may reference images not displayed]

FINDINGS: Diffuse bilateral airspace disease and moderate layering effusions,
similar to prior study. Tracheostomy and right Vas-Cath unchanged.
IMPRESSION: No significant change diffuse bilateral airspace disease and
effusions.

## 2020-08-23 IMAGING — DX DG CHEST 1V PORT
1 series · 1 of 1 positions shown · non-contrast
Comparison: CT 03/10/2019.  Chest x-ray 03/08/2019.

CLINICAL DATA: Tracheostomy.  Pleural effusion.

EXAM:
PORTABLE CHEST 1 VIEW

[chest ap]
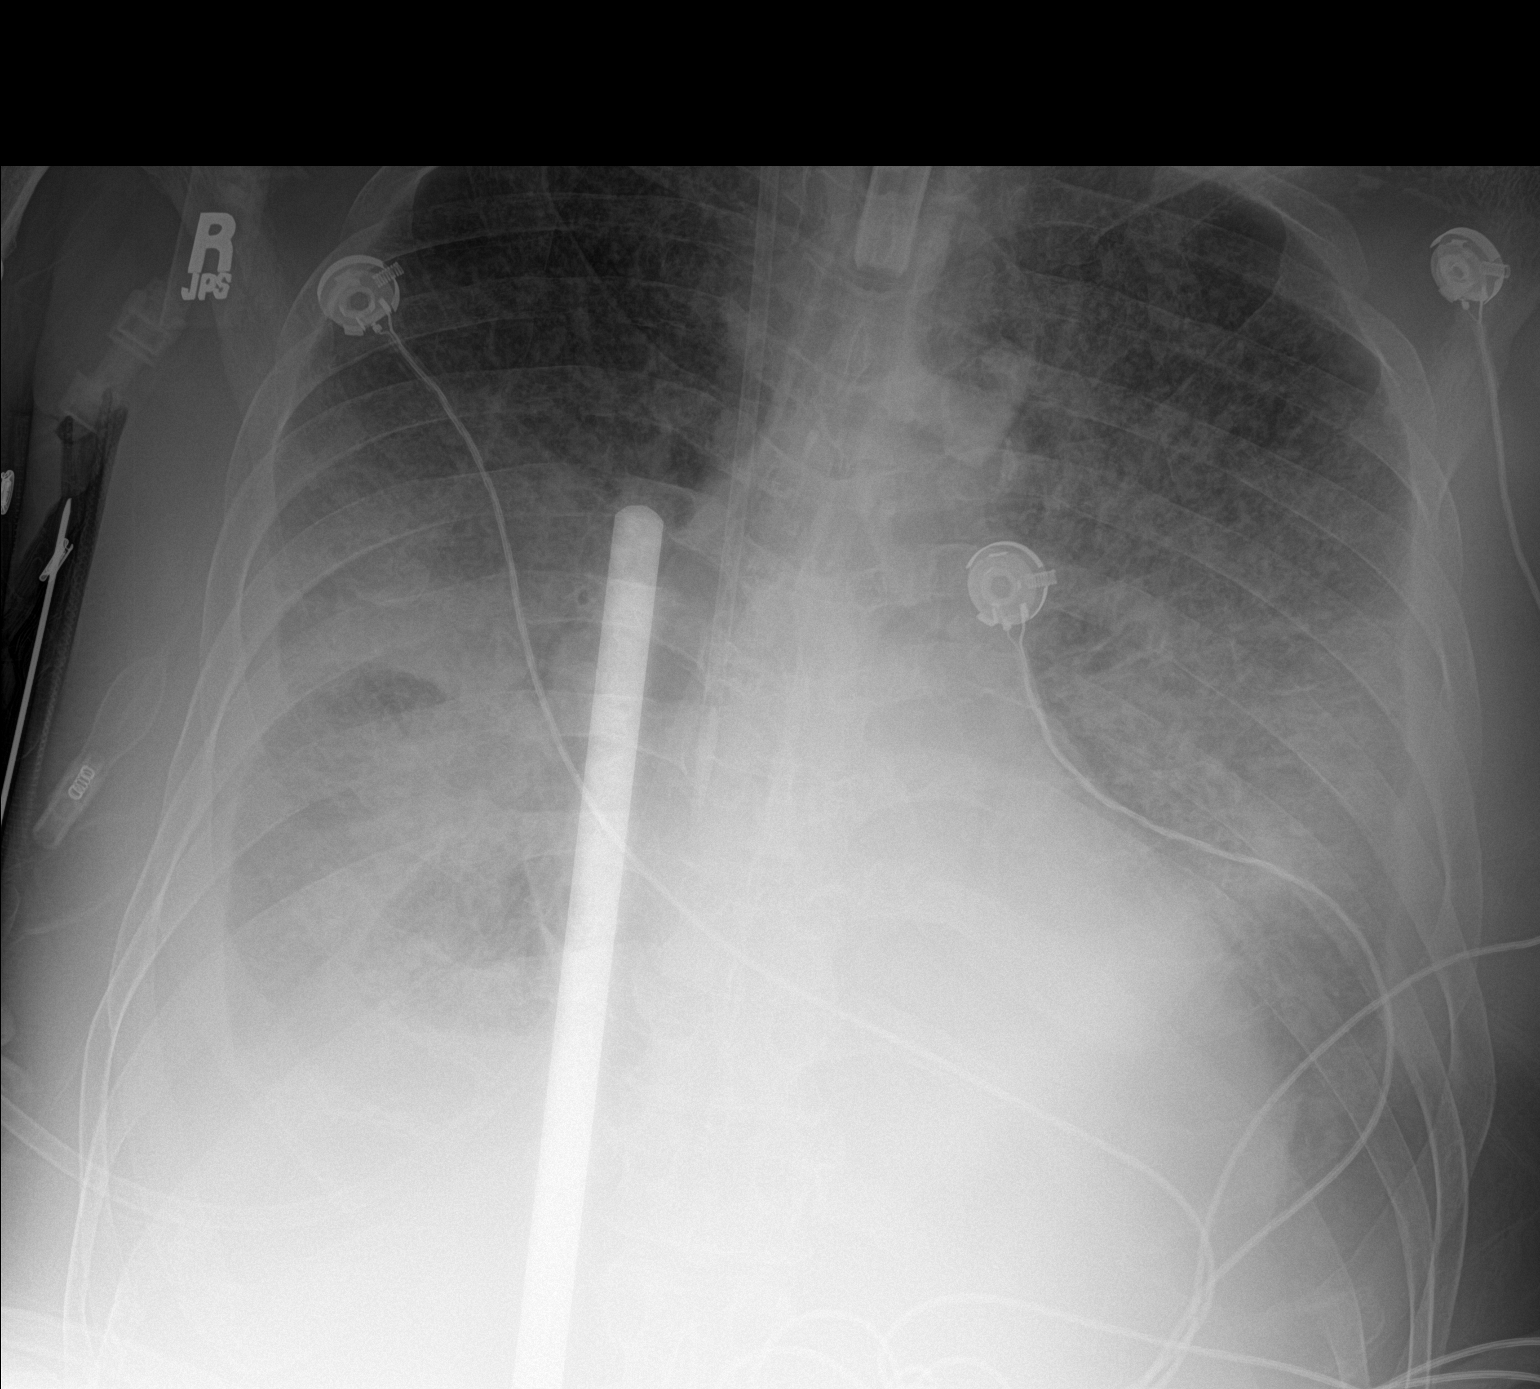

[1 of 1 positions shown; findings below may reference images not displayed]

FINDINGS: Tracheostomy tube and right IJ line stable position. Heart size
stable. Diffuse severe bilateral pulmonary infiltrates/edema again
noted. Bilateral pleural effusions again noted. No pneumothorax
noted as previously questioned on recent chest CT. Continued
follow-up exams suggested for continued evaluation.
IMPRESSION: 1.  Tracheostomy tube and right IJ line stable position.

2. Diffuse severe bilateral pulmonary infiltrates/edema and
bilateral pleural effusions again noted without interim change.

3. No pneumothorax noted as previously questioned on recent chest
CT. Continued follow-up chest x-rays suggested for continued
evaluation.

## 2020-08-23 IMAGING — US US THORACENTESIS ASP PLEURAL SPACE W/IMG GUIDE
1 series · 6 of 6 positions shown · non-contrast
Comparison: none

INDICATION: Acute on chronic respiratory failure. Bilateral pleural effusions.
Request for diagnostic and therapeutic left thoracentesis.

[Series 1: us thoracentesis asp pleural space w/img guide · 6 of 6 slices shown]
[im 1/6]
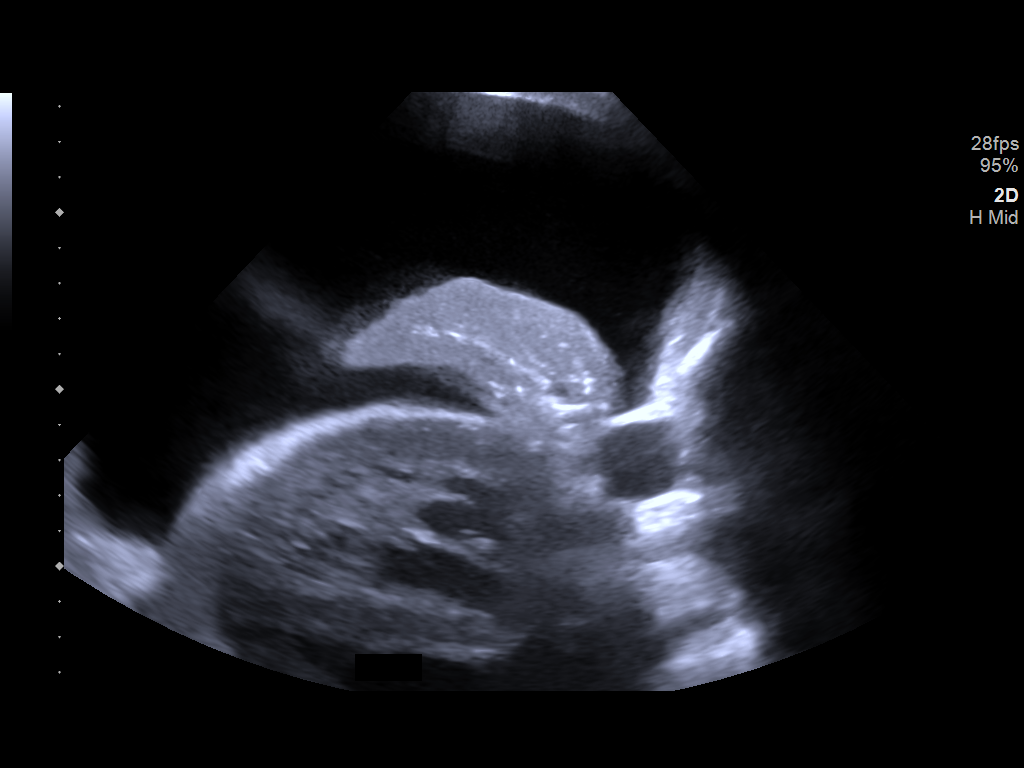
[im 2/6]
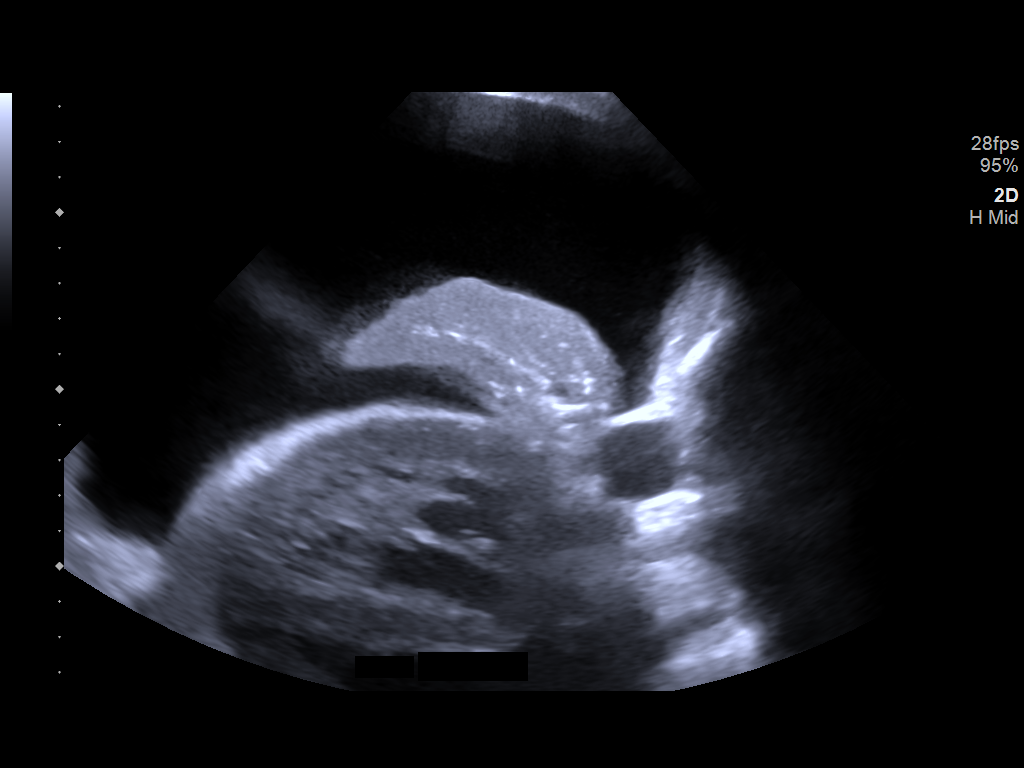
[im 3/6]
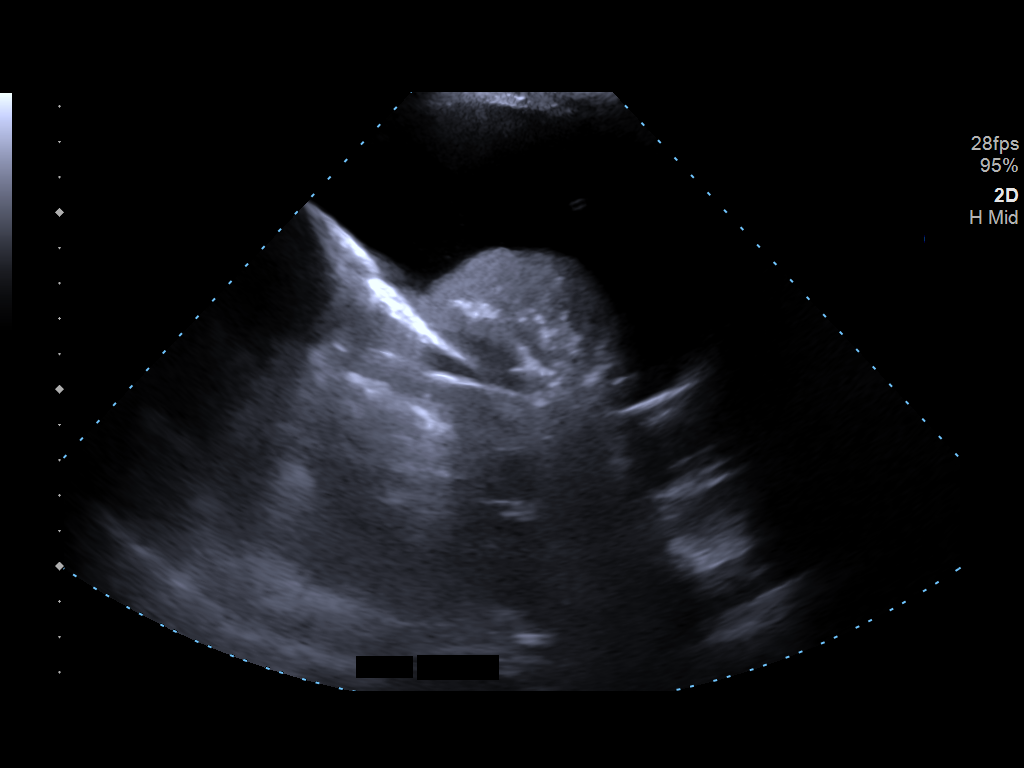
[im 4/6]
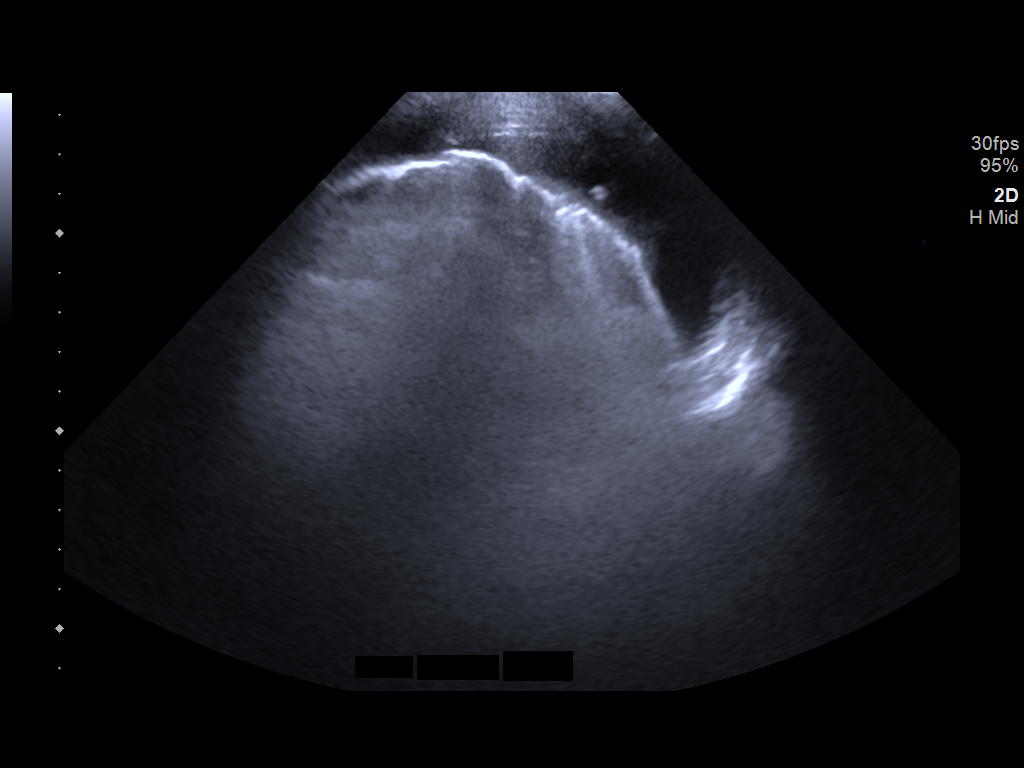
[im 5/6]
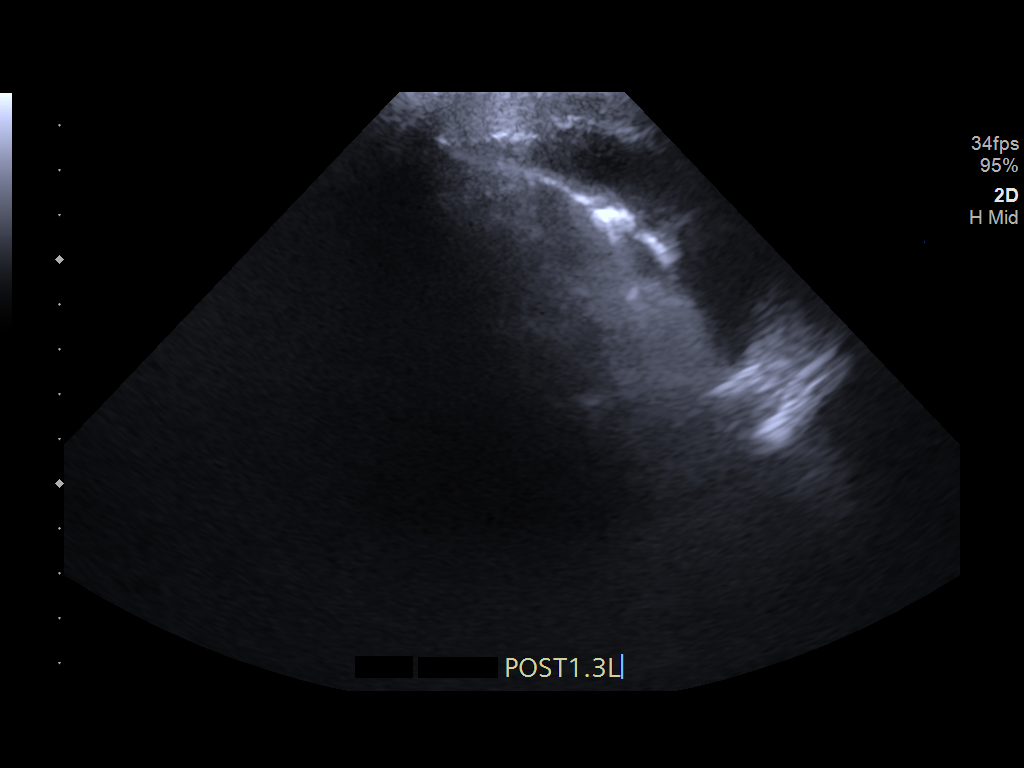
[im 6/6]
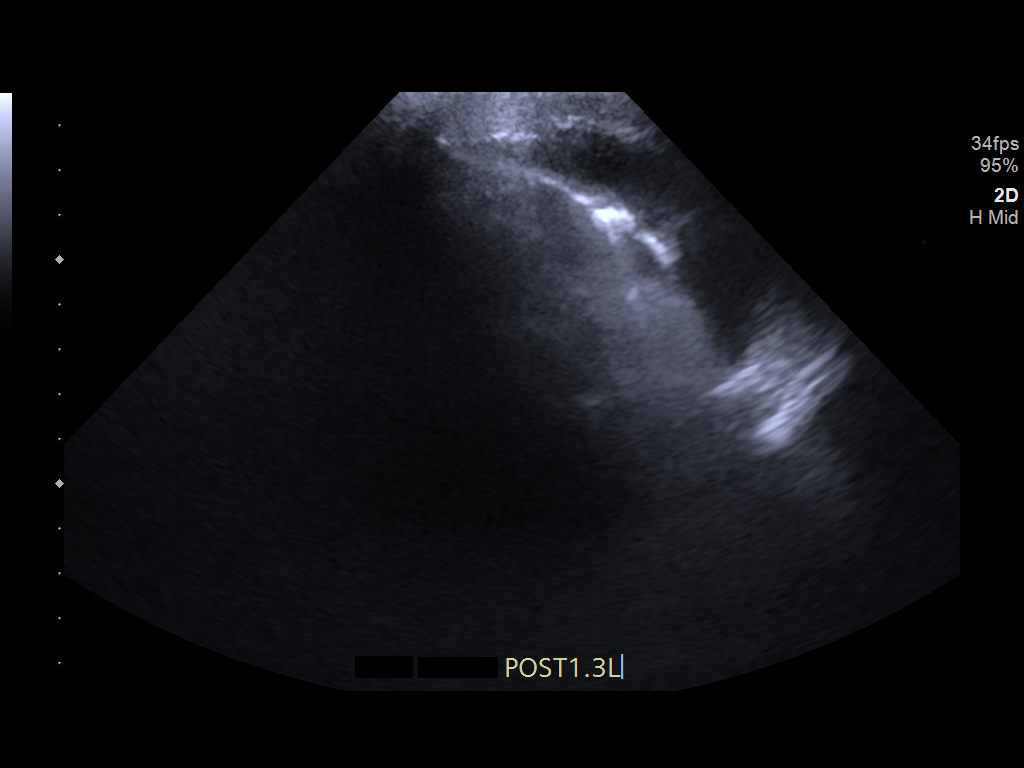

[6 of 6 positions shown; findings below may reference images not displayed]

EXAM:
ULTRASOUND GUIDED LEFT THORACENTESIS

MEDICATIONS:
1% lidocaine 10 mL

COMPLICATIONS:
None immediate.

PROCEDURE:
An ultrasound guided thoracentesis was thoroughly discussed with the
patient and questions answered. The benefits, risks, alternatives
and complications were also discussed. The patient understands and
wishes to proceed with the procedure. Written consent was obtained.

Ultrasound was performed to localize and mark an adequate pocket of
fluid in the left chest. The area was then prepped and draped in the
normal sterile fashion. 1% Lidocaine was used for local anesthesia.
Under ultrasound guidance a 6 Fr Safe-T-Centesis catheter was
introduced. Thoracentesis was performed. The catheter was removed
and a dressing applied.
FINDINGS: A total of approximately 1.35 L of clear amber fluid was removed.
Samples were sent to the laboratory as requested by the clinical
team.
IMPRESSION: Successful ultrasound guided left thoracentesis yielding 1.35 L of
pleural fluid.

No pneumothorax on post-procedure chest x-ray.
